# Patient Record
Sex: Female | Born: 1950 | Race: White | Hispanic: No | Smoking: Never smoker
Health system: Southern US, Community
[De-identification: ages and names within clinical notes are randomized; demographics above are authoritative.]

## PROBLEM LIST (undated history)

## (undated) DIAGNOSIS — Z973 Presence of spectacles and contact lenses: Secondary | ICD-10-CM

## (undated) DIAGNOSIS — F32A Depression, unspecified: Secondary | ICD-10-CM

## (undated) DIAGNOSIS — Z8489 Family history of other specified conditions: Secondary | ICD-10-CM

## (undated) DIAGNOSIS — R7303 Prediabetes: Secondary | ICD-10-CM

## (undated) DIAGNOSIS — C439 Malignant melanoma of skin, unspecified: Secondary | ICD-10-CM

## (undated) DIAGNOSIS — I10 Essential (primary) hypertension: Secondary | ICD-10-CM

## (undated) DIAGNOSIS — F329 Major depressive disorder, single episode, unspecified: Secondary | ICD-10-CM

## (undated) DIAGNOSIS — M8580 Other specified disorders of bone density and structure, unspecified site: Secondary | ICD-10-CM

## (undated) DIAGNOSIS — K219 Gastro-esophageal reflux disease without esophagitis: Secondary | ICD-10-CM

## (undated) DIAGNOSIS — M1990 Unspecified osteoarthritis, unspecified site: Secondary | ICD-10-CM

## (undated) DIAGNOSIS — C801 Malignant (primary) neoplasm, unspecified: Secondary | ICD-10-CM

## (undated) DIAGNOSIS — N950 Postmenopausal bleeding: Secondary | ICD-10-CM

## (undated) DIAGNOSIS — N390 Urinary tract infection, site not specified: Secondary | ICD-10-CM

## (undated) DIAGNOSIS — G4733 Obstructive sleep apnea (adult) (pediatric): Secondary | ICD-10-CM

## (undated) HISTORY — PX: COLONOSCOPY: SHX174

## (undated) HISTORY — DX: Other specified disorders of bone density and structure, unspecified site: M85.80

## (undated) HISTORY — DX: Malignant (primary) neoplasm, unspecified: C80.1

## (undated) HISTORY — DX: Major depressive disorder, single episode, unspecified: F32.9

## (undated) HISTORY — PX: TONSILLECTOMY: SUR1361

## (undated) HISTORY — DX: Malignant melanoma of skin, unspecified: C43.9

## (undated) HISTORY — DX: Depression, unspecified: F32.A

## (undated) HISTORY — PX: REFRACTIVE SURGERY: SHX103

## (undated) HISTORY — DX: Essential (primary) hypertension: I10

## (undated) HISTORY — DX: Obstructive sleep apnea (adult) (pediatric): G47.33

## (undated) HISTORY — PX: BREAST BIOPSY: SHX20

## (undated) HISTORY — DX: Gastro-esophageal reflux disease without esophagitis: K21.9

## (undated) HISTORY — PX: WISDOM TOOTH EXTRACTION: SHX21

## (undated) HISTORY — DX: Prediabetes: R73.03

## (undated) HISTORY — DX: Urinary tract infection, site not specified: N39.0

---

## 1999-09-25 ENCOUNTER — Other Ambulatory Visit: Admission: RE | Admit: 1999-09-25 | Discharge: 1999-09-25 | Payer: Self-pay | Admitting: *Deleted

## 2001-04-30 ENCOUNTER — Other Ambulatory Visit: Admission: RE | Admit: 2001-04-30 | Discharge: 2001-04-30 | Payer: Self-pay | Admitting: *Deleted

## 2002-05-28 ENCOUNTER — Other Ambulatory Visit: Admission: RE | Admit: 2002-05-28 | Discharge: 2002-05-28 | Payer: Self-pay | Admitting: *Deleted

## 2002-08-31 LAB — HM COLONOSCOPY: HM Colonoscopy: NEGATIVE

## 2003-06-02 ENCOUNTER — Other Ambulatory Visit: Admission: RE | Admit: 2003-06-02 | Discharge: 2003-06-02 | Payer: Self-pay | Admitting: Obstetrics and Gynecology

## 2004-01-08 ENCOUNTER — Emergency Department (HOSPITAL_COMMUNITY): Admission: EM | Admit: 2004-01-08 | Discharge: 2004-01-08 | Payer: Self-pay | Admitting: Emergency Medicine

## 2004-07-05 ENCOUNTER — Other Ambulatory Visit: Admission: RE | Admit: 2004-07-05 | Discharge: 2004-07-05 | Payer: Self-pay | Admitting: Obstetrics and Gynecology

## 2004-10-29 HISTORY — PX: MELANOMA EXCISION: SHX5266

## 2004-12-22 ENCOUNTER — Encounter: Admission: RE | Admit: 2004-12-22 | Discharge: 2004-12-22 | Payer: Self-pay | Admitting: *Deleted

## 2005-06-22 ENCOUNTER — Encounter: Admission: RE | Admit: 2005-06-22 | Discharge: 2005-06-22 | Payer: Self-pay | Admitting: *Deleted

## 2005-07-09 ENCOUNTER — Other Ambulatory Visit: Admission: RE | Admit: 2005-07-09 | Discharge: 2005-07-09 | Payer: Self-pay | Admitting: Obstetrics and Gynecology

## 2005-07-20 ENCOUNTER — Encounter: Admission: RE | Admit: 2005-07-20 | Discharge: 2005-07-20 | Payer: Self-pay | Admitting: Internal Medicine

## 2005-09-12 ENCOUNTER — Encounter: Admission: RE | Admit: 2005-09-12 | Discharge: 2005-09-12 | Payer: Self-pay | Admitting: Dermatology

## 2006-08-01 ENCOUNTER — Other Ambulatory Visit: Admission: RE | Admit: 2006-08-01 | Discharge: 2006-08-01 | Payer: Self-pay | Admitting: Obstetrics & Gynecology

## 2006-08-07 ENCOUNTER — Encounter: Admission: RE | Admit: 2006-08-07 | Discharge: 2006-08-07 | Payer: Self-pay | Admitting: Obstetrics and Gynecology

## 2007-08-06 ENCOUNTER — Other Ambulatory Visit: Admission: RE | Admit: 2007-08-06 | Discharge: 2007-08-06 | Payer: Self-pay | Admitting: Obstetrics and Gynecology

## 2007-08-11 ENCOUNTER — Encounter: Admission: RE | Admit: 2007-08-11 | Discharge: 2007-08-11 | Payer: Self-pay | Admitting: Obstetrics and Gynecology

## 2008-08-23 ENCOUNTER — Encounter: Admission: RE | Admit: 2008-08-23 | Discharge: 2008-08-23 | Payer: Self-pay | Admitting: Obstetrics and Gynecology

## 2008-09-02 ENCOUNTER — Encounter: Admission: RE | Admit: 2008-09-02 | Discharge: 2008-09-02 | Payer: Self-pay | Admitting: Obstetrics and Gynecology

## 2008-09-28 ENCOUNTER — Other Ambulatory Visit: Admission: RE | Admit: 2008-09-28 | Discharge: 2008-09-28 | Payer: Self-pay | Admitting: Obstetrics and Gynecology

## 2008-12-14 ENCOUNTER — Encounter: Admission: RE | Admit: 2008-12-14 | Discharge: 2008-12-14 | Payer: Self-pay | Admitting: Obstetrics and Gynecology

## 2009-09-14 ENCOUNTER — Encounter: Admission: RE | Admit: 2009-09-14 | Discharge: 2009-09-14 | Payer: Self-pay | Admitting: Obstetrics and Gynecology

## 2010-11-19 ENCOUNTER — Encounter: Payer: Self-pay | Admitting: Obstetrics and Gynecology

## 2010-11-21 ENCOUNTER — Other Ambulatory Visit: Payer: Self-pay | Admitting: Obstetrics and Gynecology

## 2010-11-21 DIAGNOSIS — Z1239 Encounter for other screening for malignant neoplasm of breast: Secondary | ICD-10-CM

## 2010-11-21 DIAGNOSIS — Z1231 Encounter for screening mammogram for malignant neoplasm of breast: Secondary | ICD-10-CM

## 2010-12-01 LAB — HM MAMMOGRAPHY: HM Mammogram: NEGATIVE

## 2010-12-07 ENCOUNTER — Ambulatory Visit
Admission: RE | Admit: 2010-12-07 | Discharge: 2010-12-07 | Disposition: A | Discharge status: Home / Self Care | Payer: BC Managed Care – PPO | Source: Ambulatory Visit | Attending: Obstetrics and Gynecology | Admitting: Obstetrics and Gynecology

## 2010-12-07 DIAGNOSIS — Z1231 Encounter for screening mammogram for malignant neoplasm of breast: Secondary | ICD-10-CM

## 2012-01-30 LAB — HM PAP SMEAR: HM Pap smear: NEGATIVE

## 2012-02-04 ENCOUNTER — Other Ambulatory Visit: Payer: Self-pay | Admitting: Obstetrics and Gynecology

## 2012-02-04 DIAGNOSIS — Z1231 Encounter for screening mammogram for malignant neoplasm of breast: Secondary | ICD-10-CM

## 2012-02-04 DIAGNOSIS — M8580 Other specified disorders of bone density and structure, unspecified site: Secondary | ICD-10-CM

## 2012-02-27 ENCOUNTER — Other Ambulatory Visit: Payer: BC Managed Care – PPO

## 2012-02-27 ENCOUNTER — Ambulatory Visit: Payer: BC Managed Care – PPO

## 2012-03-31 ENCOUNTER — Ambulatory Visit (INDEPENDENT_AMBULATORY_CARE_PROVIDER_SITE_OTHER): Payer: BC Managed Care – PPO | Admitting: Family Medicine

## 2012-03-31 ENCOUNTER — Encounter: Payer: Self-pay | Admitting: Family Medicine

## 2012-03-31 VITALS — BP 160/80 | HR 72 | Temp 98.7°F | Resp 12 | Ht 65.25 in | Wt 148.0 lb

## 2012-03-31 DIAGNOSIS — R030 Elevated blood-pressure reading, without diagnosis of hypertension: Secondary | ICD-10-CM

## 2012-03-31 DIAGNOSIS — Z8659 Personal history of other mental and behavioral disorders: Secondary | ICD-10-CM | POA: Insufficient documentation

## 2012-03-31 DIAGNOSIS — IMO0001 Reserved for inherently not codable concepts without codable children: Secondary | ICD-10-CM

## 2012-03-31 DIAGNOSIS — I10 Essential (primary) hypertension: Secondary | ICD-10-CM | POA: Insufficient documentation

## 2012-03-31 DIAGNOSIS — H81319 Aural vertigo, unspecified ear: Secondary | ICD-10-CM

## 2012-03-31 DIAGNOSIS — H8110 Benign paroxysmal vertigo, unspecified ear: Secondary | ICD-10-CM

## 2012-03-31 DIAGNOSIS — C439 Malignant melanoma of skin, unspecified: Secondary | ICD-10-CM | POA: Insufficient documentation

## 2012-03-31 NOTE — Progress Notes (Signed)
  Subjective:    Patient ID: Lori Harvey, female    DOB: [birth year], 61 y.o.   MRN: [id number]  HPI  Patient is seen to establish care. She sees gynecologist regularly for physicals. Past medical history reviewed. She's had previous episode or depression several years ago currently untreated and stable. She has history of borderline elevated blood pressure readings but mostly below 130/80 by home readings. Never treated with medication. She had recurrent episodes of vertigo for several years.  This is usually very transient. Patient has some nausea but no vomiting. No hearing changes. No tinnitus.  Patient has history of melanoma stage II right calf 2006. Regular dermatology followup.  Patient is widowed. Her husband passed away from leukemia complications 2 years ago. Nonsmoker. Occasional alcohol use. Patient retired 2006. Colonoscopy screening 2003. Tetanus 2004. Shingles vaccine 2 years ago.  Family history significant for followup alcoholism. Mother had breast cancer and history of stroke  Past Medical History  Diagnosis Date  . Cancer     right leg, upper thigh  . Depression   . Hypertension   . UTI (lower urinary tract infection)    History reviewed. No pertinent past surgical history.  reports that she has never smoked. She does not have any smokeless tobacco history on file. Her alcohol and drug histories not on file. family history includes Alcohol abuse in her father; Cancer in her mother; and Stroke in her mother. Allergies  Allergen Reactions  . Keflex (Cephalexin)     Nausea, hives      Review of Systems  Constitutional: Negative for appetite change and unexpected weight change.  Respiratory: Negative for cough and shortness of breath.   Cardiovascular: Negative for chest pain, palpitations and leg swelling.  Gastrointestinal: Negative for abdominal pain.  Genitourinary: Negative for dysuria.  Neurological: Negative for dizziness, weakness and headaches.    Hematological: Negative for adenopathy. Does not bruise/bleed easily.  Psychiatric/Behavioral: Negative for confusion and dysphoric mood.       Objective:   Physical Exam  Constitutional: She is oriented to person, place, and time. She appears well-developed and well-nourished. No distress.  HENT:  Right Ear: External ear normal.  Left Ear: External ear normal.  Mouth/Throat: Oropharynx is clear and moist.  Neck: Neck supple.       No carotid bruits  Cardiovascular: Normal rate and regular rhythm.   No murmur heard. Pulmonary/Chest: Effort normal and breath sounds normal. No respiratory distress. She has no wheezes. She has no rales.  Musculoskeletal: She exhibits no edema.  Neurological: She is alert and oriented to person, place, and time.          Assessment & Plan:  #1 prior history of melanoma. Followed closely by dermatology #2 history of recurrent vertigo. No concerning red flag symptoms. Currently asymptomatic. Consider vestibular rehabilitation for any further future occurrences #3 remote history of depression currently stable off medications #4 history of borderline elevated blood pressure mostly below 130 /80 by home readings

## 2012-04-08 ENCOUNTER — Encounter: Payer: Self-pay | Admitting: Family Medicine

## 2012-04-08 ENCOUNTER — Ambulatory Visit (INDEPENDENT_AMBULATORY_CARE_PROVIDER_SITE_OTHER): Payer: BC Managed Care – PPO | Admitting: Family Medicine

## 2012-04-08 VITALS — BP 140/82 | Temp 98.6°F | Wt 148.0 lb

## 2012-04-08 DIAGNOSIS — R05 Cough: Secondary | ICD-10-CM

## 2012-04-08 DIAGNOSIS — H109 Unspecified conjunctivitis: Secondary | ICD-10-CM

## 2012-04-08 DIAGNOSIS — R059 Cough, unspecified: Secondary | ICD-10-CM

## 2012-04-08 MED ORDER — POLYMYXIN B-TRIMETHOPRIM 10000-0.1 UNIT/ML-% OP SOLN: 2.0000 [drp] | OPHTHALMIC | Status: AC

## 2012-04-08 MED ORDER — LEVOFLOXACIN 500 MG PO TABS: 500.0000 mg | ORAL_TABLET | Freq: Every day | ORAL | Status: AC

## 2012-04-08 NOTE — Patient Instructions (Signed)
Conjunctivitis Conjunctivitis is commonly called "pink eye." Conjunctivitis can be caused by bacterial or viral infection, allergies, or injuries. There is usually redness of the lining of the eye, itching, discomfort, and sometimes discharge. There may be deposits of matter along the eyelids. A viral infection usually causes a watery discharge, while a bacterial infection causes a yellowish, thick discharge. Pink eye is very contagious and spreads by direct contact. You may be given antibiotic eyedrops as part of your treatment. Before using your eye medicine, remove all drainage from the eye by washing gently with warm water and cotton balls. Continue to use the medication until you have awakened 2 mornings in a row without discharge from the eye. Do not rub your eye. This increases the irritation and helps spread infection. Use separate towels from other household members. Wash your hands with soap and water before and after touching your eyes. Use cold compresses to reduce pain and sunglasses to relieve irritation from light. Do not wear contact lenses or wear eye makeup until the infection is gone. SEEK MEDICAL CARE IF:   Your symptoms are not better after 3 days of treatment.   You have increased pain or trouble seeing.   The outer eyelids become very red or swollen.  Document Released: 11/22/2004 Document Revised: 10/04/2011 Document Reviewed: 10/15/2005 Exit[employer] Patient Information 2012 [employer]. 

## 2012-04-08 NOTE — Progress Notes (Signed)
  Subjective:    Patient ID: Lori Harvey, female    DOB: May 17, 1951, 61 y.o.   MRN: [id number]  HPI  Upper respiratory symptoms. Patient had illness back in April very similar with sinus congestion /cough. She went to urgent care was given Zithromax and steroid injection. She still is not quite clear. She has had productive cough but no fever. She has new symptom of both eyes matting and red over the past couple days. No visual changes.   Review of Systems  Constitutional: Negative for fever and chills.  HENT: Positive for congestion and sinus pressure.   Eyes: Positive for discharge and redness. Negative for photophobia, pain and visual disturbance.  Respiratory: Positive for cough. Negative for wheezing.        Objective:   Physical Exam  Constitutional: She appears well-developed and well-nourished.  HENT:  Right Ear: External ear normal.  Left Ear: External ear normal.  Mouth/Throat: Oropharynx is clear and moist.  Eyes:       Conjunctiva are erythematous bilaterally.  Neck: Neck supple.  Cardiovascular: Normal rate and regular rhythm.   Pulmonary/Chest: Effort normal and breath sounds normal. No respiratory distress. She has no wheezes. She has no rales.  Lymphadenopathy:    She has no cervical adenopathy.          Assessment & Plan:  #1 bacterial conjunctivitis. Bilateral. Warm compresses. Polytrim ophthalmic drops every 4 hours while awake #2 cough. Question persistent sinusitis versus viral process. Continue over-the-counter medications as Mucinex. No antibiotics recommend this time but if she has any fever or persistent or worsening symptoms start Levaquin 500 milligrams once daily for 7 days

## 2012-06-18 ENCOUNTER — Encounter: Payer: Self-pay | Admitting: Internal Medicine

## 2012-08-13 ENCOUNTER — Encounter: Payer: Self-pay | Admitting: Internal Medicine

## 2012-09-17 ENCOUNTER — Ambulatory Visit (AMBULATORY_SURGERY_CENTER): Payer: BC Managed Care – PPO | Admitting: *Deleted

## 2012-09-17 VITALS — Ht 66.5 in | Wt 151.0 lb

## 2012-09-17 DIAGNOSIS — Z1211 Encounter for screening for malignant neoplasm of colon: Secondary | ICD-10-CM

## 2012-09-17 MED ORDER — MOVIPREP 100 G PO SOLR: ORAL | Status: DC

## 2012-09-18 ENCOUNTER — Encounter: Payer: Self-pay | Admitting: Internal Medicine

## 2012-10-07 ENCOUNTER — Encounter: Payer: BC Managed Care – PPO | Admitting: Internal Medicine

## 2012-10-15 ENCOUNTER — Encounter: Payer: BC Managed Care – PPO | Admitting: Internal Medicine

## 2012-10-15 ENCOUNTER — Ambulatory Visit
Admission: RE | Admit: 2012-10-15 | Discharge: 2012-10-15 | Disposition: A | Discharge status: Home / Self Care | Payer: BC Managed Care – PPO | Source: Ambulatory Visit | Attending: Obstetrics and Gynecology | Admitting: Obstetrics and Gynecology

## 2012-10-15 DIAGNOSIS — Z1231 Encounter for screening mammogram for malignant neoplasm of breast: Secondary | ICD-10-CM

## 2012-10-15 DIAGNOSIS — M8580 Other specified disorders of bone density and structure, unspecified site: Secondary | ICD-10-CM

## 2012-10-28 ENCOUNTER — Other Ambulatory Visit: Payer: Self-pay | Admitting: Obstetrics and Gynecology

## 2012-10-28 DIAGNOSIS — R928 Other abnormal and inconclusive findings on diagnostic imaging of breast: Secondary | ICD-10-CM

## 2012-10-31 ENCOUNTER — Ambulatory Visit
Admission: RE | Admit: 2012-10-31 | Discharge: 2012-10-31 | Disposition: A | Discharge status: Home / Self Care | Payer: BC Managed Care – PPO | Source: Ambulatory Visit | Attending: Obstetrics and Gynecology | Admitting: Obstetrics and Gynecology

## 2012-10-31 DIAGNOSIS — R928 Other abnormal and inconclusive findings on diagnostic imaging of breast: Secondary | ICD-10-CM

## 2012-11-04 ENCOUNTER — Telehealth: Payer: Self-pay | Admitting: Internal Medicine

## 2012-11-04 NOTE — Telephone Encounter (Signed)
Pt's questions about diet changes were answered.

## 2012-11-11 ENCOUNTER — Ambulatory Visit (AMBULATORY_SURGERY_CENTER): Payer: BC Managed Care – PPO | Admitting: Internal Medicine

## 2012-11-11 ENCOUNTER — Encounter: Payer: Self-pay | Admitting: Internal Medicine

## 2012-11-11 VITALS — BP 127/81 | HR 65 | Temp 97.3°F | Resp 24 | Ht 66.6 in | Wt 151.0 lb

## 2012-11-11 DIAGNOSIS — Z1211 Encounter for screening for malignant neoplasm of colon: Secondary | ICD-10-CM

## 2012-11-11 MED ORDER — SODIUM CHLORIDE 0.9 % IV SOLN: 500.0000 mL | INTRAVENOUS | Status: DC

## 2012-11-11 NOTE — Progress Notes (Signed)
Stable to RR 

## 2012-11-11 NOTE — Progress Notes (Signed)
Patient did not experience any of the following events: a burn prior to discharge; a fall within the facility; wrong site/side/patient/procedure/implant event; or a hospital transfer or hospital admission upon discharge from the facility. (G8907) Patient did not have preoperative order for IV antibiotic SSI prophylaxis. (G8918)  

## 2012-11-11 NOTE — Patient Instructions (Addendum)

## 2012-11-11 NOTE — Op Note (Signed)
 [facility] [address] [place], [address]   COLONOSCOPY PROCEDURE REPORT  PATIENT: Lori Harvey, Lori Harvey  MR#: [id number] BIRTHDATE: Feb 19, 1951 , 61  yrs. old GENDER: Female ENDOSCOPIST: Hart Carwin, MD REFERRED BY:  Evelena Peat, M.D. PROCEDURE DATE:  11/11/2012 PROCEDURE:   Colonoscopy, screening ASA CLASS:   Class II INDICATIONS:Average risk patient for colon cancer and normal colonoscopy in 2003. MEDICATIONS: MAC sedation, administered by CRNA and propofol (Diprivan) 300mg  IV  DESCRIPTION OF PROCEDURE:   After the risks and benefits and of the procedure were explained, informed consent was obtained.  A digital rectal exam revealed no abnormalities of the rectum.    The LB [id number]-[id number]  endoscope was introduced through the anus and advanced to the cecum, which was identified by both the appendix and ileocecal valve .  The quality of the prep was excellent, using MoviPrep .  The instrument was then slowly withdrawn as the colon was fully examined.     COLON FINDINGS: A normal appearing cecum, ileocecal valve, and appendiceal orifice were identified.  The ascending, hepatic flexure, transverse, splenic flexure, descending, sigmoid colon and rectum appeared unremarkable.  No polyps or cancers were seen. Retroflexed views revealed no abnormalities.     The scope was then withdrawn from the patient and the procedure completed.  COMPLICATIONS: There were no complications. ENDOSCOPIC IMPRESSION: Normal colon  RECOMMENDATIONS: High fiber diet   REPEAT EXAM: In 10 year(s)  for Colonoscopy.  cc:  _______________________________ eSignedHart Carwin, MD 11/11/2012 4:16 PM

## 2012-11-11 NOTE — Progress Notes (Signed)
NO EGG OR SOY ALLERGY. EWM 

## 2012-11-12 ENCOUNTER — Telehealth: Payer: Self-pay | Admitting: *Deleted

## 2012-11-12 NOTE — Telephone Encounter (Signed)
  Follow up Call-  Call back number 11/11/2012  Post procedure Call Back phone  # [phone]  Permission to leave phone message Yes     Patient questions:  Do you have a fever, pain , or abdominal swelling? no Pain Score  0 *  Have you tolerated food without any problems? yes  Have you been able to return to your normal activities? yes  Do you have any questions about your discharge instructions: Diet   no Medications  no Follow up visit  no  Do you have questions or concerns about your Care? no  Actions: * If pain score is 4 or above: No action needed, pain <4.

## 2013-01-21 ENCOUNTER — Encounter: Payer: Self-pay | Admitting: *Deleted

## 2013-02-05 ENCOUNTER — Ambulatory Visit (INDEPENDENT_AMBULATORY_CARE_PROVIDER_SITE_OTHER): Payer: BC Managed Care – PPO | Admitting: Nurse Practitioner

## 2013-02-05 ENCOUNTER — Encounter: Payer: Self-pay | Admitting: Nurse Practitioner

## 2013-02-05 VITALS — BP 120/70 | HR 60 | Resp 16 | Ht 66.0 in | Wt 150.0 lb

## 2013-02-05 DIAGNOSIS — G4700 Insomnia, unspecified: Secondary | ICD-10-CM

## 2013-02-05 DIAGNOSIS — Z0000 Encounter for general adult medical examination without abnormal findings: Secondary | ICD-10-CM

## 2013-02-05 DIAGNOSIS — Z23 Encounter for immunization: Secondary | ICD-10-CM

## 2013-02-05 DIAGNOSIS — Z01419 Encounter for gynecological examination (general) (routine) without abnormal findings: Secondary | ICD-10-CM

## 2013-02-05 LAB — TSH: TSH: 2.772 u[IU]/mL (ref 0.350–4.500)

## 2013-02-05 LAB — COMPREHENSIVE METABOLIC PANEL
AST: 14 U/L (ref 0–37)
Albumin: 4.2 g/dL (ref 3.5–5.2)
BUN: 15 mg/dL (ref 6–23)
Calcium: 9.3 mg/dL (ref 8.4–10.5)
Chloride: 103 mEq/L (ref 96–112)
Glucose, Bld: 99 mg/dL (ref 70–99)
Potassium: 4.5 mEq/L (ref 3.5–5.3)
Sodium: 139 mEq/L (ref 135–145)
Total Protein: 6.7 g/dL (ref 6.0–8.3)

## 2013-02-05 LAB — LIPID PANEL
Cholesterol: 239 mg/dL — ABNORMAL HIGH (ref 0–200)
VLDL: 18 mg/dL (ref 0–40)

## 2013-02-05 LAB — POCT URINALYSIS DIPSTICK
Bilirubin, UA: NEGATIVE
Blood, UA: NEGATIVE
Glucose, UA: NEGATIVE

## 2013-02-05 LAB — HEMOGLOBIN A1C: Mean Plasma Glucose: 111 mg/dL (ref <117)

## 2013-02-05 MED ORDER — ZOLPIDEM TARTRATE 10 MG PO TABS: 10.0000 mg | ORAL_TABLET | Freq: Every evening | ORAL | Status: DC | PRN

## 2013-02-05 NOTE — Progress Notes (Signed)
62 y.o. Widowed Caucasian Fe here for annual exam.  In general since husbands untimely death she has felt despondent at times. She tries to be very encouraging to other widows and has helped to create environments where they can meet. She often times leeds the group. She has been in counseling with Hospice which has helped.  She now feels the need to seek other counseling to see if that can help better. Her and Elijah Birk were very close.  She is unsure if not feeling 'well' is emotional or physical. Anxious a lot of the time. Sleep is better.  No LMP recorded. Patient is postmenopausal.          Sexually active: no  The current method of family planning is none.    Exercising: no  The patient does not participate in regular exercise at present. Smoker:  no  Health Maintenance: Pap:  2013 normal with neg HR HPV MMG: 10/15/12 ? Mass left breast and recheck in 6 months Colonoscopy: 10/2012 BMD:  10/15/12 osteopenia TDaP:  2004 Labs: Hgb 14.0, U/A normal   reports that she has never smoked. She has never used smokeless tobacco. She reports that she drinks about 0.6 ounces of alcohol per week. She reports that she does not use illicit drugs.  Past Medical History  Diagnosis Date  . Depression   . UTI (lower urinary tract infection)   . Cancer     right leg, upper thigh AND CALF melanoma  . Hypertension     Patient denies    Past Surgical History  Procedure Laterality Date  . Melanoma excision  2006    right thigh and calf  . Colonoscopy    . Tonsillectomy    . Wisdom tooth extraction    . Refractive surgery      Current Outpatient Prescriptions  Medication Sig Dispense Refill  . doxycycline (VIBRAMYCIN) 50 MG capsule Take 50 mg by mouth 1 day or 1 dose.      . Calcium 1200-1000 MG-UNIT CHEW Chew by mouth.      . Multiple Vitamin (MULTIVITAMIN) capsule Take 1 capsule by mouth daily.       No current facility-administered medications for this visit.    Family History  Problem  Relation Age of Onset  . Cancer Mother     breast  . Stroke Mother   . Alcohol abuse Father   . Colon cancer Neg Hx     ROS:  Pertinent items are noted in HPI.  Otherwise, a comprehensive ROS was negative.  Exam:   BP 120/70  Pulse 60  Resp 16  Ht 5\' 6"  (1.676 m)  Wt 150 lb (68.04 kg)  BMI 24.22 kg/m2  Height: 5\' 6"  (167.6 cm)  Ht Readings from Last 3 Encounters:  02/05/13 5\' 6"  (1.676 m)  11/11/12 5' 6.6" (1.692 m)  09/17/12 5' 6.5" (1.689 m)    General appearance: alert, cooperative and appears stated age Head: Normocephalic, without obvious abnormality, atraumatic Neck: no adenopathy, supple, symmetrical, trachea midline and thyroid normal to inspection and palpation Lungs: clear to auscultation bilaterally Breasts: normal appearance, no masses or tenderness Heart: regular rate and rhythm Abdomen: soft, non-tender; no masses,  no organomegaly Extremities: extremities normal, atraumatic, no cyanosis or edema Skin: Skin color, texture, turgor normal. No rashes or lesions Lymph nodes: Cervical, supraclavicular, and axillary nodes normal. No abnormal inguinal nodes palpated Neurologic: Grossly normal   Pelvic: External genitalia:  no lesions  Urethra:  normal appearing urethra with no masses, tenderness or lesions              Bartholin's and Skene's: normal                 Vagina: normal appearing vagina with atrophy and no discharge, no lesions              Cervix: anteverted              Pap taken: no Bimanual Exam:  Uterus:  normal size, contour, position, consistency, mobility, non-tender              Adnexa: normal adnexa               Rectovaginal: Confirms               Anus:  normal sphincter tone, no lesions  A:  Well Woman with normal exam  Post menopausal  History of abnormal Mammo 10/31/12 with anechoic mass left breast - for repeat in 6 months.  Osteopenia of spine, borderline porosis at hip  P:   Mammogram repeat J/2014 diagnostic  pap  smear as per guidelines  return annually or prn  Business Card for Berniece Andreas, LCSW  An After Visit Summary was printed and given to the patient.

## 2013-02-05 NOTE — Patient Instructions (Addendum)

## 2013-02-06 LAB — VITAMIN D 25 HYDROXY (VIT D DEFICIENCY, FRACTURES): Vit D, 25-Hydroxy: 25 ng/mL — ABNORMAL LOW (ref 30–89)

## 2013-02-07 NOTE — Progress Notes (Signed)
Encounter reviewed by Dr. Brook Silva.  

## 2013-02-16 ENCOUNTER — Encounter: Payer: Self-pay | Admitting: Nurse Practitioner

## 2013-02-17 ENCOUNTER — Other Ambulatory Visit: Payer: Self-pay | Admitting: Nurse Practitioner

## 2013-02-17 ENCOUNTER — Other Ambulatory Visit: Payer: Self-pay | Admitting: *Deleted

## 2013-02-17 DIAGNOSIS — G4700 Insomnia, unspecified: Secondary | ICD-10-CM

## 2013-02-17 MED ORDER — ZOLPIDEM TARTRATE 10 MG PO TABS: 10.0000 mg | ORAL_TABLET | Freq: Every evening | ORAL | Status: DC | PRN

## 2013-09-03 ENCOUNTER — Other Ambulatory Visit: Payer: Self-pay

## 2013-12-26 ENCOUNTER — Telehealth: Payer: Self-pay | Admitting: Emergency Medicine

## 2013-12-26 NOTE — Telephone Encounter (Signed)
This patient is overdue for a 6 month follow up Mammogram at the [facility]. Please contact patient to contact the [facility] to schedule. Thank you.

## 2013-12-30 NOTE — Telephone Encounter (Signed)
I have attempted to contact this patient by phone with the following results: left message to return my call on answering machine (home).  

## 2014-01-01 NOTE — Telephone Encounter (Signed)
Returning a call Colletta [place].

## 2014-01-05 NOTE — Telephone Encounter (Signed)
I have attempted to contact this patient by phone with the following results: left message to return my call on answering machine (mobile).  

## 2014-01-07 ENCOUNTER — Other Ambulatory Visit: Payer: Self-pay | Admitting: Nurse Practitioner

## 2014-01-07 ENCOUNTER — Other Ambulatory Visit: Payer: Self-pay | Admitting: Obstetrics and Gynecology

## 2014-01-07 DIAGNOSIS — N6320 Unspecified lump in the left breast, unspecified quadrant: Secondary | ICD-10-CM

## 2014-01-07 NOTE — Telephone Encounter (Signed)
We have documented she knows is overdue so I think ok to change recall to 4/15.  Thanks.  Can remove old recall.

## 2014-01-07 NOTE — Telephone Encounter (Signed)
I spoke to the patient and she states she knows she is behind in her follow up and is now at the one year mark.  She verified her AEX appt date/time here in April and states she will try to get her mammogram prior to that appt. Pt very appreciative that we called her to remind her of this.

## 2014-01-07 NOTE — Telephone Encounter (Signed)
Dr Sabra Heck, do you want to change recall date till A[facility] or send letter?

## 2014-01-08 NOTE — Telephone Encounter (Signed)
Encounter closed

## 2014-01-08 NOTE — Telephone Encounter (Signed)
Previous recall completed. New 08 recall entered for 02-25-14.

## 2014-01-20 ENCOUNTER — Other Ambulatory Visit: Payer: Self-pay

## 2014-01-20 ENCOUNTER — Other Ambulatory Visit: Payer: Self-pay | Admitting: Nurse Practitioner

## 2014-01-20 ENCOUNTER — Ambulatory Visit
Admission: RE | Admit: 2014-01-20 | Discharge: 2014-01-20 | Disposition: A | Discharge status: Home / Self Care | Payer: BC Managed Care – PPO | Source: Ambulatory Visit | Attending: Nurse Practitioner | Admitting: Nurse Practitioner

## 2014-01-20 DIAGNOSIS — N6320 Unspecified lump in the left breast, unspecified quadrant: Secondary | ICD-10-CM

## 2014-01-29 ENCOUNTER — Ambulatory Visit
Admission: RE | Admit: 2014-01-29 | Discharge: 2014-01-29 | Disposition: A | Discharge status: Home / Self Care | Payer: BC Managed Care – PPO | Source: Ambulatory Visit | Attending: Nurse Practitioner | Admitting: Nurse Practitioner

## 2014-01-29 ENCOUNTER — Other Ambulatory Visit: Payer: Self-pay | Admitting: Nurse Practitioner

## 2014-01-29 DIAGNOSIS — N6320 Unspecified lump in the left breast, unspecified quadrant: Secondary | ICD-10-CM

## 2014-02-09 ENCOUNTER — Encounter: Payer: Self-pay | Admitting: Nurse Practitioner

## 2014-02-11 ENCOUNTER — Ambulatory Visit (INDEPENDENT_AMBULATORY_CARE_PROVIDER_SITE_OTHER): Payer: BC Managed Care – PPO | Admitting: Nurse Practitioner

## 2014-02-11 ENCOUNTER — Encounter: Payer: Self-pay | Admitting: Nurse Practitioner

## 2014-02-11 VITALS — BP 150/60 | HR 80 | Resp 16 | Ht 66.0 in | Wt 155.0 lb

## 2014-02-11 DIAGNOSIS — M8580 Other specified disorders of bone density and structure, unspecified site: Secondary | ICD-10-CM | POA: Insufficient documentation

## 2014-02-11 DIAGNOSIS — M899 Disorder of bone, unspecified: Secondary | ICD-10-CM

## 2014-02-11 DIAGNOSIS — Z01419 Encounter for gynecological examination (general) (routine) without abnormal findings: Secondary | ICD-10-CM

## 2014-02-11 DIAGNOSIS — H8110 Benign paroxysmal vertigo, unspecified ear: Secondary | ICD-10-CM | POA: Insufficient documentation

## 2014-02-11 DIAGNOSIS — M949 Disorder of cartilage, unspecified: Secondary | ICD-10-CM

## 2014-02-11 DIAGNOSIS — Z0000 Encounter for general adult medical examination without abnormal findings: Secondary | ICD-10-CM

## 2014-02-11 DIAGNOSIS — Z23 Encounter for immunization: Secondary | ICD-10-CM

## 2014-02-11 LAB — POCT URINALYSIS DIPSTICK
BILIRUBIN UA: NEGATIVE
Blood, UA: NEGATIVE
Glucose, UA: NEGATIVE
KETONES UA: NEGATIVE
LEUKOCYTES UA: NEGATIVE
Nitrite, UA: NEGATIVE
Protein, UA: NEGATIVE
Urobilinogen, UA: NEGATIVE
pH, UA: 5

## 2014-02-11 LAB — HEMOGLOBIN, FINGERSTICK: Hemoglobin, fingerstick: 13.7 g/dL (ref 12.0–16.0)

## 2014-02-11 MED ORDER — ZOLPIDEM TARTRATE 10 MG PO TABS: 10.0000 mg | ORAL_TABLET | Freq: Every evening | ORAL | Status: DC | PRN

## 2014-02-11 MED ORDER — ALPRAZOLAM 1 MG PO TABS: ORAL_TABLET | ORAL | Status: DC

## 2014-02-11 MED ORDER — DIAZEPAM 10 MG PO TABS: 10.0000 mg | ORAL_TABLET | Freq: Four times a day (QID) | ORAL | Status: DC | PRN

## 2014-02-11 NOTE — Patient Instructions (Signed)

## 2014-02-11 NOTE — Progress Notes (Signed)
Patient ID: Lori Harvey, female   DOB: Sep 02, 1951, 63 y.o.   MRN: [id number] 62 y.o. [id number] Widowed Caucasian Fe here for annual exam.  BP diary with recent readings 137/77; 132/81; 130/79 over past few weeks. Some recent vertigo symptoms X 3 days 2 weeks ago.  Also problems with seasomal allergies and taking 1/2 Allegra prn.  Went to [facility] for vertigo conference. Pharmacist suggest a RX for Valium and Bonine to use as a prn.  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  She has also considered moving and down sizing to a smaller place.  But that again brings up all the anxiety and insomnia with the decision to move from the home that she shared with her deceased husband.  She wants to have refill on Ambien and Xanax which she uses so rarely.  She is aware of my concerns and is encouraged not to use on a regular basis.                                                                                          Patient's last menstrual period was 08/29/2002.          Sexually active: no  The current method of family planning is none and abstinence.    Exercising: yes  Home exercise routine includes walking 4-5 miles 3-4 times per week. Smoker:  no  Health Maintenance: Pap:  2013 WNL, neg HR HPV MMG: 01/2014, biopsy, return to routine screening Colonoscopy: 10/2013, repeat in 10 years BMD:  10/15/12 osteopenia TDaP:  2004 Labs:  HB:  13.7 Urine:  Negative     reports that she has never smoked. She has never used smokeless tobacco. She reports that she drinks about .6 ounces of alcohol per week. She reports that she does not use illicit drugs.  Past Medical History  Diagnosis Date  . Depression   . UTI (lower urinary tract infection)   . Cancer     right leg, upper thigh AND CALF melanoma  . Hypertension     Patient denies    Past Surgical History  Procedure Laterality Date  . Melanoma excision  2006    right thigh and calf  . Colonoscopy    .  Tonsillectomy    . Wisdom tooth extraction    . Refractive surgery      Current Outpatient Prescriptions  Medication Sig Dispense Refill  . Calcium 1200-1000 MG-UNIT CHEW Chew by mouth.      . Multiple Vitamin (MULTIVITAMIN) capsule Take 1 capsule by mouth daily.      Marland Kitchen ALPRAZolam (XANAX) 1 MG tablet 1/2 -1 tablet only prn for acute anxiety  30 tablet  0  . diazepam (VALIUM) 10 MG tablet Take 1 tablet (10 mg total) by mouth every 6 (six) hours as needed for anxiety. 1/2 -1 tablet for vertigo every 6 hours prn  30 tablet  0  . zolpidem (AMBIEN) 10 MG tablet Take 1 tablet (10 mg total) by mouth at bedtime as needed for sleep.  30 tablet  2   No current facility-administered medications for this visit.  Family History  Problem Relation Age of Onset  . Cancer Mother     breast  . Stroke Mother   . Alcohol abuse Father   . Colon cancer Neg Hx     ROS:  Pertinent items are noted in HPI.  Otherwise, a comprehensive ROS was negative.  Exam:   BP 150/60  Pulse 80  Resp 16  Ht 5\' 6"  (1.676 m)  Wt 155 lb (70.308 kg)  BMI 25.03 kg/m2  LMP 08/29/2002 Height: 5\' 6"  (167.6 cm)  Ht Readings from Last 3 Encounters:  02/11/14 5\' 6"  (1.676 m)  02/05/13 5\' 6"  (1.676 m)  11/11/12 5' 6.6" (1.692 m)    General appearance: alert, cooperative and appears stated age Head: Normocephalic, without obvious abnormality, atraumatic Neck: no adenopathy, supple, symmetrical, trachea midline and thyroid normal to inspection and palpation Lungs: clear to auscultation bilaterally Breasts: normal appearance, no masses or tenderness Heart: regular rate and rhythm Abdomen: soft, non-tender; no masses,  no organomegaly Extremities: extremities normal, atraumatic, no cyanosis or edema Skin: Skin color, texture, turgor normal. No rashes or lesions Lymph nodes: Cervical, supraclavicular, and axillary nodes normal. No abnormal inguinal nodes palpated Neurologic: Grossly normal   Pelvic: External  genitalia:  no lesions              Urethra:  normal appearing urethra with no masses, tenderness or lesions              Bartholin's and Skene's: normal                 Vagina: normal appearing vagina with normal color and discharge, no lesions              Cervix: anteverted              Pap taken: yes Bimanual Exam:  Uterus:  normal size, contour, position, consistency, mobility, non-tender              Adnexa: no mass, fullness, tenderness               Rectovaginal: Confirms               Anus:  normal sphincter tone, no lesions  A:  Well Woman with normal exam  Postmenopausal no HRT  History of anxiety and depression since husbands death but is dealing effectively with support groups which she leads  Osteopenia stable  History of white coat HTN  History of vertigo  Update immunization  Will do other fasting labs at next year visit  P:   Pap smear as per guidelines Not done  Mammogram due 4/16  Follow with pap 02  TDaP given today  Refill Ambien 10 mg 1/2 -1 tablet # 30/ 2 refills  Refill Xanax 1 mg 1/4- 1/2 tablet prn # 30/ 0 refills  RX for Valium for vertigo treatment 1/2 - 1 tablet eyery 6 hours prn # 30/ 0 refills  Counseled on breast self exam, mammography screening, osteoporosis, adequate intake of calcium and vitamin D, diet and exercise, Kegel's exercises return annually or prn  An After Visit Summary was printed and given to the patient.

## 2014-02-15 LAB — IPS PAP TEST WITH HPV

## 2014-02-15 NOTE — Progress Notes (Signed)
Encounter reviewed by Dr. Dailon Sheeran Silva.  

## 2014-08-30 ENCOUNTER — Encounter: Payer: Self-pay | Admitting: Nurse Practitioner

## 2014-12-24 ENCOUNTER — Encounter: Payer: Self-pay | Admitting: Family Medicine

## 2014-12-24 ENCOUNTER — Ambulatory Visit (INDEPENDENT_AMBULATORY_CARE_PROVIDER_SITE_OTHER): Payer: Medicare Other | Admitting: Family Medicine

## 2014-12-24 VITALS — BP 132/80 | HR 87 | Temp 97.9°F | Wt 150.0 lb

## 2014-12-24 DIAGNOSIS — IMO0001 Reserved for inherently not codable concepts without codable children: Secondary | ICD-10-CM

## 2014-12-24 DIAGNOSIS — R030 Elevated blood-pressure reading, without diagnosis of hypertension: Secondary | ICD-10-CM

## 2014-12-24 DIAGNOSIS — Z23 Encounter for immunization: Secondary | ICD-10-CM

## 2014-12-24 DIAGNOSIS — R42 Dizziness and giddiness: Secondary | ICD-10-CM

## 2014-12-24 NOTE — Progress Notes (Signed)
   Subjective:    Patient ID: Lori Harvey, female    DOB: May 03, 1951, 64 y.o.   MRN: [id number]  HPI Patient seen today for the following issues  Concern for possible elevated blood pressure. She has concerns for possible white coat syndrome. When she at various medical offices her blood pressure tends to elevated but consistently well controlled at home. She's not had any headaches. Recently resumed aerobic exercise.  A long standing history of vertigo. She has been prescribed diazepam and meclizine per her GYN which helped minimally. She has occasional tinnitus in both ears. No acute or chronic hearing loss. No aspirin use. Recent mild dysequilibrium. No syncope or presyncope. She's never had any ataxia.  No headaches. No focal weakness.  Generally very healthy. She is seen by GYN for regular follow-up. She has not had pneumonia vaccine and turned 63 this year.  Past Medical History  Diagnosis Date  . Depression   . UTI (lower urinary tract infection)   . Cancer     right leg, upper thigh AND CALF melanoma  . Hypertension     Patient denies   Past Surgical History  Procedure Laterality Date  . Melanoma excision  2006    right thigh and calf  . Colonoscopy    . Tonsillectomy    . Wisdom tooth extraction    . Refractive surgery      reports that she has never smoked. She has never used smokeless tobacco. She reports that she drinks about 0.6 oz of alcohol per week. She reports that she does not use illicit drugs. family history includes Alcohol abuse in her father; Cancer in her mother; Stroke in her mother. There is no history of Colon cancer. Allergies  Allergen Reactions  . Keflex [Cephalexin]     Nausea, hives      Review of Systems  Constitutional: Negative for fatigue.  Eyes: Negative for visual disturbance.  Respiratory: Negative for cough, chest tightness, shortness of breath and wheezing.   Cardiovascular: Negative for chest pain, palpitations and leg swelling.   Gastrointestinal: Negative for nausea, vomiting and abdominal pain.  Genitourinary: Negative for dysuria.  Neurological: Positive for dizziness. Negative for seizures, syncope, weakness, light-headedness and headaches.  Hematological: Negative for adenopathy.       Objective:   Physical Exam  Constitutional: She is oriented to person, place, and time. She appears well-developed and well-nourished.  HENT:  Right Ear: External ear normal.  Left Ear: External ear normal.  Eyes: Pupils are equal, round, and reactive to light.  Cardiovascular: Normal rate and regular rhythm.  Exam reveals no gallop.   Pulmonary/Chest: Effort normal and breath sounds normal. No respiratory distress. She has no wheezes. She has no rales.  Neurological: She is alert and oriented to person, place, and time. No cranial nerve deficit. Coordination normal.  No focal strength deficits Gait normal. Normal finger to nose testing  Psychiatric: She has a normal mood and affect. Her behavior is normal.          Assessment & Plan:  #1 possible whitecoat syndrome. Blood pressure here stable today and similar reading with her cuff. Continue close monitoring. Increase aerobic exercise #2 long-standing history of vertigo. No worrisome red flags. We offered neurologic consultation for second opinion and she wishes to observe for now #3 health maintenance. Prevnar 13 given. Follow-up in 1 year and Pneumovax then.

## 2014-12-24 NOTE — Progress Notes (Signed)
Pre visit review using our clinic review tool, if applicable. No additional management support is needed unless otherwise documented below in the visit note. 

## 2014-12-24 NOTE — Patient Instructions (Signed)

## 2015-02-17 ENCOUNTER — Ambulatory Visit: Payer: BC Managed Care – PPO | Admitting: Nurse Practitioner

## 2015-02-17 ENCOUNTER — Telehealth: Payer: Self-pay | Admitting: Nurse Practitioner

## 2015-02-17 NOTE — Telephone Encounter (Signed)
Patient has [employer] and needs authorization for her annual. Message to Janett Billow to get this done. I will call her to reschedule once we receive authorization from [facility].

## 2015-02-18 NOTE — Telephone Encounter (Signed)
Received phone call with auth # for blue medicare. Auth [id number] good 02/18/15 thru 05/18/15. Left voicemail for pt to call and schedule between those dates.

## 2015-03-02 ENCOUNTER — Encounter: Payer: Self-pay | Admitting: Adult Health

## 2015-03-02 ENCOUNTER — Ambulatory Visit (INDEPENDENT_AMBULATORY_CARE_PROVIDER_SITE_OTHER): Payer: Medicare Other | Admitting: Adult Health

## 2015-03-02 VITALS — BP 108/80 | Temp 98.6°F | Ht 66.0 in | Wt 154.1 lb

## 2015-03-02 DIAGNOSIS — J0110 Acute frontal sinusitis, unspecified: Secondary | ICD-10-CM

## 2015-03-02 DIAGNOSIS — R319 Hematuria, unspecified: Secondary | ICD-10-CM

## 2015-03-02 LAB — POCT URINALYSIS DIPSTICK
Bilirubin, UA: NEGATIVE
Glucose, UA: NEGATIVE
KETONES UA: NEGATIVE
Leukocytes, UA: NEGATIVE
Nitrite, UA: NEGATIVE
Protein, UA: NEGATIVE
RBC UA: NEGATIVE
Spec Grav, UA: 1.015
Urobilinogen, UA: 0.2
pH, UA: 6

## 2015-03-02 MED ORDER — SULFAMETHOXAZOLE-TMP DS 800-160 MG PO TABS: 1.0000 | ORAL_TABLET | Freq: Two times a day (BID) | ORAL | Status: DC

## 2015-03-02 MED ORDER — BENZONATATE 200 MG PO CAPS: 200.0000 mg | ORAL_CAPSULE | Freq: Three times a day (TID) | ORAL | Status: DC | PRN

## 2015-03-02 NOTE — Progress Notes (Signed)
Pre visit review using our clinic review tool, if applicable. No additional management support is needed unless otherwise documented below in the visit note. 

## 2015-03-02 NOTE — Progress Notes (Signed)
  Subjective:     Lori Harvey is a 64 y.o. female who presents for evaluation of sinus pain. Symptoms include: congestion, cough, facial pain, fevers, frequent clearing of the throat, headaches, nasal congestion and sinus pressure. Onset of symptoms was 10 days ago. Symptoms have been gradually worsening since that time. Past history is significant for no history of pneumonia or bronchitis. Patient is a non-smoker.  The following portions of the patient's history were reviewed and updated as appropriate: allergies, current medications, past family history, past medical history, past social history, past surgical history and problem list.  Review of Systems Pertinent items are noted in HPI.   Objective:    BP 108/80 mmHg  Temp(Src) 98.6 F (37 C) (Oral)  Ht 5\' 6"  (1.676 m)  Wt 154 lb 1.6 oz (69.899 kg)  BMI 24.88 kg/m2  LMP 08/29/2002  General Appearance:    Alert, cooperative, no distress, appears stated age  Head:    Normocephalic, without obvious abnormality, atraumatic  Eyes:    PERRL, conjunctiva/corneas clear, EOM's intact, fundi    benign, both eyes  Ears:    Normal TM's and external ear canals, both ears  Nose:   Nares normal, septum midline, mucosa normal, no drainage    or sinus tenderness  Throat:   Lips, mucosa, and tongue normal; teeth and gums normal  Neck:   Supple, symmetrical, trachea midline, no adenopathy;    thyroid:  no enlargement/tenderness/nodules; no carotid   bruit or JVD  Back:     Symmetric, no curvature, ROM normal, no CVA tenderness  Lungs:     Clear to auscultation bilaterally, respirations unlabored  Chest Wall:    No tenderness or deformity   Heart:    Regular rate and rhythm, S1 and S2 normal, no murmur, rub   or gallop     Abdomen:     Soft, non-tender, bowel sounds active all four quadrants,    no masses, no organomegaly        Extremities:   Extremities normal, atraumatic, no cyanosis or edema  Pulses:   2+ and symmetric all extremities   Skin:   Skin color, texture, turgor normal, no rashes or lesions  Lymph nodes:   Cervical, supraclavicular, and axillary nodes normal  Neurologic:   CNII-XII intact, normal strength, sensation and reflexes    throughout      Assessment:    Acute bacterial sinusitis.    Plan:    Bactrim per medication orders.

## 2015-03-02 NOTE — Patient Instructions (Addendum)
Your urinalysis was negative. I have sent a prescription for Bactrim and Tessalon Pearls to the pharmacy. Please take these as prescribed. You can also take Mucinex for the cough. Keep well hydrated and get plenty of rest. Follow up if your symptoms are not improved in 2-3 days. Follow up sooner if symptoms worsen.   Sinusitis Sinusitis is redness, soreness, and inflammation of the paranasal sinuses. Paranasal sinuses are air pockets within the bones of your face (beneath the eyes, the middle of the forehead, or above the eyes). In healthy paranasal sinuses, mucus is able to drain out, and air is able to circulate through them by way of your nose. However, when your paranasal sinuses are inflamed, mucus and air can become trapped. This can allow bacteria and other germs to grow and cause infection. Sinusitis can develop quickly and last only a short time (acute) or continue over a long period (chronic). Sinusitis that lasts for more than 12 weeks is considered chronic.  CAUSES  Causes of sinusitis include:  Allergies.  Structural abnormalities, such as displacement of the cartilage that separates your nostrils (deviated septum), which can decrease the air flow through your nose and sinuses and affect sinus drainage.  Functional abnormalities, such as when the small hairs (cilia) that line your sinuses and help remove mucus do not work properly or are not present. SIGNS AND SYMPTOMS  Symptoms of acute and chronic sinusitis are the same. The primary symptoms are pain and pressure around the affected sinuses. Other symptoms include:  Upper toothache.  Earache.  Headache.  Bad breath.  Decreased sense of smell and taste.  A cough, which worsens when you are lying flat.  Fatigue.  Fever.  Thick drainage from your nose, which often is green and may contain pus (purulent).  Swelling and warmth over the affected sinuses. DIAGNOSIS  Your health care provider will perform a physical exam.  During the exam, your health care provider may:  Look in your nose for signs of abnormal growths in your nostrils (nasal polyps).  Tap over the affected sinus to check for signs of infection.  View the inside of your sinuses (endoscopy) using an imaging device that has a light attached (endoscope). If your health care provider suspects that you have chronic sinusitis, one or more of the following tests may be recommended:  Allergy tests.  Nasal culture. A sample of mucus is taken from your nose, sent to a lab, and screened for bacteria.  Nasal cytology. A sample of mucus is taken from your nose and examined by your health care provider to determine if your sinusitis is related to an allergy. TREATMENT  Most cases of acute sinusitis are related to a viral infection and will resolve on their own within 10 days. Sometimes medicines are prescribed to help relieve symptoms (pain medicine, decongestants, nasal steroid sprays, or saline sprays).  However, for sinusitis related to a bacterial infection, your health care provider will prescribe antibiotic medicines. These are medicines that will help kill the bacteria causing the infection.  Rarely, sinusitis is caused by a fungal infection. In theses cases, your health care provider will prescribe antifungal medicine. For some cases of chronic sinusitis, surgery is needed. Generally, these are cases in which sinusitis recurs more than 3 times per year, despite other treatments. HOME CARE INSTRUCTIONS   Drink plenty of water. Water helps thin the mucus so your sinuses can drain more easily.  Use a humidifier.  Inhale steam 3 to 4 times a day (  for example, sit in the bathroom with the shower running).  Apply a warm, moist washcloth to your face 3 to 4 times a day, or as directed by your health care provider.  Use saline nasal sprays to help moisten and clean your sinuses.  Take medicines only as directed by your health care provider.  If you  were prescribed either an antibiotic or antifungal medicine, finish it all even if you start to feel better. SEEK IMMEDIATE MEDICAL CARE IF:  You have increasing pain or severe headaches.  You have nausea, vomiting, or drowsiness.  You have swelling around your face.  You have vision problems.  You have a stiff neck.  You have difficulty breathing. MAKE SURE YOU:   Understand these instructions.  Will watch your condition.  Will get help right away if you are not doing well or get worse. Document Released: 10/15/2005 Document Revised: 03/01/2014 Document Reviewed: 10/30/2011 [facility] Patient Information 2015 [place], [place]. This information is not intended to replace advice given to you by your health care provider. Make sure you discuss any questions you have with your health care provider.

## 2015-03-29 ENCOUNTER — Encounter: Payer: Self-pay | Admitting: Nurse Practitioner

## 2015-03-29 ENCOUNTER — Ambulatory Visit (INDEPENDENT_AMBULATORY_CARE_PROVIDER_SITE_OTHER): Payer: Medicare Other | Admitting: Nurse Practitioner

## 2015-03-29 VITALS — BP 144/78 | HR 88 | Ht 66.0 in | Wt 154.0 lb

## 2015-03-29 DIAGNOSIS — Z01419 Encounter for gynecological examination (general) (routine) without abnormal findings: Secondary | ICD-10-CM | POA: Diagnosis not present

## 2015-03-29 DIAGNOSIS — Z0000 Encounter for general adult medical examination without abnormal findings: Secondary | ICD-10-CM

## 2015-03-29 MED ORDER — ESCITALOPRAM OXALATE 10 MG PO TABS: 10.0000 mg | ORAL_TABLET | Freq: Every day | ORAL | Status: DC

## 2015-03-29 NOTE — Patient Instructions (Signed)

## 2015-03-29 NOTE — Progress Notes (Signed)
64 y.o. G17P0010 Widowed  Caucasian Fe here for annual exam.  She is having no vaso symptoms.  No dating or SA.  Gershon Mussel, her husband died 5 years ago.  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  She is very strong and is there for her friends.  She still maintains contact and visits with her step children.  She states this is the hardest thing to ask for help - but she now feels she needs something very mild to help her with depression.  She feels 'weak'  even asking for anything - but just feels sad all the time.  This past year she has sold their home and downsized, sold and bought a newer car, sold the condo at [employer].  All this should help her in the long term but she just feels sad to let go of that part of their life.  Patient's last menstrual period was 08/29/2002.          Sexually active: No.  The current method of family planning is abstinence and post menopausal status.    Exercising: Yes.    walking 3-4 days per week Smoker:  no  Health Maintenance: Pap:  02/11/14, negative, neg HR HPV MMG: 01/20/14 Bilateral diagnostic with biopsy on 01/29/14 showing fibrosis, return to routine screening Colonoscopy: 11/11/12, normal, repeat in 10 years BMD:  10/15/12, T Score -1.5 S/-2.3 L TDaP:  02/11/14 Prevnar 13: 12/24/14 Zoster: 05/10/2010 Labs:  Pt would prefer fasting labs   Urine:  declined   reports that she has never smoked. She has never used smokeless tobacco. She reports that she drinks about 0.6 oz of alcohol per week. She reports that she does not use illicit drugs.  Past Medical History  Diagnosis Date  . Depression   . UTI (lower urinary tract infection)   . Cancer     right leg, upper thigh AND CALF melanoma  . Hypertension     Patient denies    Past Surgical History  Procedure Laterality Date  . Melanoma excision  2006    right thigh  and calf  . Colonoscopy    . Tonsillectomy    . Wisdom tooth extraction    . Refractive surgery      Current Outpatient Prescriptions  Medication Sig Dispense Refill  . Calcium 1200-1000 MG-UNIT CHEW Chew by mouth.    . diazepam (VALIUM) 10 MG tablet Take 1 tablet (10 mg total) by mouth every 6 (six) hours as needed for anxiety. 1/2 -1 tablet for vertigo every 6 hours prn 30 tablet 0  . Multiple Vitamin (MULTIVITAMIN) capsule Take 1 capsule by mouth daily. Juice Plus    . zolpidem (AMBIEN) 10 MG tablet Take 1 tablet (10 mg total) by mouth at bedtime as needed for sleep. 30 tablet 2  . escitalopram (LEXAPRO) 10 MG tablet Take 1 tablet (10 mg total) by mouth daily. 90 tablet 0   No current facility-administered medications for this visit.    Family History  Problem Relation Age of Onset  . Cancer Mother     breast  . Stroke Mother   . Alcohol abuse Father   . Colon cancer Neg Hx     ROS:  Pertinent items are noted in HPI.  Otherwise, a comprehensive ROS was negative.  Exam:   BP 144/78 mmHg  Pulse 88  Ht 5'  6" (1.676 m)  Wt 154 lb (69.854 kg)  BMI 24.87 kg/m2  LMP 08/29/2002 Height: 5\' 6"  (167.6 cm) Ht Readings from Last 3 Encounters:  03/29/15 5\' 6"  (1.676 m)  03/02/15 5\' 6"  (1.676 m)  02/11/14 5\' 6"  (1.676 m)    General appearance: alert, cooperative and appears stated age Head: Normocephalic, without obvious abnormality, atraumatic Neck: no adenopathy, supple, symmetrical, trachea midline and thyroid normal to inspection and palpation Lungs: clear to auscultation bilaterally Breasts: normal appearance, no masses or tenderness Heart: regular rate and rhythm Abdomen: soft, non-tender; no masses,  no organomegaly Extremities: extremities normal, atraumatic, no cyanosis or edema Skin: Skin color, texture, turgor normal. No rashes or lesions Lymph nodes: Cervical, supraclavicular, and axillary nodes normal. No abnormal inguinal nodes palpated Neurologic: Grossly  normal   Pelvic: External genitalia:  no lesions              Urethra:  normal appearing urethra with no masses, tenderness or lesions              Bartholin's and Skene's: normal                 Vagina: normal appearing vagina with normal color and discharge, no lesions              Cervix: anteverted              Pap taken: No. Bimanual Exam:  Uterus:  normal size, contour, position, consistency, mobility, non-tender              Adnexa: no mass, fullness, tenderness               Rectovaginal: Confirms               Anus:  normal sphincter tone, no lesions  Chaperone present:  no  A:  Well Woman with normal exam  Postmenopausal no HRT History of anxiety and depression since husbands death -now feels the need for treatment Osteopenia stable History of white coat HTN    P:   Reviewed health and wellness pertinent to exam  Pap smear as above  Mammogram is due now and will schedule  Will return for future labs  Discussion about depression treatment and normal outcomes of medication therapy and potential side effects.  She is started on Lexapro 10 mg daily - alert for any  CNS side effects.  She is very rational about expected outcomes and aware to CB if any increase in symptoms or suicidal thoughts. She will be rechecked in 3 months or earlier if needed.  Counseled on breast self exam, mammography screening, adequate intake of calcium and vitamin D, diet and exercise  Support is given - she is a wonderful spirit and a blessing to all those she supports. return annually or prn  An After Visit Summary was printed and given to the patient.

## 2015-03-31 ENCOUNTER — Other Ambulatory Visit (INDEPENDENT_AMBULATORY_CARE_PROVIDER_SITE_OTHER): Payer: Medicare Other

## 2015-03-31 ENCOUNTER — Other Ambulatory Visit: Payer: Self-pay | Admitting: Nurse Practitioner

## 2015-03-31 DIAGNOSIS — Z0000 Encounter for general adult medical examination without abnormal findings: Secondary | ICD-10-CM

## 2015-03-31 LAB — LIPID PANEL
Cholesterol: 235 mg/dL — ABNORMAL HIGH (ref 0–200)
HDL: 85 mg/dL (ref >=46)
LDL CALC: 133 mg/dL — AB (ref 0–99)
TRIGLYCERIDES: 86 mg/dL (ref <150)
Total CHOL/HDL Ratio: 2.8 Ratio
VLDL: 17 mg/dL (ref 0–40)

## 2015-03-31 LAB — COMPREHENSIVE METABOLIC PANEL
ALT: 9 U/L (ref 0–35)
AST: 15 U/L (ref 0–37)
Albumin: 4.1 g/dL (ref 3.5–5.2)
Alkaline Phosphatase: 66 U/L (ref 39–117)
BUN: 16 mg/dL (ref 6–23)
CO2: 28 meq/L (ref 19–32)
Calcium: 9 mg/dL (ref 8.4–10.5)
Chloride: 103 mEq/L (ref 96–112)
Creat: 0.88 mg/dL (ref 0.50–1.10)
Glucose, Bld: 103 mg/dL — ABNORMAL HIGH (ref 70–99)
Potassium: 4.5 mEq/L (ref 3.5–5.3)
Sodium: 138 mEq/L (ref 135–145)
Total Bilirubin: 0.5 mg/dL (ref 0.2–1.2)
Total Protein: 6.4 g/dL (ref 6.0–8.3)

## 2015-03-31 LAB — CBC WITH DIFFERENTIAL/PLATELET
BASOS PCT: 0 % (ref 0–1)
Basophils Absolute: 0 10*3/uL (ref 0.0–0.1)
EOS ABS: 0.1 10*3/uL (ref 0.0–0.7)
EOS PCT: 2 % (ref 0–5)
HCT: 41.9 % (ref 36.0–46.0)
HEMOGLOBIN: 13.8 g/dL (ref 12.0–15.0)
LYMPHS PCT: 45 % (ref 12–46)
Lymphs Abs: 2.9 10*3/uL (ref 0.7–4.0)
MCH: 27.4 pg (ref 26.0–34.0)
MCHC: 32.9 g/dL (ref 30.0–36.0)
MCV: 83.3 fL (ref 78.0–100.0)
MPV: 9.4 fL (ref 8.6–12.4)
Monocytes Absolute: 0.5 10*3/uL (ref 0.1–1.0)
Monocytes Relative: 7 % (ref 3–12)
Neutro Abs: 3 10*3/uL (ref 1.7–7.7)
Neutrophils Relative %: 46 % (ref 43–77)
Platelets: 297 10*3/uL (ref 150–400)
RBC: 5.03 MIL/uL (ref 3.87–5.11)
RDW: 14.6 % (ref 11.5–15.5)
WBC: 6.5 10*3/uL (ref 4.0–10.5)

## 2015-03-31 LAB — TSH: TSH: 2.96 u[IU]/mL (ref 0.350–4.500)

## 2015-04-01 LAB — VITAMIN D 25 HYDROXY (VIT D DEFICIENCY, FRACTURES): Vit D, 25-Hydroxy: 22 ng/mL — ABNORMAL LOW (ref 30–100)

## 2015-04-02 NOTE — Progress Notes (Signed)
Encounter reviewed by Dr. Brook Silva.  

## 2015-04-04 LAB — HEMOGLOBIN A1C
Hgb A1c MFr Bld: 5.7 % — ABNORMAL HIGH (ref <5.7)
Mean Plasma Glucose: 117 mg/dL — ABNORMAL HIGH (ref <117)

## 2015-04-16 ENCOUNTER — Encounter: Payer: Self-pay | Admitting: Nurse Practitioner

## 2015-05-12 ENCOUNTER — Encounter: Payer: Self-pay | Admitting: Nurse Practitioner

## 2015-05-12 ENCOUNTER — Telehealth: Payer: Self-pay

## 2015-05-12 NOTE — Telephone Encounter (Signed)
Patient was started on Lexapro on 03/29/2015 at aex with Milford Cage, [place]. Routing to Regina Eck CNM for review of mychart message as seen below as Milford Cage, FNP is out of the office today.

## 2015-05-12 NOTE — Telephone Encounter (Signed)
Spoke with patient. Advised of message as seen below from [place]. Patient is agreeable. Appointment scheduled for tomorrow 05/13/2015 at 10:30am with Milford Cage, [place]. Agreeable to date and time.  Cc: Milford Cage, FNP   Routing to provider for final review. Patient agreeable to disposition. Will close encounter.   Patient aware provider will review message and nurse will return call if any additional advice or change of disposition.

## 2015-05-12 NOTE — Telephone Encounter (Signed)
Visit Follow-Up Question  Message [id number]   From  Latice Waitman   To  Kem Boroughs, FNP   Sent  05/12/2015 10:42 AM     Lori Harvey I'm so sorry but I don't think the Lexapro is doing anything. I seem more lethargic & lacking of energy/motivation; I struggle to keep going with exercise etc. Very little emotion; flat & blah. I started with 10 mg on 6/2; moved to 15 mg on 6/22 then to 20 mg on 6/28. My follow-up visit with you is not until 8/21 and I just don't think I can wait that long to try to see improvement. What do you recommend as next steps? Thanks as always.      Responsible Party    Pool - [facility] Pool No one has taken responsibility for this message.     No actions have been taken on this message.

## 2015-05-12 NOTE — Telephone Encounter (Signed)
She needs OV to evaluate with Patty. It usually takes up to 6-8 weeks to establish a therapeutic level with symptom change.

## 2015-05-12 NOTE — Telephone Encounter (Signed)
Spoke with patient on 05/12/2015. Please see telephone encounter. OV scheduled for tomorrow 05/13/2015 with Milford Cage, FNP.

## 2015-05-12 NOTE — Telephone Encounter (Signed)
Telephone encounter created regarding mychart message sent by patient. Please see phone note dated 05/12/2015.

## 2015-05-13 ENCOUNTER — Ambulatory Visit (INDEPENDENT_AMBULATORY_CARE_PROVIDER_SITE_OTHER): Payer: Medicare Other | Admitting: Nurse Practitioner

## 2015-05-13 ENCOUNTER — Encounter: Payer: Self-pay | Admitting: Nurse Practitioner

## 2015-05-13 VITALS — BP 128/82 | HR 84 | Resp 14 | Wt 151.0 lb

## 2015-05-13 DIAGNOSIS — F4323 Adjustment disorder with mixed anxiety and depressed mood: Secondary | ICD-10-CM

## 2015-05-13 MED ORDER — SERTRALINE HCL 25 MG PO TABS: 25.0000 mg | ORAL_TABLET | Freq: Every day | ORAL | Status: DC

## 2015-05-13 NOTE — Progress Notes (Signed)
63 y.o. [facility] Female G1P0010 here for follow up of depression treated with Lexapro  initiated on 03/29/15.   She is very sensitive to med's so we have gradually increased her dosage.  She finds no help at all with her depression as though she is taking nothing.   She finds that she wants to sleep or stay at hoe most days.  Denies suicidal ideation.  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 Completed all medication as directed.  Denies any symptoms of anxiety or suicidal ideation.  Slight help she thinks with sleep.   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  Looking forward to see if she does well on this and will be in contact through [employer].  She is a wonderful person who is always loving and kind and does not ever ask for anything for herself.   Labs : none indicated  Instructions given regarding: to call ASAP if symptoms change in any way.  RV

## 2015-05-13 NOTE — Patient Instructions (Signed)
Apt with Marya Amsler on 05/23/15 at 5: 00pm

## 2015-05-17 NOTE — Progress Notes (Signed)
Encounter reviewed by Dr. Jessamyn Watterson Amundson C. Silva.  

## 2015-05-23 ENCOUNTER — Ambulatory Visit (INDEPENDENT_AMBULATORY_CARE_PROVIDER_SITE_OTHER): Payer: No Typology Code available for payment source | Admitting: Licensed Clinical Social Worker

## 2015-05-23 DIAGNOSIS — F332 Major depressive disorder, recurrent severe without psychotic features: Secondary | ICD-10-CM

## 2015-05-24 ENCOUNTER — Telehealth: Payer: Self-pay | Admitting: Nurse Practitioner

## 2015-05-24 MED ORDER — TRAZODONE HCL 50 MG PO TABS: ORAL_TABLET | ORAL | Status: DC

## 2015-05-24 NOTE — Telephone Encounter (Signed)
Returning a call to [place].

## 2015-05-24 NOTE — Telephone Encounter (Signed)
This pt. was seen last pm by Melinda Crutch.  She states pt. Is showing slight improvement on Zoloft 25 mg daily but not enough yet.  She is still having trouble with sleep.  Julie's recommendation in to increase Zoloft up to 50 mg a day.  Also try Trazodone 50 mg 1/2 - 1 tablet at hs for sleep.  She will still be following her in counseling.  I have called pt with recommendations and left a voice mail that we called.

## 2015-05-24 NOTE — Telephone Encounter (Signed)
Pt. is informed of consultation notes and she will increase Zoloft to 50 mg daily and Trazodone has been sent to the pharmacy to help her sleep.  She is to remain in counseling under the care of Melinda Crutch who will keep [place] informed of any changes.

## 2015-05-30 ENCOUNTER — Other Ambulatory Visit: Payer: Self-pay | Admitting: Nurse Practitioner

## 2015-05-31 MED ORDER — SERTRALINE HCL 50 MG PO TABS: 50.0000 mg | ORAL_TABLET | Freq: Every day | ORAL | Status: DC

## 2015-05-31 NOTE — Telephone Encounter (Signed)
Message sent to patient in regards to rx being refilled.

## 2015-05-31 NOTE — Telephone Encounter (Signed)
Medication refill request: Zoloft 50 mg  Last AEX:  03/29/15 with PG Next AEX: No AEX scheduled Last MMG (if hormonal medication request): n/a Refill authorized: Please advise.

## 2015-06-02 ENCOUNTER — Ambulatory Visit: Payer: No Typology Code available for payment source | Admitting: Licensed Clinical Social Worker

## 2015-06-09 ENCOUNTER — Ambulatory Visit (INDEPENDENT_AMBULATORY_CARE_PROVIDER_SITE_OTHER): Payer: No Typology Code available for payment source | Admitting: Licensed Clinical Social Worker

## 2015-06-09 DIAGNOSIS — F332 Major depressive disorder, recurrent severe without psychotic features: Secondary | ICD-10-CM | POA: Diagnosis not present

## 2015-06-20 ENCOUNTER — Ambulatory Visit (INDEPENDENT_AMBULATORY_CARE_PROVIDER_SITE_OTHER): Payer: No Typology Code available for payment source | Admitting: Licensed Clinical Social Worker

## 2015-06-20 DIAGNOSIS — F332 Major depressive disorder, recurrent severe without psychotic features: Secondary | ICD-10-CM | POA: Diagnosis not present

## 2015-06-21 ENCOUNTER — Other Ambulatory Visit: Payer: Self-pay | Admitting: Nurse Practitioner

## 2015-06-21 NOTE — Telephone Encounter (Signed)
Medication refill request: Lexapro Last AEX: 03/29/15 with PG Next AEX: Not Scheulded Last MMG (if hormonal medication request):  Refill authorized: Please advise.

## 2015-06-22 ENCOUNTER — Telehealth: Payer: Self-pay | Admitting: Nurse Practitioner

## 2015-06-22 NOTE — Telephone Encounter (Signed)
Call from Lori Harvey - pt was seen there on Monday.  She is sleeping some better but not all through the night .  suggestion is to put her Trazodone at 1 tablet at HS instead of 1/2 tablet.  Also she is still feeling blue and not really enjoying life - will increase Zoloft from 50 mg to 75 mg.  Pt is informed of recommendations and she is agreeable.  she remains to be followed there closely so upcoming apt. Here will be extended out longer.  When she needs a refill on Zoloft she is to call.  She will be taking 1 1/2 tablet daily of Zoloft 50 mg.

## 2015-06-29 ENCOUNTER — Ambulatory Visit: Payer: Medicare Other | Admitting: Nurse Practitioner

## 2015-07-06 ENCOUNTER — Telehealth: Payer: Self-pay | Admitting: Nurse Practitioner

## 2015-07-06 ENCOUNTER — Ambulatory Visit (INDEPENDENT_AMBULATORY_CARE_PROVIDER_SITE_OTHER): Payer: No Typology Code available for payment source | Admitting: Licensed Clinical Social Worker

## 2015-07-06 DIAGNOSIS — F332 Major depressive disorder, recurrent severe without psychotic features: Secondary | ICD-10-CM

## 2015-07-06 NOTE — Telephone Encounter (Signed)
Marya Amsler, Counselor, called requesting to speak with Kem Boroughs, FNP about this patient.

## 2015-07-08 ENCOUNTER — Other Ambulatory Visit: Payer: Self-pay | Admitting: Nurse Practitioner

## 2015-07-08 MED ORDER — SERTRALINE HCL 100 MG PO TABS: 100.0000 mg | ORAL_TABLET | Freq: Every day | ORAL | Status: DC

## 2015-07-08 NOTE — Telephone Encounter (Signed)
Message was left on cell phone number.  Per pt she prefers [place] to leave a MSG if needed.  After speaking with Marya Amsler we are going to increase Zoloft to 100 mg daily and continue with the Trazodone at hs.  Per Almyra Free she feels pt seems brighter and less depressed but pt. feels very little change.  Almyra Free felt it may help to increase he med's and see how she does.  She remains in therapy every 1-2 weeks.  Dinna will be going on vacation tomorrow and is hopeful that will help her to feel emotionally better.  She is instructed to call back if any other questions or concerns.

## 2015-07-20 ENCOUNTER — Ambulatory Visit (INDEPENDENT_AMBULATORY_CARE_PROVIDER_SITE_OTHER): Payer: No Typology Code available for payment source | Admitting: Licensed Clinical Social Worker

## 2015-07-20 DIAGNOSIS — F332 Major depressive disorder, recurrent severe without psychotic features: Secondary | ICD-10-CM

## 2015-08-01 ENCOUNTER — Encounter: Payer: Self-pay | Admitting: Nurse Practitioner

## 2015-08-02 ENCOUNTER — Encounter: Payer: Self-pay | Admitting: Nurse Practitioner

## 2015-08-02 ENCOUNTER — Other Ambulatory Visit: Payer: Self-pay | Admitting: Nurse Practitioner

## 2015-08-02 MED ORDER — BUPROPION HCL ER (XL) 150 MG PO TB24: 150.0000 mg | ORAL_TABLET | Freq: Every day | ORAL | Status: DC

## 2015-08-03 ENCOUNTER — Other Ambulatory Visit: Payer: Self-pay | Admitting: Nurse Practitioner

## 2015-08-03 NOTE — Telephone Encounter (Signed)
Left Voicemail to call back

## 2015-08-03 NOTE — Telephone Encounter (Signed)
Call patient for how often she is using medication and how much is left.

## 2015-08-03 NOTE — Telephone Encounter (Addendum)
Medication refill request: trazodone Last AEX:  03/29/15 with PG Next AEX: not schedulded Last MMG (if hormonal medication request):  Refill authorized: please advise

## 2015-08-04 NOTE — Telephone Encounter (Signed)
Return call from patient.  Pt did not request or need refill.  Request denied with pharmacy.  Routing to provider for final review.  Closing encounter.

## 2015-08-10 ENCOUNTER — Ambulatory Visit (INDEPENDENT_AMBULATORY_CARE_PROVIDER_SITE_OTHER): Payer: No Typology Code available for payment source | Admitting: Licensed Clinical Social Worker

## 2015-08-10 DIAGNOSIS — F332 Major depressive disorder, recurrent severe without psychotic features: Secondary | ICD-10-CM

## 2015-08-25 ENCOUNTER — Ambulatory Visit (INDEPENDENT_AMBULATORY_CARE_PROVIDER_SITE_OTHER): Payer: Medicare Other

## 2015-08-25 DIAGNOSIS — Z23 Encounter for immunization: Secondary | ICD-10-CM

## 2015-09-14 ENCOUNTER — Other Ambulatory Visit: Payer: Self-pay | Admitting: Nurse Practitioner

## 2015-09-14 ENCOUNTER — Encounter: Payer: Self-pay | Admitting: Nurse Practitioner

## 2015-09-14 ENCOUNTER — Ambulatory Visit (INDEPENDENT_AMBULATORY_CARE_PROVIDER_SITE_OTHER): Payer: No Typology Code available for payment source | Admitting: Licensed Clinical Social Worker

## 2015-09-14 DIAGNOSIS — F332 Major depressive disorder, recurrent severe without psychotic features: Secondary | ICD-10-CM

## 2015-09-15 NOTE — Telephone Encounter (Signed)
Patty can you review and advise. Patient sent mychart message and states she is seeing Marya Amsler and that this provider recommended that she continue with Trazodone.  Okay to refill?

## 2015-09-16 ENCOUNTER — Other Ambulatory Visit: Payer: Self-pay | Admitting: Nurse Practitioner

## 2015-09-16 MED ORDER — TRAZODONE HCL 50 MG PO TABS: ORAL_TABLET | ORAL | Status: DC

## 2015-09-16 NOTE — Telephone Encounter (Signed)
Rx has already been sent Thank you.

## 2015-10-02 ENCOUNTER — Other Ambulatory Visit: Payer: Self-pay | Admitting: Nurse Practitioner

## 2015-10-03 NOTE — Telephone Encounter (Signed)
Medication refill request: Zoloft  Last AEX:  03/29/15 PG Next AEX: none Last MMG (if hormonal medication request): 02/01/14 [place] Left Breast Bx - fibrosis  Refill authorized: 07/08/15 #90tabs/0R. Today please advise.

## 2015-10-03 NOTE — Telephone Encounter (Signed)
Medication refill request: Sertraline  Last AEX:  03/29/15 PG  Next AEX: No appt. 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 Refill authorized: 07/08/15 #90 tablets. 0 refills. Please advise

## 2015-10-14 ENCOUNTER — Telehealth: Payer: Self-pay | Admitting: Nurse Practitioner

## 2015-10-14 MED ORDER — BUPROPION HCL ER (XL) 150 MG PO TB24: 150.0000 mg | ORAL_TABLET | Freq: Every day | ORAL | Status: DC

## 2015-10-14 NOTE — Telephone Encounter (Signed)
Prescription was approved by Kem Boroughs, FNP.  Automatic reply was sent via mychart after approval.  Encounter was closed by provider.

## 2015-11-16 ENCOUNTER — Ambulatory Visit: Payer: No Typology Code available for payment source | Admitting: Licensed Clinical Social Worker

## 2016-01-05 ENCOUNTER — Ambulatory Visit (INDEPENDENT_AMBULATORY_CARE_PROVIDER_SITE_OTHER): Payer: PPO | Admitting: Family Medicine

## 2016-01-05 VITALS — BP 130/80 | HR 91 | Temp 98.8°F | Ht 66.0 in | Wt 153.0 lb

## 2016-01-05 DIAGNOSIS — S81812A Laceration without foreign body, left lower leg, initial encounter: Secondary | ICD-10-CM

## 2016-01-05 NOTE — Progress Notes (Signed)
   Subjective:    Patient ID: Lori Harvey, female    DOB: 1950-11-15, 65 y.o.   MRN: [id number]  HPI Patient seen with left leg laceration. Last night she went to change a filter and stood up follow-up four-legged stool and one of the legs broke. She fell down bruising her right knee and with a large gash left anterior leg. This occurred around 7 PM. She washed out the wound thoroughly with soap and water and also peroxide. She then applied topical antibiotic and dressing. She's had some bleeding since then.  There is no head injury or other injury reported other than small laceration left elbow. Tetanus 2015. Ambulating without difficulty.  Past Medical History  Diagnosis Date  . Depression   . UTI (lower urinary tract infection)   . Cancer     right leg, upper thigh AND CALF melanoma  . Hypertension     Patient denies   Past Surgical History  Procedure Laterality Date  . Melanoma excision  2006    right thigh and calf  . Colonoscopy    . Tonsillectomy    . Wisdom tooth extraction    . Refractive surgery      reports that she has never smoked. She has never used smokeless tobacco. She reports that she drinks about 0.6 oz of alcohol per week. She reports that she does not use illicit drugs. family history includes Alcohol abuse in her father; Cancer in her mother; Stroke in her mother. There is no history of Colon cancer. Allergies  Allergen Reactions  . Keflex [Cephalexin]     Nausea, hives      Review of Systems  Constitutional: Negative for fever and chills.       Objective:   Physical Exam  Constitutional: She appears well-developed and well-nourished.  Cardiovascular: Normal rate and regular rhythm.   Pulmonary/Chest: Effort normal and breath sounds normal. No respiratory distress. She has no wheezes. She has no rales.  Skin:  Left anterior leg 4 cm laceration which is fairly gaping but no active bleeding. No surrounding erythema. No drainage. No  necrosis.  She has very superficial small less than 1 cm flap laceration left elbow. No bleeding.          Assessment & Plan:  Laceration left anterior leg. Given duration of injury over 15 hours ago (and location=leg) would not recommend suturing at this point-although some studies suggest up to 19 hours in low risk situations. We discussed wound care. Keep clean with soap and water. Continue topical antibody can keep covered until healed. Frequent elevation. Follow-up immediately for signs of secondary infection and these were reviewed. Her tetanus is up-to-date. She is aware this will take longer to heal because of location and also fairly large wound.

## 2016-01-05 NOTE — Patient Instructions (Signed)
Keep wound dry for the first 24 hours then clean daily with soap and water for one week. Apply topical antibiotic daily for 3-4 days. Keep covered with clean dressing for 4-5 days. Follow up promptly for any signs of infection such as redness, warmth, pain, or drainage.  

## 2016-01-05 NOTE — Progress Notes (Signed)
Pre visit review using our clinic review tool, if applicable. No additional management support is needed unless otherwise documented below in the visit note. 

## 2016-01-11 ENCOUNTER — Telehealth: Payer: Self-pay | Admitting: *Deleted

## 2016-01-11 NOTE — Telephone Encounter (Signed)
Patient came into the office today with her sister for a visit with Dr Hilarie Fredrickson. Dr Hilarie Fredrickson and Ms.Duecker spoke and decided that her colonoscopy intervals should be changed from 10 years to 5 years due to family history of advanced adenomatous colon polyps in 2 sisters. Patient's last colonoscopy was with Dr Olevia Perches 10/2012, therefore her recall should be 10/2017. Recall has been changed in EPIC and patient has been advised.

## 2016-01-13 ENCOUNTER — Telehealth: Payer: Self-pay

## 2016-01-13 NOTE — Telephone Encounter (Signed)
Left message on her machine. Reviewed signs and symptoms of infection.  Sounds more like she is having some clear serous drainage.  Agree with advice given.

## 2016-01-13 NOTE — Telephone Encounter (Signed)
Home Care Advice Given  [facility] Night - Client [facility] Patient Name: Lori Harvey Gender: Female DOB: Dec 03, 1950 Age: 66 Y 49 M 15 D Return Phone Number: [phone] (Primary), [phone] (Secondary) Address: City/State/Zip: [place] Client [facility] Night - Client Client Site [facility] - Night Physician Carolann Littler Contact Type Call Who Is Calling Patient / Member / Family / Caregiver Call Type Triage / Clinical Caller Name Dmia Relationship To Patient Self Return Phone Number [phone] (Primary) Chief Complaint Wound Check or Dressing Change Reason for Call Symptomatic / Request for [place] states she had a wound on her left shin that is having a discharge that is clear- was never sutured and she is a little concerned Translation No Nurse Assessment Nurse: Christel Mormon, RN, Levada Dy Date/Time ([place] Time): 01/12/2016 4:13:10 PM Confirm and document reason for call. If symptomatic, describe symptoms. You must click the next button to save text entered. ---Caller states she saw the doctor last Thursday and has been treating it with Telfa and topical antibiotic. No redness, no streaks or fever at the wound. She states it is leaking clear fluid but if she dabs it with gauze it has a light tint. She states it was too late for sutures. She states the top part looks like it is starting to scab a little. No pain. He did not think the butterfly bandaids would help esp now. The wound is less deep now. Has the patient traveled out of the country within the last 30 days? ---Not Applicable Does the patient have any new or worsening symptoms? ---No Please document clinical information provided and list any resource used. ---Suggested that during the day she use Kerlix vs Coban during the day to allow the area get more air and continue  the antibiotic cream. She will call back with any signs/symptoms of infection: increased tenderness, swelling, cloudy drainage or redness. Guidelines Guideline Title Affirmed Question Affirmed Notes Nurse Date/Time (Eastern Time) Disp. Time Eilene Ghazi Time) Disposition Final User 01/12/2016 4:26:21 PM Call Completed Christel Mormon, RN, Levada Dy 01/12/2016 4:26:11 PM Clinical Call Yes Christel Mormon, RN, Levada Dy

## 2016-01-17 ENCOUNTER — Other Ambulatory Visit: Payer: Self-pay

## 2016-01-17 DIAGNOSIS — Z1231 Encounter for screening mammogram for malignant neoplasm of breast: Secondary | ICD-10-CM

## 2016-02-02 ENCOUNTER — Ambulatory Visit
Admission: RE | Admit: 2016-02-02 | Discharge: 2016-02-02 | Disposition: A | Discharge status: Home / Self Care | Payer: PPO | Source: Ambulatory Visit

## 2016-02-02 DIAGNOSIS — Z1231 Encounter for screening mammogram for malignant neoplasm of breast: Secondary | ICD-10-CM

## 2016-02-16 DIAGNOSIS — H2513 Age-related nuclear cataract, bilateral: Secondary | ICD-10-CM | POA: Diagnosis not present

## 2016-07-04 ENCOUNTER — Other Ambulatory Visit: Payer: Self-pay | Admitting: Nurse Practitioner

## 2016-07-04 NOTE — Telephone Encounter (Signed)
Medication refill request: Bupropion Last AEX:  03/29/15 PG Next AEX: 07/16/16 PG Last MMG (if hormonal medication request): 02/02/16 [facility] Refill authorized: 10/14/15 #90 2R. Please advise.

## 2016-07-16 ENCOUNTER — Ambulatory Visit (INDEPENDENT_AMBULATORY_CARE_PROVIDER_SITE_OTHER): Payer: PPO | Admitting: Nurse Practitioner

## 2016-07-16 ENCOUNTER — Encounter: Payer: Self-pay | Admitting: Nurse Practitioner

## 2016-07-16 VITALS — BP 124/86 | HR 76 | Ht 65.25 in | Wt 153.0 lb

## 2016-07-16 DIAGNOSIS — Z01419 Encounter for gynecological examination (general) (routine) without abnormal findings: Secondary | ICD-10-CM

## 2016-07-16 DIAGNOSIS — Z0000 Encounter for general adult medical examination without abnormal findings: Secondary | ICD-10-CM | POA: Diagnosis not present

## 2016-07-16 DIAGNOSIS — E7800 Pure hypercholesterolemia, unspecified: Secondary | ICD-10-CM | POA: Diagnosis not present

## 2016-07-16 DIAGNOSIS — F4323 Adjustment disorder with mixed anxiety and depressed mood: Secondary | ICD-10-CM

## 2016-07-16 DIAGNOSIS — R5383 Other fatigue: Secondary | ICD-10-CM | POA: Diagnosis not present

## 2016-07-16 DIAGNOSIS — E559 Vitamin D deficiency, unspecified: Secondary | ICD-10-CM | POA: Diagnosis not present

## 2016-07-16 LAB — CBC WITH DIFFERENTIAL/PLATELET
BASOS ABS: 0 {cells}/uL (ref 0–200)
Basophils Relative: 0 %
EOS PCT: 1 %
Eosinophils Absolute: 77 cells/uL (ref 15–500)
HEMATOCRIT: 39.1 % (ref 35.0–45.0)
HEMOGLOBIN: 13 g/dL (ref 11.7–15.5)
LYMPHS ABS: 2695 {cells}/uL (ref 850–3900)
Lymphocytes Relative: 35 %
MCH: 28.1 pg (ref 27.0–33.0)
MCHC: 33.2 g/dL (ref 32.0–36.0)
MCV: 84.4 fL (ref 80.0–100.0)
MONO ABS: 539 {cells}/uL (ref 200–950)
MPV: 9.3 fL (ref 7.5–12.5)
Monocytes Relative: 7 %
NEUTROS ABS: 4389 {cells}/uL (ref 1500–7800)
NEUTROS PCT: 57 %
Platelets: 268 10*3/uL (ref 140–400)
RBC: 4.63 MIL/uL (ref 3.80–5.10)
RDW: 13.9 % (ref 11.0–15.0)
WBC: 7.7 10*3/uL (ref 3.8–10.8)

## 2016-07-16 LAB — COMPREHENSIVE METABOLIC PANEL
ALT: 10 U/L (ref 6–29)
AST: 16 U/L (ref 10–35)
Albumin: 4.2 g/dL (ref 3.6–5.1)
Alkaline Phosphatase: 62 U/L (ref 33–130)
BUN: 11 mg/dL (ref 7–25)
CALCIUM: 9.3 mg/dL (ref 8.6–10.4)
CO2: 28 mmol/L (ref 20–31)
Chloride: 103 mmol/L (ref 98–110)
Creat: 0.87 mg/dL (ref 0.50–0.99)
GLUCOSE: 94 mg/dL (ref 65–99)
POTASSIUM: 4.2 mmol/L (ref 3.5–5.3)
Sodium: 140 mmol/L (ref 135–146)
Total Bilirubin: 0.4 mg/dL (ref 0.2–1.2)
Total Protein: 6.6 g/dL (ref 6.1–8.1)

## 2016-07-16 LAB — LIPID PANEL
CHOL/HDL RATIO: 2.9 ratio (ref <=5.0)
Cholesterol: 246 mg/dL — ABNORMAL HIGH (ref 125–200)
HDL: 86 mg/dL (ref >=46)
LDL CALC: 136 mg/dL — AB (ref <130)
Triglycerides: 121 mg/dL (ref <150)
VLDL: 24 mg/dL (ref <30)

## 2016-07-16 LAB — TSH: TSH: 2.09 m[IU]/L

## 2016-07-16 LAB — HEPATITIS C ANTIBODY: HCV AB: NEGATIVE

## 2016-07-16 MED ORDER — DIAZEPAM 10 MG PO TABS: 10.0000 mg | ORAL_TABLET | Freq: Four times a day (QID) | ORAL | 0 refills | Status: DC | PRN

## 2016-07-16 MED ORDER — BUPROPION HCL ER (XL) 150 MG PO TB24: 150.0000 mg | ORAL_TABLET | Freq: Every day | ORAL | 4 refills | Status: DC

## 2016-07-16 NOTE — Patient Instructions (Signed)

## 2016-07-16 NOTE — Progress Notes (Signed)
Patient ID: Lori Harvey, female   DOB: 02-01-1951, 65 y.o.   MRN: [id number]  65 y.o. G1P0010 Widowed  Caucasian Fe here for annual exam.  No new health problems.  She still does not sleep well but only takes Trazodone prn.  He does feel better emotionally but still in a slump at times.  She still has her work with widows and counseling.  She did buy a cabin at [employer].  Patient's last menstrual period was 08/29/2002 (approximate).          Sexually active: No.  The current method of family planning is abstinence and post menopausal status.    Exercising: Yes.  Walking 3-5 days per week Smoker:  no  Health Maintenance: Pap:  02/11/14, negative with neg HR HPV MMG: 02/02/16, 3D, Bi-Rads 1: Negative  Colonoscopy: 11/11/12, normal, repeat in 10 years, will do at 5 year interval due to sisters' X 2 history of colon mass BMD:  10/15/12, T Score -1.5 Spine / -2.3 Left Femur Neck TDaP:  02/11/14 Pneumonia: 12/24/14 Prevnar 13 Shingles: 05/10/2010 Hep C: done today Labs: done today   reports that she has never smoked. She has never used smokeless tobacco. She reports that she drinks about 0.6 oz of alcohol per week . She reports that she does not use drugs.  Past Medical History:  Diagnosis Date  . Cancer (HCC)    right leg, upper thigh AND CALF melanoma  . Depression   . Hypertension    Patient denies  . UTI (lower urinary tract infection)     Past Surgical History:  Procedure Laterality Date  . COLONOSCOPY    . MELANOMA EXCISION  2006   right thigh and calf  . REFRACTIVE SURGERY    . TONSILLECTOMY    . WISDOM TOOTH EXTRACTION      Current Outpatient Prescriptions  Medication Sig Dispense Refill  . buPROPion (WELLBUTRIN XL) 150 MG 24 hr tablet Take 1 tablet (150 mg total) by mouth daily. 90 tablet 4  . Calcium 1200-1000 MG-UNIT CHEW Chew by mouth.    . diazepam (VALIUM) 10 MG tablet Take 1 tablet (10 mg total) by mouth every 6 (six) hours as needed for anxiety. 1/2 -1 tablet  for vertigo every 6 hours prn 30 tablet 0  . Multiple Vitamin (MULTIVITAMIN) capsule Take 1 capsule by mouth daily. Juice Plus    . traZODone (DESYREL) 50 MG tablet 1/2 - 1 tablet at HS for sleep (Patient not taking: Reported on 07/16/2016) 30 tablet 3  . zolpidem (AMBIEN) 10 MG tablet Take 1 tablet (10 mg total) by mouth at bedtime as needed for sleep. (Patient not taking: Reported on 07/16/2016) 30 tablet 2   No current facility-administered medications for this visit.     Family History  Problem Relation Age of Onset  . Cancer Mother     breast  . Stroke Mother   . Alcohol abuse Father   . Heart attack Father   . Other Sister 4    colon mass, benign  . Hypertension Sister   . Prostate cancer Brother   . Other Sister 83    colon mass  . Hypertension Sister   . Colon cancer Neg Hx     ROS:  Pertinent items are noted in HPI.  Otherwise, a comprehensive ROS was negative.  Exam:   BP 124/86 (BP Location: Right Arm, Patient Position: Sitting, Cuff Size: Normal)   Pulse 76   Ht 5' 5.25" (1.657 m)  Wt 153 lb (69.4 kg)   LMP 08/29/2002 (Approximate)   BMI 25.27 kg/m  Height: 5' 5.25" (165.7 cm) Ht Readings from Last 3 Encounters:  07/16/16 5' 5.25" (1.657 m)  01/05/16 5\' 6"  (1.676 m)  03/29/15 5\' 6"  (1.676 m)    General appearance: alert, cooperative and appears stated age Head: Normocephalic, without obvious abnormality, atraumatic Neck: no adenopathy, supple, symmetrical, trachea midline and thyroid normal to inspection and palpation Lungs: clear to auscultation bilaterally Breasts: normal appearance, no masses or tenderness Heart: regular rate and rhythm Abdomen: soft, non-tender; no masses,  no organomegaly Extremities: extremities normal, atraumatic, no cyanosis or edema Skin: Skin color, texture, turgor normal. No rashes or lesions Lymph nodes: Cervical, supraclavicular, and axillary nodes normal. No abnormal inguinal nodes palpated Neurologic: Grossly  normal   Pelvic: External genitalia:  no lesions              Urethra:  normal appearing urethra with no masses, tenderness or lesions              Bartholin's and Skene's: normal                 Vagina: normal appearing vagina with normal color and discharge, no lesions              Cervix: anteverted              Pap taken: No. Bimanual Exam:  Uterus:  normal size, contour, position, consistency, mobility, non-tender              Adnexa: no mass, fullness, tenderness               Rectovaginal: Confirms               Anus:  normal sphincter tone, no lesions  Chaperone present: yes  A:  Well Woman with normal exam   Postmenopausal no HRT History of anxiety and depression since husbands death -now feels the need for treatment Osteopenia stable History of white coat HTN   P:   Reviewed health and wellness pertinent to exam  Pap smear not done  Mammogram is due 4/18  Refill on Valium which is only for use with vertigo  Follow with labs  Refill on Wellbutrin 150 mg daily  Counseled on breast self exam, mammography screening, adequate intake of calcium and vitamin D, diet and exercise, Kegel's exercises return annually or prn  An After Visit Summary was printed and given to the patient.

## 2016-07-17 LAB — VITAMIN D 25 HYDROXY (VIT D DEFICIENCY, FRACTURES): VIT D 25 HYDROXY: 35 ng/mL (ref 30–100)

## 2016-07-20 NOTE — Progress Notes (Signed)
Encounter reviewed by Dr. Brook Amundson C. Silva.  

## 2016-08-15 ENCOUNTER — Other Ambulatory Visit: Payer: Self-pay | Admitting: Nurse Practitioner

## 2016-08-15 NOTE — Telephone Encounter (Signed)
Medication refill request: traZODone 50mg  Last AEX:  07/16/16 PG Next AEX: 07/17/17  Last MMG (if hormonal medication request): 02/02/16 BIRADS1 negative Refill authorized: 09/16/15 #30 w/3 refills; today please advise

## 2016-12-07 ENCOUNTER — Telehealth: Payer: Self-pay | Admitting: Family Medicine

## 2016-12-07 NOTE — Telephone Encounter (Signed)
Pt would like to know if she needs a 2nd pneumonia vaccine and she would also like to have the new shingle vaccine (shinglrix).  Pt will call her insurance company to see if it is covered and will wajit to hear from the [place] to schedule both injections at the same time.

## 2016-12-10 NOTE — Telephone Encounter (Signed)
Looks like pt is due for her wellness exam in march. She can get the Pneumo 23 with the new shingles vaccine then. We currently do not have the new shingles shot but should by March. If she chooses to only set a appt for the vaccines then she needs to call back at a later date to check to see if we have them yet. Thanks.

## 2016-12-12 DIAGNOSIS — L821 Other seborrheic keratosis: Secondary | ICD-10-CM | POA: Diagnosis not present

## 2016-12-12 DIAGNOSIS — D2271 Melanocytic nevi of right lower limb, including hip: Secondary | ICD-10-CM | POA: Diagnosis not present

## 2016-12-12 DIAGNOSIS — Z86018 Personal history of other benign neoplasm: Secondary | ICD-10-CM | POA: Diagnosis not present

## 2016-12-12 DIAGNOSIS — Z85820 Personal history of malignant melanoma of skin: Secondary | ICD-10-CM | POA: Diagnosis not present

## 2016-12-12 DIAGNOSIS — Z23 Encounter for immunization: Secondary | ICD-10-CM | POA: Diagnosis not present

## 2016-12-12 DIAGNOSIS — D1800 Hemangioma unspecified site: Secondary | ICD-10-CM | POA: Diagnosis not present

## 2016-12-12 DIAGNOSIS — L814 Other melanin hyperpigmentation: Secondary | ICD-10-CM | POA: Diagnosis not present

## 2016-12-12 DIAGNOSIS — D225 Melanocytic nevi of trunk: Secondary | ICD-10-CM | POA: Diagnosis not present

## 2016-12-12 DIAGNOSIS — L570 Actinic keratosis: Secondary | ICD-10-CM | POA: Diagnosis not present

## 2016-12-12 DIAGNOSIS — D2272 Melanocytic nevi of left lower limb, including hip: Secondary | ICD-10-CM | POA: Diagnosis not present

## 2016-12-13 NOTE — Telephone Encounter (Signed)
Pt scheduled the PNA vaccine, and will wait until March for the shingrix vaccine, Medicare will not pay for until then.

## 2016-12-17 ENCOUNTER — Ambulatory Visit (INDEPENDENT_AMBULATORY_CARE_PROVIDER_SITE_OTHER): Payer: PPO

## 2016-12-17 DIAGNOSIS — Z23 Encounter for immunization: Secondary | ICD-10-CM

## 2016-12-17 NOTE — Progress Notes (Signed)
Patient received a Pneumovax 23 for pneumonia immunization on 12/17/2016 at 12.10pm. Given by Rosealee Albee CMA.

## 2017-01-30 ENCOUNTER — Other Ambulatory Visit: Payer: Self-pay | Admitting: *Deleted

## 2017-01-30 MED ORDER — ZOSTER VAC RECOMB ADJUVANTED 50 MCG/0.5ML IM SUSR: 0.5000 mL | Freq: Once | INTRAMUSCULAR | 1 refills | Status: AC

## 2017-01-31 ENCOUNTER — Other Ambulatory Visit: Payer: Self-pay | Admitting: *Deleted

## 2017-01-31 MED ORDER — ZOSTER VAC RECOMB ADJUVANTED 50 MCG/0.5ML IM SUSR: 0.5000 mL | Freq: Once | INTRAMUSCULAR | 1 refills | Status: AC

## 2017-01-31 NOTE — Telephone Encounter (Signed)
Rx done. 

## 2017-02-15 DIAGNOSIS — H2513 Age-related nuclear cataract, bilateral: Secondary | ICD-10-CM | POA: Diagnosis not present

## 2017-02-15 DIAGNOSIS — H5213 Myopia, bilateral: Secondary | ICD-10-CM | POA: Diagnosis not present

## 2017-03-06 ENCOUNTER — Ambulatory Visit (INDEPENDENT_AMBULATORY_CARE_PROVIDER_SITE_OTHER): Payer: PPO | Admitting: Family Medicine

## 2017-03-06 ENCOUNTER — Encounter: Payer: Self-pay | Admitting: Family Medicine

## 2017-03-06 VITALS — BP 142/80 | HR 70 | Temp 99.3°F | Wt 147.6 lb

## 2017-03-06 DIAGNOSIS — J0110 Acute frontal sinusitis, unspecified: Secondary | ICD-10-CM | POA: Diagnosis not present

## 2017-03-06 MED ORDER — LEVOFLOXACIN 500 MG PO TABS: 500.0000 mg | ORAL_TABLET | Freq: Every day | ORAL | 0 refills | Status: DC

## 2017-03-06 NOTE — Patient Instructions (Signed)

## 2017-03-06 NOTE — Progress Notes (Signed)
Subjective:     Patient ID: Lori Harvey, female   DOB: 08/27/1951, 66 y.o.   MRN: [id number]  HPI Patient seen for right frontal sinus pressure for the past week. Minimal drainage. She has had some allergy symptoms intermittently. She tried Zyrtec without improvement. She's had some fatigue. Intermittent headache. Occasional cough. Occasional hoarseness. Denies any fever or chills. She is concerned she will be flying soon  Past Medical History:  Diagnosis Date  . Cancer (HCC)    right leg, upper thigh AND CALF melanoma  . Depression   . Hypertension    Patient denies  . UTI (lower urinary tract infection)    Past Surgical History:  Procedure Laterality Date  . COLONOSCOPY    . MELANOMA EXCISION  2006   right thigh and calf  . REFRACTIVE SURGERY    . TONSILLECTOMY    . WISDOM TOOTH EXTRACTION      reports that she has never smoked. She has never used smokeless tobacco. She reports that she drinks about 0.6 oz of alcohol per week . She reports that she does not use drugs. family history includes Alcohol abuse in her father; Cancer in her mother; Heart attack in her father; Hypertension in her sister and sister; Other (age of onset: 49) in her sister; Other (age of onset: 44) in her sister; Prostate cancer in her brother; Stroke in her mother. Allergies  Allergen Reactions  . Keflex [Cephalexin]     Nausea, hives     Review of Systems  Constitutional: Negative for chills and fever.  HENT: Positive for congestion, sinus pain and sinus pressure.   Respiratory: Positive for cough.   Neurological: Positive for headaches.       Objective:   Physical Exam  Constitutional: She appears well-developed and well-nourished.  HENT:  Right Ear: External ear normal.  Left Ear: External ear normal.  Nose: Nose normal.  Mouth/Throat: Oropharynx is clear and moist.  Neck: Neck supple.  Cardiovascular: Normal rate and regular rhythm.   Pulmonary/Chest: Effort normal and breath sounds  normal. No respiratory distress. She has no wheezes. She has no rales.  Lymphadenopathy:    She has no cervical adenopathy.       Assessment:     Probable acute right frontal sinusitis    Plan:     -She has reported allergy to cephalosporin so probably avoid penicillin as well. She is getting ready to travel to [facility] and would avoid doxycycline because of sun sensitivity. Levaquin 500 milligrams once daily for 10 days -Consider Afrin nasal 30 minutes to 1 hour prior to flight -Consider nasal irrigation with saline solution with distilled water  Eulas Post MD [facility] at [facility]

## 2017-04-22 DIAGNOSIS — H5711 Ocular pain, right eye: Secondary | ICD-10-CM | POA: Diagnosis not present

## 2017-05-14 ENCOUNTER — Telehealth: Payer: Self-pay | Admitting: Certified Nurse Midwife

## 2017-05-14 NOTE — Telephone Encounter (Signed)
Left message regarding upcoming appointment has been canceled and needs to be rescheduled. °

## 2017-07-17 ENCOUNTER — Ambulatory Visit: Payer: PPO | Admitting: Nurse Practitioner

## 2017-07-18 ENCOUNTER — Encounter: Payer: Self-pay | Admitting: Family Medicine

## 2017-07-18 ENCOUNTER — Other Ambulatory Visit (HOSPITAL_COMMUNITY)
Admission: RE | Admit: 2017-07-18 | Discharge: 2017-07-18 | Disposition: A | Discharge status: Home / Self Care | Payer: PPO | Source: Ambulatory Visit | Attending: Obstetrics and Gynecology | Admitting: Obstetrics and Gynecology

## 2017-07-18 ENCOUNTER — Ambulatory Visit (INDEPENDENT_AMBULATORY_CARE_PROVIDER_SITE_OTHER): Payer: PPO | Admitting: Obstetrics and Gynecology

## 2017-07-18 ENCOUNTER — Encounter: Payer: Self-pay | Admitting: Obstetrics and Gynecology

## 2017-07-18 VITALS — BP 126/60 | HR 82 | Resp 16 | Ht 65.25 in | Wt 146.0 lb

## 2017-07-18 DIAGNOSIS — M8580 Other specified disorders of bone density and structure, unspecified site: Secondary | ICD-10-CM

## 2017-07-18 DIAGNOSIS — Z124 Encounter for screening for malignant neoplasm of cervix: Secondary | ICD-10-CM | POA: Insufficient documentation

## 2017-07-18 DIAGNOSIS — E2839 Other primary ovarian failure: Secondary | ICD-10-CM

## 2017-07-18 DIAGNOSIS — F418 Other specified anxiety disorders: Secondary | ICD-10-CM | POA: Diagnosis not present

## 2017-07-18 DIAGNOSIS — Z01419 Encounter for gynecological examination (general) (routine) without abnormal findings: Secondary | ICD-10-CM | POA: Diagnosis not present

## 2017-07-18 DIAGNOSIS — Z0000 Encounter for general adult medical examination without abnormal findings: Secondary | ICD-10-CM | POA: Diagnosis not present

## 2017-07-18 DIAGNOSIS — R42 Dizziness and giddiness: Secondary | ICD-10-CM | POA: Diagnosis not present

## 2017-07-18 DIAGNOSIS — E559 Vitamin D deficiency, unspecified: Secondary | ICD-10-CM

## 2017-07-18 DIAGNOSIS — G479 Sleep disorder, unspecified: Secondary | ICD-10-CM

## 2017-07-18 LAB — POCT URINALYSIS DIPSTICK
Bilirubin, UA: NEGATIVE
Blood, UA: NEGATIVE
Glucose, UA: NEGATIVE
KETONES UA: NEGATIVE
LEUKOCYTES UA: NEGATIVE
Nitrite, UA: NEGATIVE
PH UA: 5 (ref 5.0–8.0)
Protein, UA: NEGATIVE
UROBILINOGEN UA: 0.2 U/dL

## 2017-07-18 MED ORDER — PROMETHAZINE HCL 12.5 MG PO TABS: 12.5000 mg | ORAL_TABLET | Freq: Four times a day (QID) | ORAL | 0 refills | Status: DC | PRN

## 2017-07-18 MED ORDER — CITALOPRAM HYDROBROMIDE 20 MG PO TABS: ORAL_TABLET | ORAL | 1 refills | Status: DC

## 2017-07-18 MED ORDER — BUPROPION HCL ER (XL) 150 MG PO TB24: 150.0000 mg | ORAL_TABLET | Freq: Every day | ORAL | 4 refills | Status: DC

## 2017-07-18 NOTE — Patient Instructions (Signed)

## 2017-07-18 NOTE — Progress Notes (Signed)
65 y.o. G1P0010 WidowedCaucasianF here for annual exam.  She has been a widow for 7 years. She was with her husband for 40 years. He died of Leukemia. He died within a month of diagnosis. 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  She has a h/o depression, has seen a counselor in the past. Has thought about starting back up. She is on Wellbutrin, still having mild depression. She does have mild anxiety as well. .     Patient's last menstrual period was 08/29/2002 (approximate).          Sexually active: No.  The current method of family planning is post menopausal status.    Exercising: Yes.    walk Smoker:  no  Health Maintenance: Pap:  02/11/14 Neg. HR HPV:neg   01/30/12 Neg  History of abnormal Pap:  no MMG: 02/02/16 BIRADS1:neg  Colonoscopy:  11/11/12 normal  BMD:   10/16/12 Osteopenia  TDaP:  4/15   reports that she has never smoked. She has never used smokeless tobacco. She reports that she drinks about 0.6 oz of alcohol per week . She reports that she does not use drugs.  She has 3 stepchildren, has a good relationship with her.    Past Medical History:  Diagnosis Date  . Cancer (HCC)    right leg, upper thigh AND CALF melanoma  . Depression   . Hypertension    Patient denies  . UTI (lower urinary tract infection)     Past Surgical History:  Procedure Laterality Date  . COLONOSCOPY    . MELANOMA EXCISION  2006   right thigh and calf  . REFRACTIVE SURGERY    . TONSILLECTOMY    . WISDOM TOOTH EXTRACTION      Current Outpatient Prescriptions  Medication Sig Dispense Refill  . buPROPion (WELLBUTRIN XL) 150 MG 24 hr tablet Take 1 tablet (150 mg total) by mouth daily. 90 tablet 4  . Calcium 1200-1000 MG-UNIT CHEW Chew by mouth.    . Multiple Vitamin (MULTIVITAMIN) capsule Take 1 capsule by mouth daily. Juice Plus    . Omega-3 Fatty Acids (FISH OIL) 1000 MG CAPS Take by mouth.    . traZODone (DESYREL) 50 MG tablet TAKE  1/2 TO 1 TABLET AT BEDTIME FOR SLEEP 90 tablet 4  . diazepam (VALIUM) 10 MG tablet Take 1 tablet (10 mg total) by mouth every 6 (six) hours as needed for anxiety. 1/2 -1 tablet for vertigo every 6 hours prn (Patient not taking: Reported on 07/18/2017) 30 tablet 0  . zolpidem (AMBIEN) 10 MG tablet Take 1 tablet (10 mg total) by mouth at bedtime as needed for sleep. (Patient not taking: Reported on 07/18/2017) 30 tablet 2   No current facility-administered medications for this visit.    She only takes the valium for vertigo. Hasn't had a bad one in a year. Not positional.  Family History  Problem Relation Age of Onset  . Cancer Mother        breast  . Stroke Mother   . Alcohol abuse Father   . Heart attack Father   . Other Sister 23       colon mass, benign  . Hypertension Sister   . Prostate cancer Brother   . Other Sister 56       colon mass  . Hypertension Sister   . Colon cancer Neg Hx     Review of Systems  Constitutional: Negative.   HENT:  Negative.   Eyes: Negative.   Respiratory: Negative.   Cardiovascular: Negative.   Gastrointestinal: Negative.   Endocrine: Positive for cold intolerance.  Genitourinary: Positive for urgency.  Musculoskeletal: Positive for myalgias.  Skin: Negative.   Allergic/Immunologic: Negative.   Neurological: Negative.   Hematological: Negative.   Psychiatric/Behavioral:       Depression    Exam:   BP 126/60 (BP Location: Right Arm, Patient Position: Sitting, Cuff Size: Normal)   Pulse 82   Resp 16   Ht 5' 5.25" (1.657 m)   Wt 146 lb (66.2 kg)   LMP 08/29/2002 (Approximate)   BMI 24.11 kg/m   Weight change: @WEIGHTCHANGE @ Height:   Height: 5' 5.25" (165.7 cm)  Ht Readings from Last 3 Encounters:  07/18/17 5' 5.25" (1.657 m)  07/16/16 5' 5.25" (1.657 m)  01/05/16 5\' 6"  (1.676 m)    General appearance: alert, cooperative and appears stated age Head: Normocephalic, without obvious abnormality, atraumatic Neck: no adenopathy, supple,  symmetrical, trachea midline and thyroid normal to inspection and palpation Lungs: clear to auscultation bilaterally Cardiovascular: regular rate and rhythm Breasts: normal appearance, no masses or tenderness Abdomen: soft, non-tender; non distended,  no masses,  no organomegaly Extremities: extremities normal, atraumatic, no cyanosis or edema Skin: Skin color, texture, turgor normal. No rashes or lesions Lymph nodes: Cervical, supraclavicular, and axillary nodes normal. No abnormal inguinal nodes palpated Neurologic: Grossly normal   Pelvic: External genitalia:  no lesions              Urethra:  normal appearing urethra with no masses, tenderness or lesions              Bartholins and Skenes: normal                 Vagina: normal appearing atrophic vagina with normal color and discharge, no lesions              Cervix: no lesions               Bimanual Exam:  Uterus:  normal size, contour, position, consistency, mobility, non-tender              Adnexa: no mass, fullness, tenderness               Rectovaginal: Confirms               Anus:  normal sphincter tone, no lesions  Chaperone was present for exam.  A:  Well Woman with normal exam  Depression/anxiety  Sleep disturbance  Osteopenia  Vit d def  P:   Pap with hpv  Phenergan for her vertigo  Start a low dose of Celexa   Continue Wellbutrin  Mammogram  DEXA  Stop the trazodone for now (can't take with Celexa), she will f/u with her primary  Discussed breast self exam  Discussed calcium and vit D intake  Screening labs, vit D

## 2017-07-19 LAB — COMPREHENSIVE METABOLIC PANEL
A/G RATIO: 2.1 (ref 1.2–2.2)
ALK PHOS: 79 IU/L (ref 39–117)
ALT: 12 IU/L (ref 0–32)
AST: 17 IU/L (ref 0–40)
Albumin: 4.5 g/dL (ref 3.6–4.8)
BILIRUBIN TOTAL: 0.3 mg/dL (ref 0.0–1.2)
BUN/Creatinine Ratio: 16 (ref 12–28)
BUN: 15 mg/dL (ref 8–27)
CHLORIDE: 100 mmol/L (ref 96–106)
CO2: 24 mmol/L (ref 20–29)
Calcium: 9.8 mg/dL (ref 8.7–10.3)
Creatinine, Ser: 0.94 mg/dL (ref 0.57–1.00)
GFR calc Af Amer: 73 mL/min/{1.73_m2} (ref >59)
GFR calc non Af Amer: 63 mL/min/{1.73_m2} (ref >59)
GLUCOSE: 104 mg/dL — AB (ref 65–99)
Globulin, Total: 2.1 g/dL (ref 1.5–4.5)
POTASSIUM: 4.4 mmol/L (ref 3.5–5.2)
Sodium: 139 mmol/L (ref 134–144)
Total Protein: 6.6 g/dL (ref 6.0–8.5)

## 2017-07-19 LAB — CBC
Hematocrit: 39.9 % (ref 34.0–46.6)
Hemoglobin: 12.9 g/dL (ref 11.1–15.9)
MCH: 26.9 pg (ref 26.6–33.0)
MCHC: 32.3 g/dL (ref 31.5–35.7)
MCV: 83 fL (ref 79–97)
PLATELETS: 322 10*3/uL (ref 150–379)
RBC: 4.79 x10E6/uL (ref 3.77–5.28)
RDW: 14.6 % (ref 12.3–15.4)
WBC: 7.6 10*3/uL (ref 3.4–10.8)

## 2017-07-19 LAB — LIPID PANEL
CHOLESTEROL TOTAL: 223 mg/dL — AB (ref 100–199)
Chol/HDL Ratio: 2.9 ratio (ref 0.0–4.4)
HDL: 76 mg/dL (ref >39)
LDL Calculated: 128 mg/dL — ABNORMAL HIGH (ref 0–99)
Triglycerides: 93 mg/dL (ref 0–149)
VLDL CHOLESTEROL CAL: 19 mg/dL (ref 5–40)

## 2017-07-19 LAB — CYTOLOGY - PAP: DIAGNOSIS: NEGATIVE

## 2017-07-19 LAB — VITAMIN D 25 HYDROXY (VIT D DEFICIENCY, FRACTURES): VIT D 25 HYDROXY: 38.7 ng/mL (ref 30.0–100.0)

## 2017-07-23 ENCOUNTER — Other Ambulatory Visit: Payer: Self-pay | Admitting: Obstetrics and Gynecology

## 2017-07-23 DIAGNOSIS — Z1231 Encounter for screening mammogram for malignant neoplasm of breast: Secondary | ICD-10-CM

## 2017-08-14 ENCOUNTER — Ambulatory Visit
Admission: RE | Admit: 2017-08-14 | Discharge: 2017-08-14 | Disposition: A | Discharge status: Home / Self Care | Payer: PPO | Source: Ambulatory Visit | Attending: Obstetrics and Gynecology | Admitting: Obstetrics and Gynecology

## 2017-08-14 DIAGNOSIS — M8589 Other specified disorders of bone density and structure, multiple sites: Secondary | ICD-10-CM | POA: Diagnosis not present

## 2017-08-14 DIAGNOSIS — Z1231 Encounter for screening mammogram for malignant neoplasm of breast: Secondary | ICD-10-CM

## 2017-08-14 DIAGNOSIS — E2839 Other primary ovarian failure: Secondary | ICD-10-CM

## 2017-08-14 DIAGNOSIS — Z780 Asymptomatic menopausal state: Secondary | ICD-10-CM | POA: Diagnosis not present

## 2017-08-14 DIAGNOSIS — M8580 Other specified disorders of bone density and structure, unspecified site: Secondary | ICD-10-CM

## 2017-08-15 ENCOUNTER — Encounter: Payer: Self-pay | Admitting: Obstetrics and Gynecology

## 2017-08-16 ENCOUNTER — Other Ambulatory Visit: Payer: Self-pay | Admitting: Obstetrics and Gynecology

## 2017-08-16 ENCOUNTER — Telehealth: Payer: Self-pay

## 2017-08-16 DIAGNOSIS — R928 Other abnormal and inconclusive findings on diagnostic imaging of breast: Secondary | ICD-10-CM

## 2017-08-16 NOTE — Telephone Encounter (Signed)
Erroneous encounter

## 2017-08-26 ENCOUNTER — Ambulatory Visit
Admission: RE | Admit: 2017-08-26 | Discharge: 2017-08-26 | Disposition: A | Discharge status: Home / Self Care | Payer: Self-pay | Source: Ambulatory Visit | Attending: Obstetrics and Gynecology | Admitting: Obstetrics and Gynecology

## 2017-08-26 ENCOUNTER — Other Ambulatory Visit: Payer: Self-pay | Admitting: Obstetrics and Gynecology

## 2017-08-26 ENCOUNTER — Ambulatory Visit (INDEPENDENT_AMBULATORY_CARE_PROVIDER_SITE_OTHER): Payer: PPO | Admitting: Obstetrics and Gynecology

## 2017-08-26 ENCOUNTER — Ambulatory Visit: Payer: Self-pay

## 2017-08-26 ENCOUNTER — Ambulatory Visit
Admission: RE | Admit: 2017-08-26 | Discharge: 2017-08-26 | Disposition: A | Discharge status: Home / Self Care | Payer: PPO | Source: Ambulatory Visit | Attending: Obstetrics and Gynecology | Admitting: Obstetrics and Gynecology

## 2017-08-26 ENCOUNTER — Encounter: Payer: Self-pay | Admitting: Obstetrics and Gynecology

## 2017-08-26 VITALS — BP 138/70 | HR 88 | Resp 18 | Wt 147.0 lb

## 2017-08-26 DIAGNOSIS — F418 Other specified anxiety disorders: Secondary | ICD-10-CM

## 2017-08-26 DIAGNOSIS — R928 Other abnormal and inconclusive findings on diagnostic imaging of breast: Secondary | ICD-10-CM | POA: Diagnosis not present

## 2017-08-26 DIAGNOSIS — N63 Unspecified lump in unspecified breast: Secondary | ICD-10-CM

## 2017-08-26 DIAGNOSIS — M8580 Other specified disorders of bone density and structure, unspecified site: Secondary | ICD-10-CM | POA: Diagnosis not present

## 2017-08-26 DIAGNOSIS — N6002 Solitary cyst of left breast: Secondary | ICD-10-CM | POA: Diagnosis not present

## 2017-08-26 MED ORDER — ALENDRONATE SODIUM 70 MG PO TABS: 70.0000 mg | ORAL_TABLET | ORAL | 11 refills | Status: DC

## 2017-08-26 MED ORDER — CITALOPRAM HYDROBROMIDE 20 MG PO TABS: ORAL_TABLET | ORAL | 1 refills | Status: DC

## 2017-08-26 NOTE — Progress Notes (Signed)
GYNECOLOGY  VISIT   HPI: 66 y.o.   Widowed  Caucasian  female   [place] with Patient's last menstrual period was 08/29/2002 (approximate).   here for follow up on depression and DEXA results    Recent DEXA returned with a FRAX risk of 21% of any fracture in 10 years and a 4% risk of a hip fracture in 10 years. Compared to her last DEXA in 2013 she has an increase of the bone density of her spine and no significant change of her bone density of her hip. Last month she was started on Celexa for depression and anxiety (this was added to Wellbutrin). She took 1/2 a tablet a day for 2 weeks. The first week she felt very down, no energy. Feeling better now. Not sure the depression is better, does feel a little less anxious. She has had a recall on mammogram, last week she was at the beach. Prior to starting the Celexa, she wasn't feeling like the Wellbutrin was working any more. She is willing to give the Celexa time to work. 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  She leads a support group. Really misses her husband, not sure she would date. Has no biological children, but has step-children, is close with her step daughter in [place].  Recent normal Vit d. Just in the last month she is getting 1,200 mg. Her vit d is normal. She exercises.  She has no h/o GERD, does note occasional reflux.   GYNECOLOGIC HISTORY: Patient's last menstrual period was 08/29/2002 (approximate). Contraception:postmenopause  Menopausal hormone therapy: none         OB History    Gravida Para Term Preterm AB Living   1 0 0 0 1 0   SAB TAB Ectopic Multiple Live Births   0 1 0 0 0         Patient Active Problem List   Diagnosis Date Noted  . Osteopenia 02/11/2014  . Benign paroxysmal positional vertigo 02/11/2014  . Melanoma ([place]) 03/31/2012  . History of depression 03/31/2012  . Elevated blood pressure 03/31/2012    Past Medical History:  Diagnosis Date  . Cancer (HCC)     right leg, upper thigh AND CALF melanoma  . Depression   . Hypertension    Patient denies  . UTI (lower urinary tract infection)     Past Surgical History:  Procedure Laterality Date  . BREAST BIOPSY Left   . COLONOSCOPY    . MELANOMA EXCISION  2006   right thigh and calf  . REFRACTIVE SURGERY    . TONSILLECTOMY    . WISDOM TOOTH EXTRACTION      Current Outpatient Prescriptions  Medication Sig Dispense Refill  . buPROPion (WELLBUTRIN XL) 150 MG 24 hr tablet Take 1 tablet (150 mg total) by mouth daily. 90 tablet 4  . Calcium 1200-1000 MG-UNIT CHEW Chew by mouth.    . citalopram (CELEXA) 20 MG tablet Start with 1/2 a tablet a day, if in 2 weeks you are tolerating the medication you can increase to one tablet a day. 30 tablet 1  . Multiple Vitamin (MULTIVITAMIN) capsule Take 1 capsule by mouth daily. Juice Plus    . Omega-3 Fatty Acids (FISH OIL) 1000 MG CAPS Take by mouth.    . promethazine (PHENERGAN) 12.5 MG tablet Take 1 tablet (12.5 mg total) by mouth every 6 (six) hours as needed for nausea or vomiting. 30 tablet 0   No current  facility-administered medications for this visit.      ALLERGIES: Keflex [cephalexin]  Family History  Problem Relation Age of Onset  . Cancer Mother        breast  . Stroke Mother   . Breast cancer Mother   . Alcohol abuse Father   . Heart attack Father   . Other Sister 6       colon mass, benign  . Hypertension Sister   . Prostate cancer Brother   . Other Sister 72       colon mass  . Hypertension Sister   . Colon cancer Neg Hx     Social History   Social History  . Marital status: Widowed    Spouse name: N/A  . Number of children: N/A  . Years of education: N/A   Occupational History  . Not on file.   Social History Main Topics  . Smoking status: Never Smoker  . Smokeless tobacco: Never Used  . Alcohol use 0.6 oz/week    1 Glasses of wine per week  . Drug use: No  . Sexual activity: No   Other Topics Concern  .  Not on file   Social History Narrative  . No narrative on file    Review of Systems  Constitutional: Negative.   HENT: Negative.   Eyes: Negative.   Respiratory: Negative.   Cardiovascular: Negative.   Gastrointestinal: Negative.   Genitourinary: Negative.   Musculoskeletal: Negative.   Skin: Negative.   Neurological: Negative.   Endo/Heme/Allergies: Negative.   Psychiatric/Behavioral: Positive for depression.    PHYSICAL EXAMINATION:    BP 138/70 (BP Location: Right Arm, Patient Position: Sitting, Cuff Size: Normal)   Pulse 88   Resp 18   Wt 147 lb (66.7 kg)   LMP 08/29/2002 (Approximate)   BMI 24.27 kg/m     General appearance: alert, cooperative and appears stated age  ASSESSMENT Depression and anxiety, some mild improvement Osteopenia, elevated risk of fracture    PLAN Continue Celexa for now If she isn't feeling better by the first of the year, will send to Psychiatry Discussed DEXA, discussed treatment, she will try the Fosamax    An After Visit Summary was printed and given to the patient.  Over 30 minutes face to face time of which over 50% was spent in counseling.

## 2017-08-26 NOTE — Patient Instructions (Signed)

## 2017-11-06 ENCOUNTER — Other Ambulatory Visit: Payer: Self-pay

## 2017-11-06 ENCOUNTER — Ambulatory Visit (INDEPENDENT_AMBULATORY_CARE_PROVIDER_SITE_OTHER): Payer: PPO | Admitting: Obstetrics and Gynecology

## 2017-11-06 ENCOUNTER — Encounter: Payer: Self-pay | Admitting: Obstetrics and Gynecology

## 2017-11-06 VITALS — BP 138/78 | HR 76 | Resp 14 | Wt 151.0 lb

## 2017-11-06 DIAGNOSIS — F418 Other specified anxiety disorders: Secondary | ICD-10-CM | POA: Diagnosis not present

## 2017-11-06 MED ORDER — CITALOPRAM HYDROBROMIDE 20 MG PO TABS: ORAL_TABLET | ORAL | 2 refills | Status: DC

## 2017-11-06 MED ORDER — ALENDRONATE SODIUM 70 MG PO TABS: 70.0000 mg | ORAL_TABLET | ORAL | 2 refills | Status: DC

## 2017-11-06 NOTE — Progress Notes (Signed)
GYNECOLOGY  VISIT   HPI: 67 y.o.   Widowed  Caucasian  female   [place] with Patient's last menstrual period was 08/29/2002 (approximate).   here for follow up on anxiety and depression. In the fall Celexa was added to her Wellbutrin to help with her symptoms.  She is a Widow and the holidays are hard for her. She made it through the holidays, this one was a little easier than the last several years. She was with her step-daughter and her family which was nice.  She doesn't feel as down, able to function. Doesn't feel she has had a lot of anxiety. She has some decisions she needs to make, may need to step away from the Widow group.  She has seen a counselor in the past, not currently. Not finding joy in life, no enthusiasm for anything, no spark, nothing excites her.  She has increased her exercise and walking.    GYNECOLOGIC HISTORY: Patient's last menstrual period was 08/29/2002 (approximate). Contraception:postmenopause  Menopausal hormone therapy: none         OB History    Gravida Para Term Preterm AB Living   1 0 0 0 1 0   SAB TAB Ectopic Multiple Live Births   0 1 0 0 0         Patient Active Problem List   Diagnosis Date Noted  . Osteopenia 02/11/2014  . Benign paroxysmal positional vertigo 02/11/2014  . Melanoma ([place]) 03/31/2012  . History of depression 03/31/2012  . Elevated blood pressure 03/31/2012    Past Medical History:  Diagnosis Date  . Cancer (HCC)    right leg, upper thigh AND CALF melanoma  . Depression   . Hypertension    Patient denies  . UTI (lower urinary tract infection)     Past Surgical History:  Procedure Laterality Date  . BREAST BIOPSY Left   . COLONOSCOPY    . MELANOMA EXCISION  2006   right thigh and calf  . REFRACTIVE SURGERY    . TONSILLECTOMY    . WISDOM TOOTH EXTRACTION      Current Outpatient Medications  Medication Sig Dispense Refill  . alendronate (FOSAMAX) 70 MG tablet Take 1 tablet (70 mg total) by mouth every 7 (seven)  days. Take with a full glass of water on an empty stomach. 4 tablet 11  . buPROPion (WELLBUTRIN XL) 150 MG 24 hr tablet Take 1 tablet (150 mg total) by mouth daily. 90 tablet 4  . Calcium 1200-1000 MG-UNIT CHEW Chew by mouth.    . citalopram (CELEXA) 20 MG tablet Start with 1/2 a tablet a day, if in 2 weeks you are tolerating the medication you can increase to one tablet a day. 30 tablet 1  . Multiple Vitamin (MULTIVITAMIN) capsule Take 1 capsule by mouth daily. Juice Plus    . Omega-3 Fatty Acids (FISH OIL) 1000 MG CAPS Take by mouth.    . promethazine (PHENERGAN) 12.5 MG tablet Take 1 tablet (12.5 mg total) by mouth every 6 (six) hours as needed for nausea or vomiting. 30 tablet 0   No current facility-administered medications for this visit.      ALLERGIES: Keflex [cephalexin] and Sulfa antibiotics  Family History  Problem Relation Age of Onset  . Cancer Mother        breast  . Stroke Mother   . Breast cancer Mother   . Alcohol abuse Father   . Heart attack Father   . Other Sister 5  colon mass, benign  . Hypertension Sister   . Prostate cancer Brother   . Other Sister 51       colon mass  . Hypertension Sister   . Colon cancer Neg Hx     Social History   Socioeconomic History  . Marital status: Widowed    Spouse name: Not on file  . Number of children: Not on file  . Years of education: Not on file  . Highest education level: Not on file  Social Needs  . Financial resource strain: Not on file  . Food insecurity - worry: Not on file  . Food insecurity - inability: Not on file  . Transportation needs - medical: Not on file  . Transportation needs - non-medical: Not on file  Occupational History  . Not on file  Tobacco Use  . Smoking status: Never Smoker  . Smokeless tobacco: Never Used  Substance and Sexual Activity  . Alcohol use: Yes    Alcohol/week: 0.6 oz    Types: 1 Glasses of wine per week  . Drug use: No  . Sexual activity: No    Birth  control/protection: Post-menopausal  Other Topics Concern  . Not on file  Social History Narrative  . Not on file    Review of Systems  Constitutional: Negative.   HENT: Negative.   Eyes: Negative.   Respiratory: Negative.   Cardiovascular: Negative.   Gastrointestinal: Negative.   Genitourinary: Negative.   Musculoskeletal: Negative.   Skin: Negative.   Neurological: Negative.   Endo/Heme/Allergies: Negative.   Psychiatric/Behavioral: Positive for depression.    PHYSICAL EXAMINATION:    BP 138/78 (BP Location: Right Arm, Patient Position: Sitting, Cuff Size: Normal)   Pulse 76   Resp 14   Wt 151 lb (68.5 kg)   LMP 08/29/2002 (Approximate)   BMI 24.94 kg/m     General appearance: alert, cooperative and appears stated age  ASSESSMENT Depression and anxiety. Anxiety is better, depression has improved, but still there Tolerating her fosamax, requests a 3 month supply with refills instead of filling it monthly    PLAN Refer to Psychiatry for possible medication adjustments F/U for counseling Refill medication   An After Visit Summary was printed and given to the patient.  ~15 minutes face to face time of which over 50% was spent in counseling.

## 2017-11-13 ENCOUNTER — Telehealth: Payer: Self-pay | Admitting: *Deleted

## 2017-11-13 NOTE — Telephone Encounter (Signed)
Call to patient on mobile and home numbers, no answer. Left message on home # to call Sharee Pimple at [phone].   Ok to leave detailed message on mobile #, left message to return call to Maxwell at [facility] in regards to referral to [facility].    [facility] unable to reach patient to schedule appt. Left message on 11/08/17, no return call.

## 2017-11-18 ENCOUNTER — Encounter: Payer: Self-pay | Admitting: Internal Medicine

## 2017-11-19 NOTE — Telephone Encounter (Signed)
Spoke with patient. Patient reports she is "doing well". Patient states she has returned call to [facility] and is scheduled for mid February. Patient states she is unsure of date, driving and can not look at calendar.   Advised patient will update Dr. Nelson Chimes. Return call to office with any additional questions/concerns.   Routing to provider for final review. Patient is agreeable to disposition. Will close encounter.   Cc: Lerry Liner

## 2017-11-22 ENCOUNTER — Other Ambulatory Visit: Payer: Self-pay | Admitting: Obstetrics and Gynecology

## 2017-11-22 NOTE — Telephone Encounter (Signed)
Patient called and requested refills on her citalpram to be sent to the pharmacy on file She said she only has enough to last until tomorrow, 11/23/17. She also said the pharmacy did not give her the option for them to contact our office on her behalf when she called in the request to the pharmacy.   Okay to leave a message on her voice mail, per patient.

## 2017-11-22 NOTE — Telephone Encounter (Signed)
Message left to return call to Ankith Edmonston at [phone].    

## 2017-11-22 NOTE — Telephone Encounter (Signed)
Call to patient. Advised Dr. Talbert Nan had sent prescription in on 11/06/17 for 90 day supply with 2 RF. Patient states she called pharmacy this morning to refill and she states she didn't get the prompts she normally does when refilling, stating med could not be refilled. Patient states she will contact pharmacy when they open and return call if she needs any additional assistance.

## 2017-12-04 DIAGNOSIS — M25521 Pain in right elbow: Secondary | ICD-10-CM | POA: Diagnosis not present

## 2017-12-04 DIAGNOSIS — S53491A Other sprain of right elbow, initial encounter: Secondary | ICD-10-CM | POA: Diagnosis not present

## 2017-12-11 DIAGNOSIS — Z85820 Personal history of malignant melanoma of skin: Secondary | ICD-10-CM | POA: Diagnosis not present

## 2017-12-11 DIAGNOSIS — D225 Melanocytic nevi of trunk: Secondary | ICD-10-CM | POA: Diagnosis not present

## 2017-12-11 DIAGNOSIS — D2271 Melanocytic nevi of right lower limb, including hip: Secondary | ICD-10-CM | POA: Diagnosis not present

## 2017-12-11 DIAGNOSIS — Z23 Encounter for immunization: Secondary | ICD-10-CM | POA: Diagnosis not present

## 2017-12-11 DIAGNOSIS — S53491D Other sprain of right elbow, subsequent encounter: Secondary | ICD-10-CM | POA: Diagnosis not present

## 2017-12-11 DIAGNOSIS — Z86018 Personal history of other benign neoplasm: Secondary | ICD-10-CM | POA: Diagnosis not present

## 2017-12-11 DIAGNOSIS — L821 Other seborrheic keratosis: Secondary | ICD-10-CM | POA: Diagnosis not present

## 2017-12-11 DIAGNOSIS — D2272 Melanocytic nevi of left lower limb, including hip: Secondary | ICD-10-CM | POA: Diagnosis not present

## 2017-12-11 DIAGNOSIS — M25521 Pain in right elbow: Secondary | ICD-10-CM | POA: Diagnosis not present

## 2017-12-11 DIAGNOSIS — L570 Actinic keratosis: Secondary | ICD-10-CM | POA: Diagnosis not present

## 2017-12-11 DIAGNOSIS — L814 Other melanin hyperpigmentation: Secondary | ICD-10-CM | POA: Diagnosis not present

## 2017-12-11 DIAGNOSIS — D1800 Hemangioma unspecified site: Secondary | ICD-10-CM | POA: Diagnosis not present

## 2017-12-16 DIAGNOSIS — F331 Major depressive disorder, recurrent, moderate: Secondary | ICD-10-CM | POA: Diagnosis not present

## 2018-01-20 DIAGNOSIS — F331 Major depressive disorder, recurrent, moderate: Secondary | ICD-10-CM | POA: Diagnosis not present

## 2018-02-27 ENCOUNTER — Other Ambulatory Visit: Payer: Self-pay | Admitting: Obstetrics and Gynecology

## 2018-02-27 ENCOUNTER — Ambulatory Visit
Admission: RE | Admit: 2018-02-27 | Discharge: 2018-02-27 | Disposition: A | Discharge status: Home / Self Care | Payer: PPO | Source: Ambulatory Visit | Attending: Obstetrics and Gynecology | Admitting: Obstetrics and Gynecology

## 2018-02-27 DIAGNOSIS — N63 Unspecified lump in unspecified breast: Secondary | ICD-10-CM

## 2018-02-27 DIAGNOSIS — N6310 Unspecified lump in the right breast, unspecified quadrant: Secondary | ICD-10-CM

## 2018-02-27 DIAGNOSIS — F331 Major depressive disorder, recurrent, moderate: Secondary | ICD-10-CM | POA: Diagnosis not present

## 2018-02-27 DIAGNOSIS — N6001 Solitary cyst of right breast: Secondary | ICD-10-CM | POA: Diagnosis not present

## 2018-04-03 DIAGNOSIS — F331 Major depressive disorder, recurrent, moderate: Secondary | ICD-10-CM | POA: Diagnosis not present

## 2018-05-05 ENCOUNTER — Other Ambulatory Visit: Payer: Self-pay | Admitting: Obstetrics and Gynecology

## 2018-05-05 NOTE — Telephone Encounter (Signed)
Medication refill request: Bupropion 150 mg Last AEX:  07/18/2017 Next AEX: 07/24/2018 Last MMG (if hormonal medication request): 02/27/2018 Birads 3: probably benign, follow up in Nov 2019 Refill authorized: Last given 07/18/2017 #[id number]

## 2018-05-27 ENCOUNTER — Ambulatory Visit (INDEPENDENT_AMBULATORY_CARE_PROVIDER_SITE_OTHER): Payer: PPO | Admitting: Family Medicine

## 2018-05-27 ENCOUNTER — Encounter: Payer: Self-pay | Admitting: Family Medicine

## 2018-05-27 VITALS — BP 140/80 | HR 91 | Temp 98.3°F | Wt 161.8 lb

## 2018-05-27 DIAGNOSIS — R490 Dysphonia: Secondary | ICD-10-CM | POA: Diagnosis not present

## 2018-05-27 DIAGNOSIS — K219 Gastro-esophageal reflux disease without esophagitis: Secondary | ICD-10-CM | POA: Diagnosis not present

## 2018-05-27 DIAGNOSIS — R058 Other specified cough: Secondary | ICD-10-CM

## 2018-05-27 DIAGNOSIS — R05 Cough: Secondary | ICD-10-CM | POA: Diagnosis not present

## 2018-05-27 NOTE — Patient Instructions (Signed)
Food Choices for Gastroesophageal Reflux Disease, Adult When you have gastroesophageal reflux disease (GERD), the foods you eat and your eating habits are very important. Choosing the right foods can help ease the discomfort of GERD. Consider working with a diet and nutrition specialist (dietitian) to help you make healthy food choices. What general guidelines should I follow? Eating plan  Choose healthy foods low in fat, such as fruits, vegetables, whole grains, low-fat dairy products, and lean meat, fish, and poultry.  Eat frequent, small meals instead of three large meals each day. Eat your meals slowly, in a relaxed setting. Avoid bending over or lying down until 2-3 hours after eating.  Limit high-fat foods such as fatty meats or fried foods.  Limit your intake of oils, butter, and shortening to less than 8 teaspoons each day.  Avoid the following: ? Foods that cause symptoms. These may be different for different people. Keep a food diary to keep track of foods that cause symptoms. ? Alcohol. ? Drinking large amounts of liquid with meals. ? Eating meals during the 2-3 hours before bed.  Cook foods using methods other than frying. This may include baking, grilling, or broiling. Lifestyle   Maintain a healthy weight. Ask your health care provider what weight is healthy for you. If you need to lose weight, work with your health care provider to do so safely.  Exercise for at least 30 minutes on 5 or more days each week, or as told by your health care provider.  Avoid wearing clothes that fit tightly around your waist and chest.  Do not use any products that contain nicotine or tobacco, such as cigarettes and e-cigarettes. If you need help quitting, ask your health care provider.  Sleep with the head of your bed raised. Use a wedge under the mattress or blocks under the bed frame to raise the head of the bed. What foods are not recommended? The items listed may not be a complete  list. Talk with your dietitian about what dietary choices are best for you. Grains Pastries or quick breads with added fat. French toast. Vegetables Deep fried vegetables. French fries. Any vegetables prepared with added fat. Any vegetables that cause symptoms. For some people this may include tomatoes and tomato products, chili peppers, onions and garlic, and horseradish. Fruits Any fruits prepared with added fat. Any fruits that cause symptoms. For some people this may include citrus fruits, such as oranges, grapefruit, pineapple, and lemons. Meats and other protein foods High-fat meats, such as fatty beef or pork, hot dogs, ribs, ham, sausage, salami and bacon. Fried meat or protein, including fried fish and fried chicken. Nuts and nut butters. Dairy Whole milk and chocolate milk. Sour cream. Cream. Ice cream. Cream cheese. Milk shakes. Beverages Coffee and tea, with or without caffeine. Carbonated beverages. Sodas. Energy drinks. Fruit juice made with acidic fruits (such as orange or grapefruit). Tomato juice. Alcoholic drinks. Fats and oils Butter. Margarine. Shortening. Ghee. Sweets and desserts Chocolate and cocoa. Donuts. Seasoning and other foods Pepper. Peppermint and spearmint. Any condiments, herbs, or seasonings that cause symptoms. For some people, this may include curry, hot sauce, or vinegar-based salad dressings. Summary  When you have gastroesophageal reflux disease (GERD), food and lifestyle choices are very important to help ease the discomfort of GERD.  Eat frequent, small meals instead of three large meals each day. Eat your meals slowly, in a relaxed setting. Avoid bending over or lying down until 2-3 hours after eating.  Limit high-fat   foods such as fatty meat or fried foods. This information is not intended to replace advice given to you by your health care provider. Make sure you discuss any questions you have with your health care provider. Document Released:  10/15/2005 Document Revised: 10/16/2016 Document Reviewed: 10/16/2016 Elsevier Interactive Patient Education  2018 [employer].  Consider elevate head of bed 4-6 inches Avoid mint products Avoid eating within 2-3 hours of bedtime Consider trial of OTC Prilosec or Nexium one daily Let me know if cough not improving in 2-3 weeks.

## 2018-05-27 NOTE — Progress Notes (Signed)
Subjective:     Patient ID: Lori Harvey, female   DOB: 30-Mar-1951, 67 y.o.   MRN: [id number]  HPI Patient seen with some chronic cough. She states she's had about 8 months of dry cough. Denies any appetite changes, dyspnea, hemoptysis, pleuritic pain. Has never smoked. She has had some occasional postnasal drip symptoms. Not currently taking anything for that. She states that she has recently developed some GERD-like symptoms. She's had some intermittent hoarseness and frequent sensation of need to clear throat. She is aware of some obvious regurgitation in her throat at times.  No wheezing. No chronic sinusitis type symptoms. No dysphagia. No abdominal pain.  Drinks sometimes one glass of wine at night. Moderate caffeine use. Frequent use of mints recently.  No ACE inhibitor use. She complains of frequent dry mouth symptoms which she attributes to medications including current use of Wellbutrin and Cymbalta per psychiatry.  Occasional fleeting headaches left occipital area. These are sometimes related to things like coughing. She's never had exertional headache. No nausea or vomiting. She feels pain is superficial and almost more in the scalp region. Has not noted any visible rashes.  Past Medical History:  Diagnosis Date  . Cancer (HCC)    right leg, upper thigh AND CALF melanoma  . Depression   . Hypertension    Patient denies  . UTI (lower urinary tract infection)    Past Surgical History:  Procedure Laterality Date  . BREAST BIOPSY Left   . COLONOSCOPY    . MELANOMA EXCISION  2006   right thigh and calf  . REFRACTIVE SURGERY    . TONSILLECTOMY    . WISDOM TOOTH EXTRACTION      reports that she has never smoked. She has never used smokeless tobacco. She reports that she drinks about 0.6 oz of alcohol per week. She reports that she does not use drugs. family history includes Alcohol abuse in her father; Breast cancer in her mother; Cancer in her mother; Heart attack in her  father; Hypertension in her sister and sister; Other (age of onset: 58) in her sister; Other (age of onset: 31) in her sister; Prostate cancer in her brother; Stroke in her mother. Allergies  Allergen Reactions  . Keflex [Cephalexin]     Nausea, hives  . Sulfa Antibiotics Rash     Review of Systems  Constitutional: Negative for appetite change and unexpected weight change.  HENT: Positive for postnasal drip and voice change. Negative for sinus pressure, sinus pain and sneezing.   Respiratory: Positive for cough. Negative for shortness of breath and wheezing.   Cardiovascular: Negative for chest pain, palpitations and leg swelling.  Gastrointestinal: Negative for abdominal pain.  Hematological: Negative for adenopathy.       Objective:   Physical Exam  Constitutional: She appears well-developed and well-nourished.  HENT:  Right Ear: Tympanic membrane normal.  Left Ear: Tympanic membrane normal.  Mouth/Throat: Oropharynx is clear and moist and mucous membranes are normal. No oropharyngeal exudate.  Neck: Normal range of motion. Neck supple.  Cardiovascular: Normal rate and regular rhythm.  Pulmonary/Chest: Effort normal and breath sounds normal. No respiratory distress. She has no wheezes. She has no rales.  Lymphadenopathy:    She has no cervical adenopathy.       Assessment:     Patient resents with several month history of dry cough. She does not have any red flags such as appetite change, weight loss, fever, hemoptysis, dyspnea. She is also describing somewhat frequent GERD-type symptoms. Intermittent  hoarseness likely related to this. Differential for chronic cough also is postnasal drip related    Plan:     -Elevate head of bed 4-6 inches -Avoid eating within 2-3 hours of bedtime -Avoid peppermint and spearmint products -Recommended over-the-counter Nexium or Prilosec once daily -Discussed dietary management of GERD. Gradually reduce caffeine and alcohol -Touch base  if cough not improving of the next several weeks. Recommend chest x-ray if not improved in 3-4 weeks  Eulas Post MD [facility]

## 2018-06-11 ENCOUNTER — Other Ambulatory Visit: Payer: Self-pay | Admitting: Obstetrics and Gynecology

## 2018-06-11 NOTE — Telephone Encounter (Signed)
I sent in 90 tablets of the Celexa in January with 2 refills. The Celexa has been discontinued in Epic. Please check with the patient if she is taking it. If she needs a refill it shouldn't be more than one months worth.

## 2018-06-11 NOTE — Telephone Encounter (Signed)
Medication refill request: Celexa 20mg   Last AEX:  07/18/17 Next AEX: 08/06/18 Last MMG (if  hormonal medication request):  Bi-rads Category 3 probably benign 02/27/18 Refill authorized: Please refill if appropriate.

## 2018-06-12 NOTE — Telephone Encounter (Signed)
Spoke with patient regarding citalopram refill request from [facility].  Per patient she is no longer taking the medication and does not need refills at this time.

## 2018-07-03 ENCOUNTER — Telehealth: Payer: Self-pay | Admitting: *Deleted

## 2018-07-03 NOTE — Telephone Encounter (Signed)
Message left to return call to Omah Dewalt at [phone].    

## 2018-07-07 NOTE — Telephone Encounter (Signed)
Patient is returning a call to [place]. Patient states she does not need this refill, no need to return the call.

## 2018-07-08 NOTE — Telephone Encounter (Signed)
Message noted.  Encounter closed.

## 2018-07-14 DIAGNOSIS — F3342 Major depressive disorder, recurrent, in full remission: Secondary | ICD-10-CM | POA: Insufficient documentation

## 2018-07-24 ENCOUNTER — Ambulatory Visit: Payer: PPO | Admitting: Obstetrics and Gynecology

## 2018-07-28 ENCOUNTER — Other Ambulatory Visit: Payer: Self-pay | Admitting: Obstetrics and Gynecology

## 2018-08-04 ENCOUNTER — Ambulatory Visit: Payer: PPO | Admitting: Psychiatry

## 2018-08-04 IMAGING — MG 2D DIGITAL DIAGNOSTIC BILATERAL MAMMOGRAM WITH CAD AND ADJUNCT T
3 series · 3 of 7 positions shown · non-contrast
Comparison: August 26, 2017 and earlier priors

CLINICAL DATA: 65-year-old patient recalled from recent screening
mammogram for evaluation possible asymmetry in the left breast and
possible masses in the right breast.

EXAM:
2D DIGITAL DIAGNOSTIC BILATERAL MAMMOGRAM WITH CAD AND ADJUNCT TOMO
ULTRASOUND RIGHT BREAST

[L MLO]
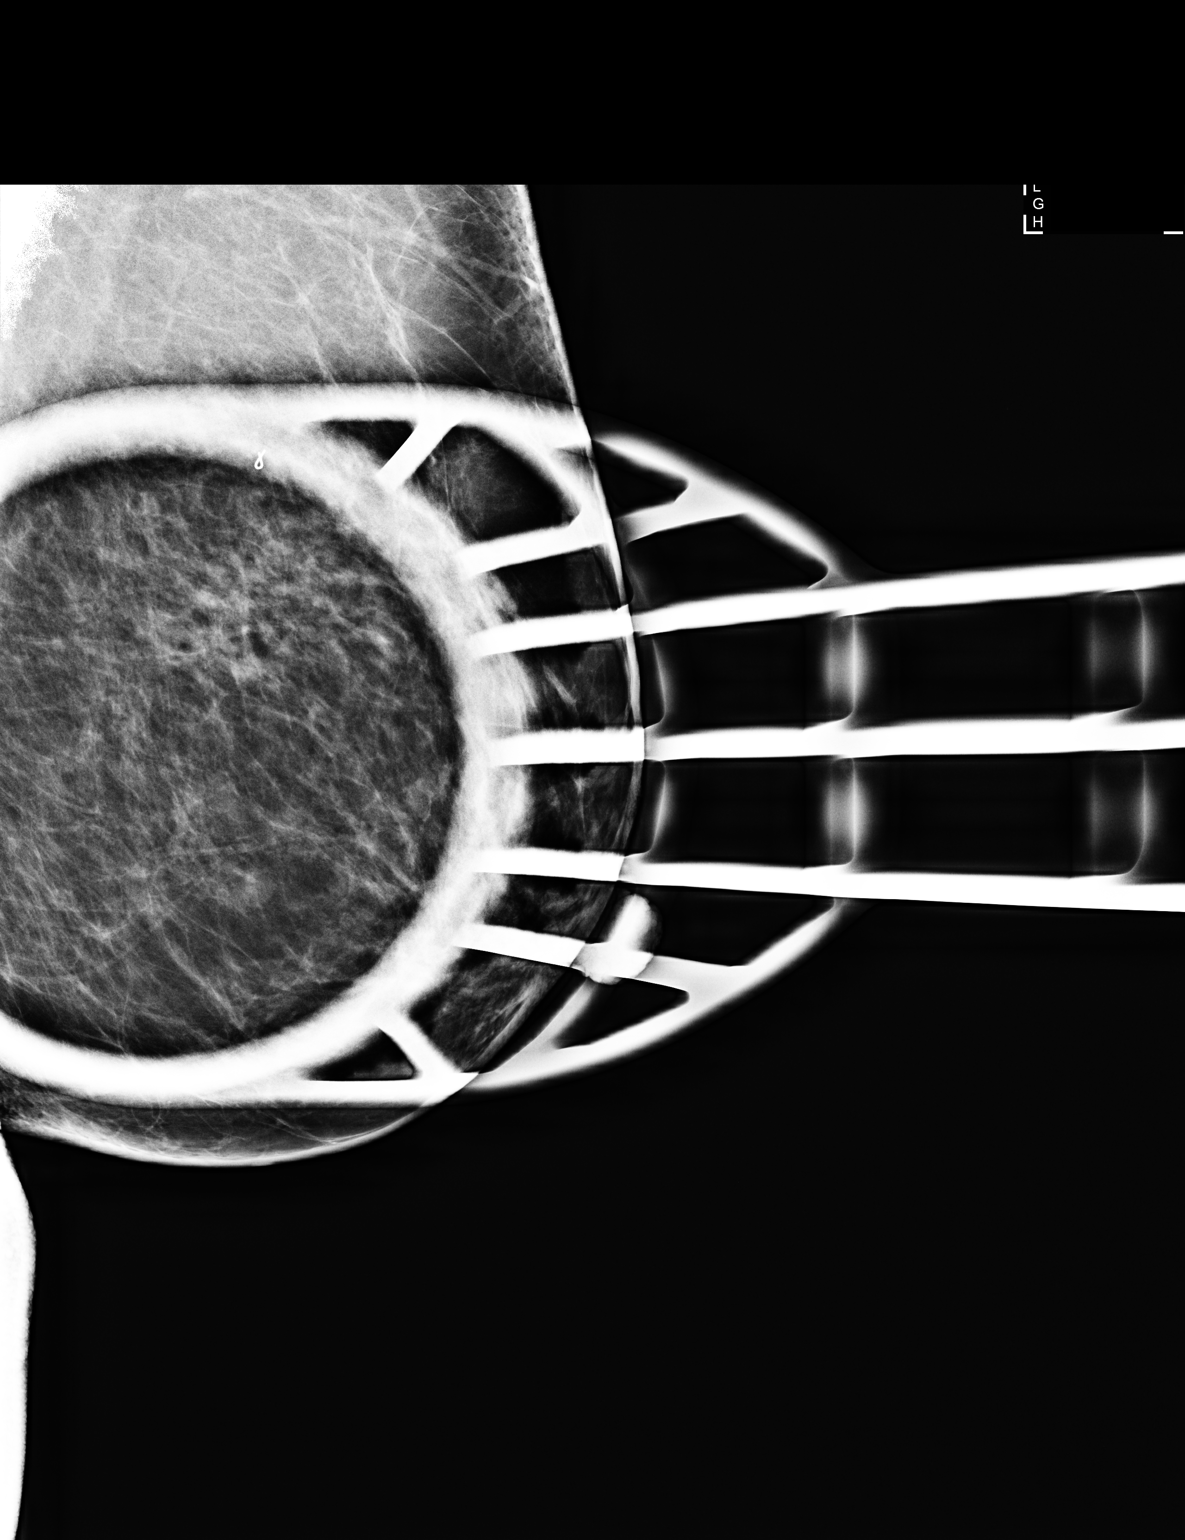

[L CC synth-2D]
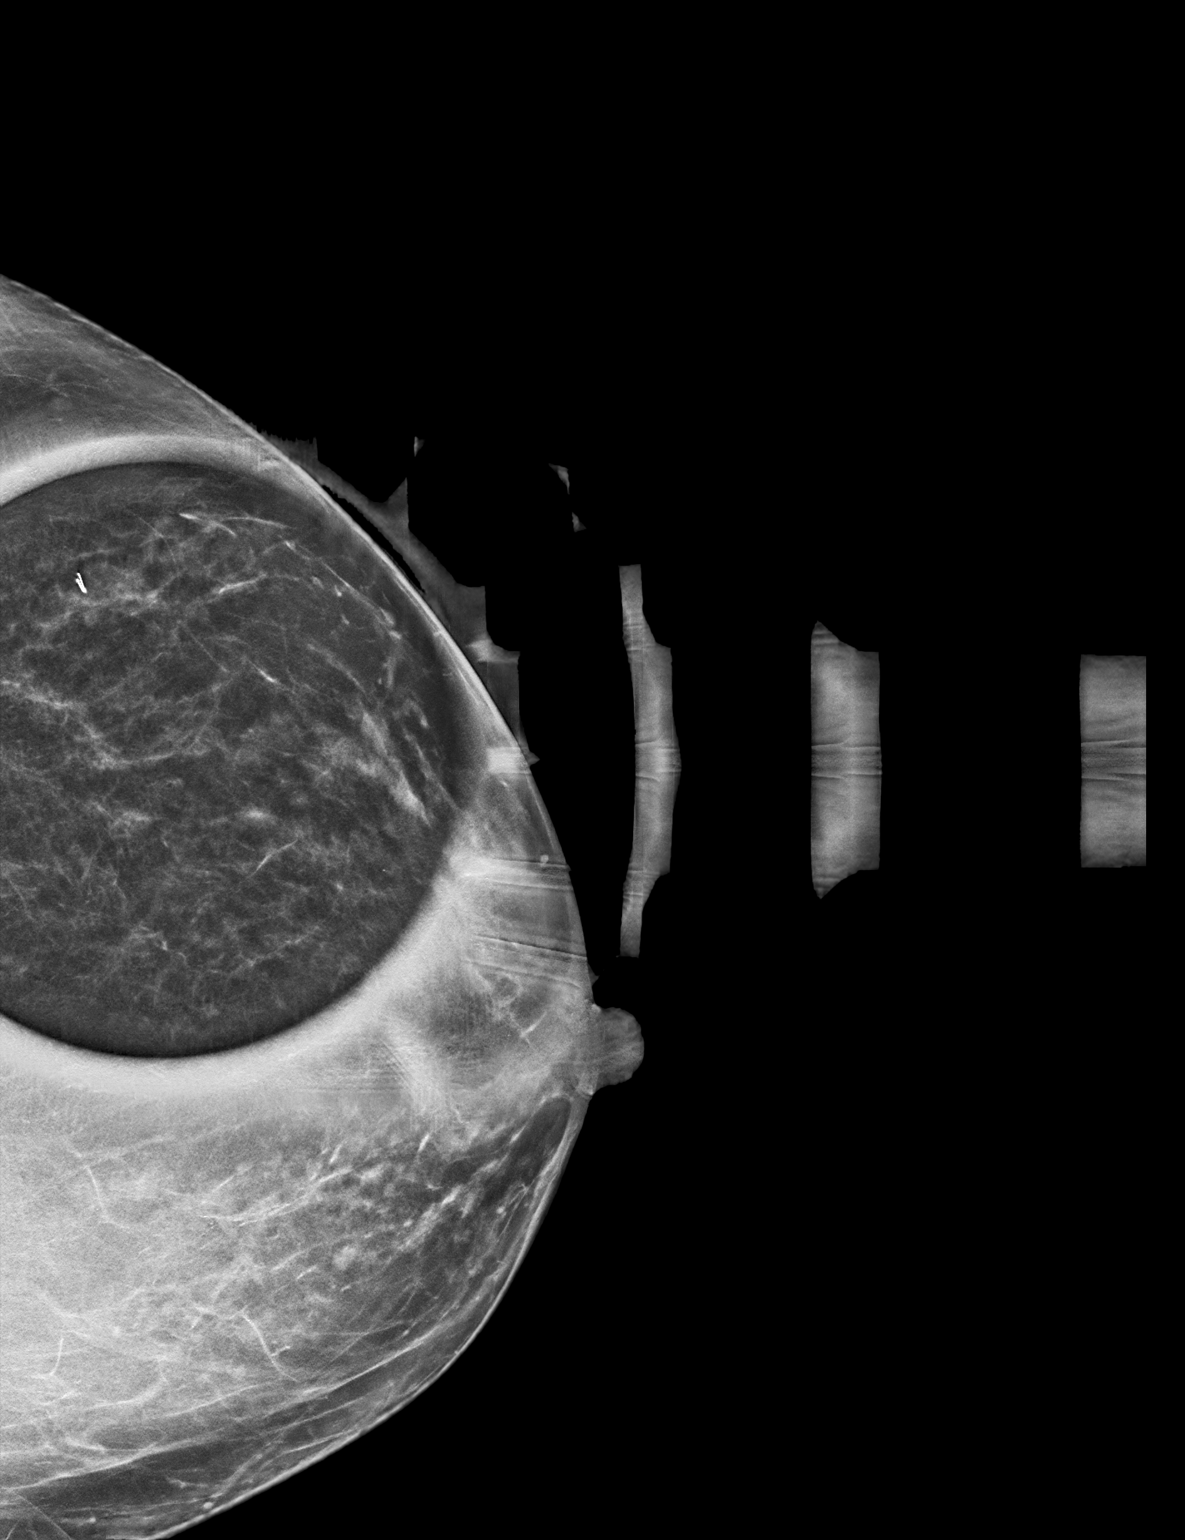

[L MLO tomo · tomo slice 28/55.0]
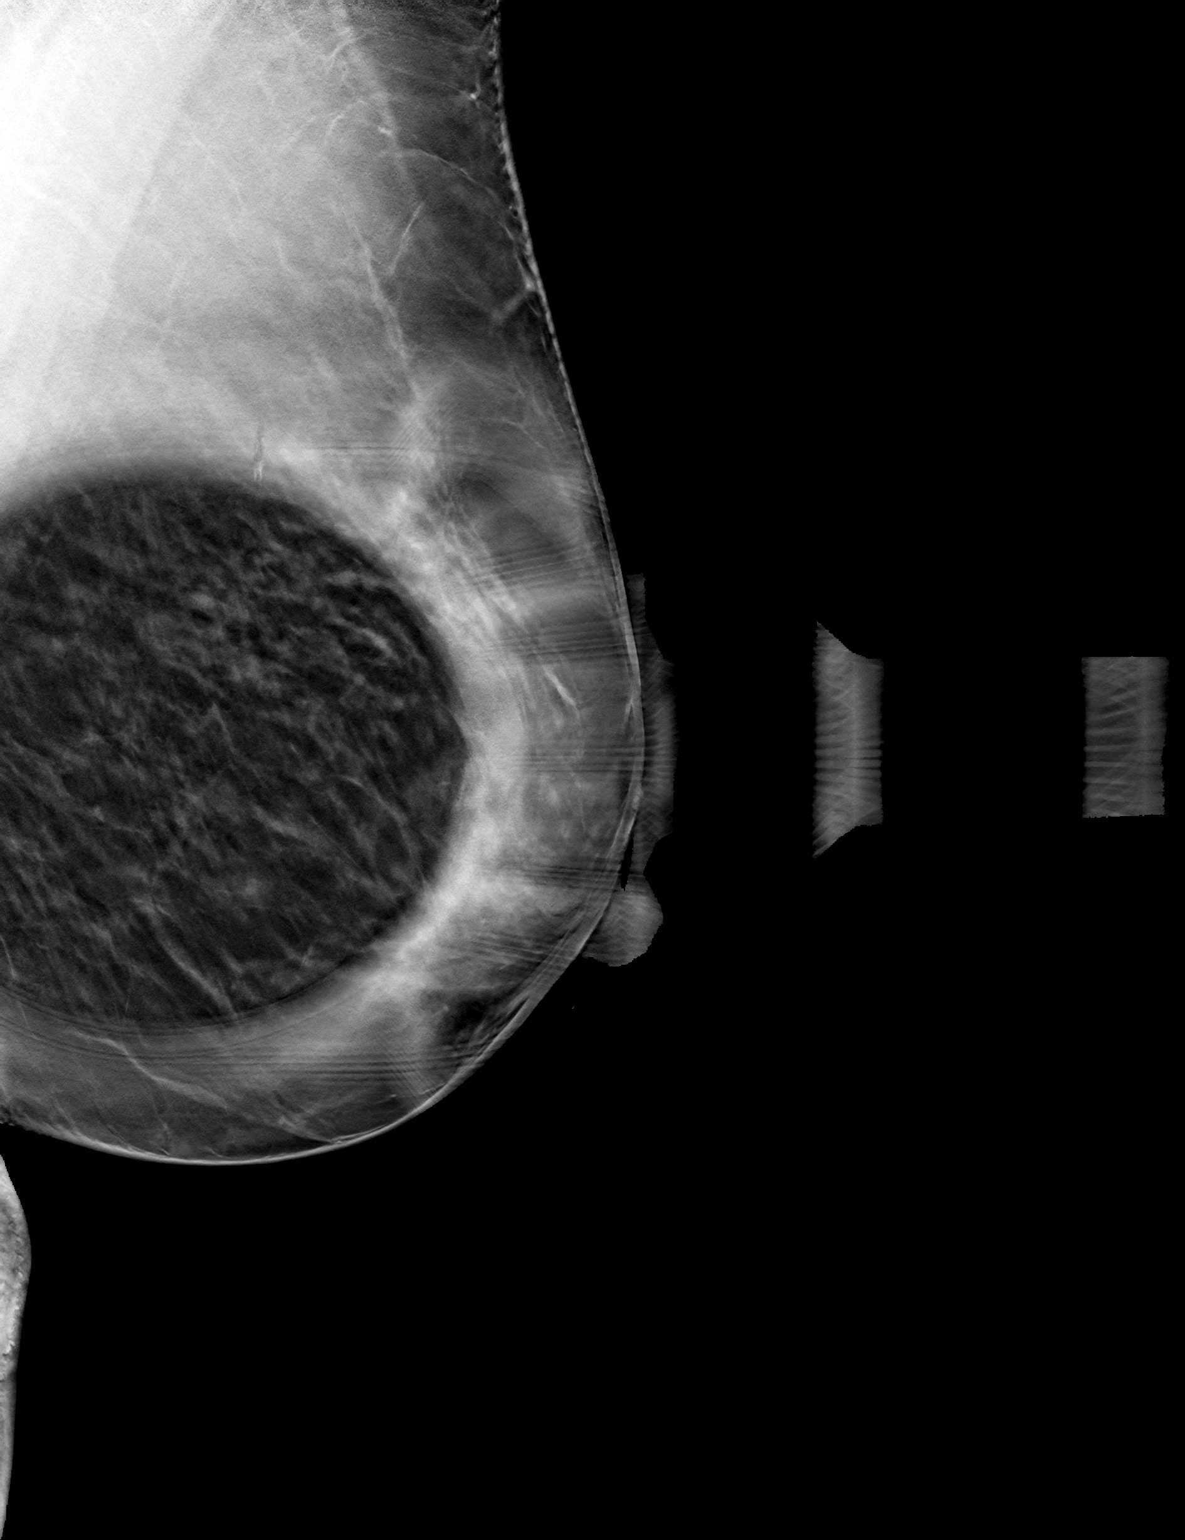

[3 of 7 positions shown; findings below may reference images not displayed]

ACR Breast Density Category b: There are scattered areas of
fibroglandular density.
FINDINGS: Focal spot compression views of the outer left breast show no
evidence of mass or distortion. There is dispersion of
fibroglandular tissue. No persistent asymmetry.

Spot compression views of the medial right breast confirm a
circumscribed lobulated approximately 5 mm mass in the 3 o'clock
region of the right breast.

Mammographic images were processed with CAD.

Targeted ultrasound is performed, showing a a 6 x 4 x 4 mm cluster
of cysts with internal echogenic septations at 3 o'clock position 3
cm from the nipple. There is no associated internal vascular flow.
At 3 o'clock position 2 cm from the nipple is a 4 x 2 x 4 mm cyst
with a single thin internal septation. No associated vascular flow.
IMPRESSION: Probably benign cluster of cysts in the 3 o'clock right breast and
probably benign 4 mm mildly complicated cyst 3 o'clock position 2 cm
from the nipple.

No evidence of malignancy in the left breast.

RECOMMENDATION:
Right breast ultrasound is recommended in 6 months.

I have discussed the findings and recommendations with the patient.
Results were also provided in writing at the conclusion of the
visit. If applicable, a reminder letter will be sent to the patient
regarding the next appointment.

BI-RADS CATEGORY  3: Probably benign.

## 2018-08-04 NOTE — Progress Notes (Signed)
67 y.o. G1P0010 Widowed White or Caucasian Not Hispanic or Latino female here for annual exam.  No vaginal bleeding.  She has a h/o depression, doing better with a change in her medication. Managed by behavioral health NP.  Last year her DEXA returned with osteopenia with an elevated risk of hip and any fracture. She was started on Fosamax.   She just got back from [place] and [place], when with some friends.     Patient's last menstrual period was 08/29/2002 (approximate).          Sexually active: No.  The current method of family planning is post menopausal.    Exercising: Yes.    walking Smoker:  no  Health Maintenance: Pap:  07/18/2017 normal with neg HR HPV, 02/11/14 Neg. HR HPV:neg   History of abnormal Pap:  no MMG: 02/27/2018 Birads 3 probably benign, follow up scheduled for 09/01/2018 Colonoscopy:  11/11/12, due for f/u secondary to [place] colon cancer BMD:   08/14/2017 Osteopenia, T score -2.3. FRAX 21/4% TDaP:  02/11/14   reports that she has never smoked. She has never used smokeless tobacco. She reports that she drinks about 1.0 - 2.0 standard drinks of alcohol per week. She reports that she does not use drugs. She lost her husband of 40 years 8 years ago. She started a support group for widows, also volunteers. She has 3 stepchildren that she is very close with and several grandchildren.   Past Medical History:  Diagnosis Date  . Cancer (HCC)    right leg, upper thigh AND CALF melanoma  . Depression   . Hypertension    Patient denies  . UTI (lower urinary tract infection)     Past Surgical History:  Procedure Laterality Date  . BREAST BIOPSY Left   . COLONOSCOPY    . MELANOMA EXCISION  2006   right thigh and calf  . REFRACTIVE SURGERY    . TONSILLECTOMY    . WISDOM TOOTH EXTRACTION      Current Outpatient Medications  Medication Sig Dispense Refill  . alendronate (FOSAMAX) 70 MG tablet Take 1 tablet (70 mg total) by mouth every 7 (seven) days. Take with a full glass  of water on an empty stomach. 12 tablet 2  . buPROPion (WELLBUTRIN XL) 150 MG 24 hr tablet TAKE 1 TABLET BY MOUTH EVERY DAY 90 tablet 0  . DULoxetine (CYMBALTA) 30 MG capsule TAKE 1 CAPSULE BY MOUTH IN THE A.M. WITH 60 MG CAPSULE TO EQUAL 90MG   0  . DULoxetine (CYMBALTA) 60 MG capsule TAKE 1 CAPSULE BY MOUTH IN THE A.M WITH A 30 MG CAPSULE TO EQUAL 90MG   0  . Multiple Vitamin (MULTIVITAMIN) capsule Take 1 capsule by mouth daily. Juice Plus    . Omega-3 Fatty Acids (FISH OIL) 1000 MG CAPS Take by mouth.    . Probiotic Product (PROBIOTIC DAILY PO) Take by mouth.    . traZODone (DESYREL) 50 MG tablet Take 50 mg by mouth. Half tab at bedtime as needed    . promethazine (PHENERGAN) 12.5 MG tablet Take 1 tablet (12.5 mg total) by mouth every 6 (six) hours as needed for nausea or vomiting. (Patient not taking: Reported on 08/06/2018) 30 tablet 0   No current facility-administered medications for this visit.     Family History  Problem Relation Age of Onset  . Cancer Mother        breast  . Stroke Mother   . Breast cancer Mother   . Alcohol abuse Father   .  Heart attack Father   . Other Sister 36       colon mass, benign  . Hypertension Sister   . Colon cancer Sister   . Prostate cancer Brother   . Other Sister 63       colon mass  . Hypertension Sister     Review of Systems  Constitutional: Negative.   HENT: Positive for congestion and postnasal drip.   Eyes: Negative.   Respiratory: Negative.   Cardiovascular: Negative.   Gastrointestinal: Negative.   Endocrine:       Excessive thirst  Genitourinary: Negative.   Musculoskeletal: Negative.   Skin: Negative.   Allergic/Immunologic: Negative.   Neurological: Negative.   Hematological: Negative.   Psychiatric/Behavioral: Negative.     Exam:   BP 136/82 (BP Location: Right Arm, Patient Position: Sitting, Cuff Size: Normal)   Pulse 72   Ht 5' 5.35" (1.66 m)   Wt 155 lb 12.8 oz (70.7 kg)   LMP 08/29/2002 (Approximate)   BMI  25.65 kg/m   Weight change: @WEIGHTCHANGE @ Height:   Height: 5' 5.35" (166 cm)  Ht Readings from Last 3 Encounters:  08/06/18 5' 5.35" (1.66 m)  07/18/17 5' 5.25" (1.657 m)  07/16/16 5' 5.25" (1.657 m)    General appearance: alert, cooperative and appears stated age Head: Normocephalic, without obvious abnormality, atraumatic Neck: no adenopathy, supple, symmetrical, trachea midline and thyroid normal to inspection and palpation Lungs: clear to auscultation bilaterally Cardiovascular: regular rate and rhythm Breasts: normal appearance, no masses or tenderness Abdomen: soft, non-tender; non distended,  no masses,  no organomegaly Extremities: extremities normal, atraumatic, no cyanosis or edema Skin: Skin color, texture, turgor normal. No rashes or lesions Lymph nodes: Cervical, supraclavicular, and axillary nodes normal. No abnormal inguinal nodes palpated Neurologic: Grossly normal   Pelvic: External genitalia:  no lesions              Urethra:  normal appearing urethra with no masses, tenderness or lesions              Bartholins and Skenes: normal                 Vagina: atrophic appearing vagina with normal color and discharge, no lesions              Cervix: no lesions               Bimanual Exam:  Uterus:  normal size, contour, position, consistency, mobility, non-tender              Adnexa: no mass, fullness, tenderness               Rectovaginal: Confirms               Anus:  normal sphincter tone, no lesions  Chaperone was present for exam.  A:  Well Woman with normal exam  H/o depression, doing much better on medication  Osteopenia with elevated FRAX risk, on Fosamax  H/O vit d def  P:   No pap this year  6 month mammogram next month  Discussed breast self exam  Discussed calcium and vit D intake  Return for fasting labs  Colonoscopy due this year  Continue Fosamax  DEXA next year

## 2018-08-06 ENCOUNTER — Encounter: Payer: Self-pay | Admitting: Obstetrics and Gynecology

## 2018-08-06 ENCOUNTER — Ambulatory Visit (INDEPENDENT_AMBULATORY_CARE_PROVIDER_SITE_OTHER): Payer: PPO | Admitting: Obstetrics and Gynecology

## 2018-08-06 ENCOUNTER — Other Ambulatory Visit: Payer: Self-pay

## 2018-08-06 VITALS — BP 136/82 | HR 72 | Ht 65.35 in | Wt 155.8 lb

## 2018-08-06 DIAGNOSIS — M8580 Other specified disorders of bone density and structure, unspecified site: Secondary | ICD-10-CM

## 2018-08-06 DIAGNOSIS — E559 Vitamin D deficiency, unspecified: Secondary | ICD-10-CM | POA: Diagnosis not present

## 2018-08-06 DIAGNOSIS — Z8659 Personal history of other mental and behavioral disorders: Secondary | ICD-10-CM | POA: Diagnosis not present

## 2018-08-06 DIAGNOSIS — Z0000 Encounter for general adult medical examination without abnormal findings: Secondary | ICD-10-CM | POA: Diagnosis not present

## 2018-08-06 DIAGNOSIS — Z01419 Encounter for gynecological examination (general) (routine) without abnormal findings: Secondary | ICD-10-CM

## 2018-08-06 MED ORDER — ALENDRONATE SODIUM 70 MG PO TABS: 70.0000 mg | ORAL_TABLET | ORAL | 3 refills | Status: DC

## 2018-08-06 NOTE — Patient Instructions (Signed)
EXERCISE AND DIET:  We recommended that you start or continue a regular exercise program for good health. Regular exercise means any activity that makes your heart beat faster and makes you sweat.  We recommend exercising at least 30 minutes per day at least 3 days a week, preferably 4 or 5.  We also recommend a diet low in fat and sugar.  Inactivity, poor dietary choices and obesity can cause diabetes, heart attack, stroke, and kidney damage, among others.    ALCOHOL AND SMOKING:  Women should limit their alcohol intake to no more than 7 drinks/beers/glasses of wine (combined, not each!) per week. Moderation of alcohol intake to this level decreases your risk of breast cancer and liver damage. And of course, no recreational drugs are part of a healthy lifestyle.  And absolutely no smoking or even second hand smoke. Most people know smoking can cause heart and lung diseases, but did you know it also contributes to weakening of your bones? Aging of your skin?  Yellowing of your teeth and nails?  CALCIUM AND VITAMIN D:  Adequate intake of calcium and Vitamin D are recommended.  The recommendations for exact amounts of these supplements seem to change often, but generally speaking 600 mg of calcium (either carbonate or citrate) and 800 units of Vitamin D per day seems prudent. Certain women may benefit from higher intake of Vitamin D.  If you are among these women, your doctor will have told you during your visit.    PAP SMEARS:  Pap smears, to check for cervical cancer or precancers,  have traditionally been done yearly, although recent scientific advances have shown that most women can have pap smears less often.  However, every woman still should have a physical exam from her gynecologist every year. It will include a breast check, inspection of the vulva and vagina to check for abnormal growths or skin changes, a visual exam of the cervix, and then an exam to evaluate the size and shape of the uterus and  ovaries.  And after 69 years of age, a rectal exam is indicated to check for rectal cancers. We will also provide age appropriate advice regarding health maintenance, like when you should have certain vaccines, screening for sexually transmitted diseases, bone density testing, colonoscopy, mammograms, etc.   MAMMOGRAMS:  All women over 40 years old should have a yearly mammogram. Many facilities now offer a "3D" mammogram, which may cost around $50 extra out of pocket. If possible,  we recommend you accept the option to have the 3D mammogram performed.  It both reduces the number of women who will be called back for extra views which then turn out to be normal, and it is better than the routine mammogram at detecting truly abnormal areas.    COLONOSCOPY:  Colonoscopy to screen for colon cancer is recommended for all women at age 50.  We know, you hate the idea of the prep.  We agree, BUT, having colon cancer and not knowing it is worse!!  Colon cancer so often starts as a polyp that can be seen and removed at colonscopy, which can quite literally save your life!  And if your first colonoscopy is normal and you have no family history of colon cancer, most women don't have to have it again for 10 years.  Once every ten years, you can do something that may end up saving your life, right?  We will be happy to help you get it scheduled when you are ready.    Be sure to check your insurance coverage so you understand how much it will cost.  It may be covered as a preventative service at no cost, but you should check your particular policy.      Breast Self-Awareness Breast self-awareness means being familiar with how your breasts look and feel. It involves checking your breasts regularly and reporting any changes to your health care provider. Practicing breast self-awareness is important. A change in your breasts can be a sign of a serious medical problem. Being familiar with how your breasts look and feel allows  you to find any problems early, when treatment is more likely to be successful. All women should practice breast self-awareness, including women who have had breast implants. How to do a breast self-exam One way to learn what is normal for your breasts and whether your breasts are changing is to do a breast self-exam. To do a breast self-exam: Look for Changes  1. Remove all the clothing above your waist. 2. Stand in front of a mirror in a room with good lighting. 3. Put your hands on your hips. 4. Push your hands firmly downward. 5. Compare your breasts in the mirror. Look for differences between them (asymmetry), such as: ? Differences in shape. ? Differences in size. ? Puckers, dips, and bumps in one breast and not the other. 6. Look at each breast for changes in your skin, such as: ? Redness. ? Scaly areas. 7. Look for changes in your nipples, such as: ? Discharge. ? Bleeding. ? Dimpling. ? Redness. ? A change in position. Feel for Changes  Carefully feel your breasts for lumps and changes. It is best to do this while lying on your back on the floor and again while sitting or standing in the shower or tub with soapy water on your skin. Feel each breast in the following way:  Place the arm on the side of the breast you are examining above your head.  Feel your breast with the other hand.  Start in the nipple area and make  inch (2 cm) overlapping circles to feel your breast. Use the pads of your three middle fingers to do this. Apply light pressure, then medium pressure, then firm pressure. The light pressure will allow you to feel the tissue closest to the skin. The medium pressure will allow you to feel the tissue that is a little deeper. The firm pressure will allow you to feel the tissue close to the ribs.  Continue the overlapping circles, moving downward over the breast until you feel your ribs below your breast.  Move one finger-width toward the center of the body.  Continue to use the  inch (2 cm) overlapping circles to feel your breast as you move slowly up toward your collarbone.  Continue the up and down exam using all three pressures until you reach your armpit.  Write Down What You Find  Write down what is normal for each breast and any changes that you find. Keep a written record with breast changes or normal findings for each breast. By writing this information down, you do not need to depend only on memory for size, tenderness, or location. Write down where you are in your menstrual cycle, if you are still menstruating. If you are having trouble noticing differences in your breasts, do not get discouraged. With time you will become more familiar with the variations in your breasts and more comfortable with the exam. How often should I examine my breasts? Examine   your breasts every month. If you are breastfeeding, the best time to examine your breasts is after a feeding or after using a breast pump. If you menstruate, the best time to examine your breasts is 5-7 days after your period is over. During your period, your breasts are lumpier, and it may be more difficult to notice changes. When should I see my health care provider? See your health care provider if you notice:  A change in shape or size of your breasts or nipples.  A change in the skin of your breast or nipples, such as a reddened or scaly area.  Unusual discharge from your nipples.  A lump or thick area that was not there before.  Pain in your breasts.  Anything that concerns you.  This information is not intended to replace advice given to you by your health care provider. Make sure you discuss any questions you have with your health care provider. Document Released: 10/15/2005 Document Revised: 03/22/2016 Document Reviewed: 09/04/2015 Elsevier Interactive Patient Education  2018 [employer]vier Inc[employer]  

## 2018-08-24 ENCOUNTER — Encounter: Payer: Self-pay | Admitting: Emergency Medicine

## 2018-08-24 DIAGNOSIS — F330 Major depressive disorder, recurrent, mild: Secondary | ICD-10-CM

## 2018-08-27 ENCOUNTER — Other Ambulatory Visit: Payer: PPO

## 2018-08-27 DIAGNOSIS — E559 Vitamin D deficiency, unspecified: Secondary | ICD-10-CM

## 2018-08-27 DIAGNOSIS — Z0000 Encounter for general adult medical examination without abnormal findings: Secondary | ICD-10-CM

## 2018-08-28 LAB — COMPREHENSIVE METABOLIC PANEL
ALK PHOS: 69 IU/L (ref 39–117)
ALT: 13 IU/L (ref 0–32)
AST: 22 IU/L (ref 0–40)
Albumin/Globulin Ratio: 2.1 (ref 1.2–2.2)
Albumin: 4.5 g/dL (ref 3.6–4.8)
BILIRUBIN TOTAL: 0.5 mg/dL (ref 0.0–1.2)
BUN/Creatinine Ratio: 13 (ref 12–28)
BUN: 14 mg/dL (ref 8–27)
CHLORIDE: 101 mmol/L (ref 96–106)
CO2: 24 mmol/L (ref 20–29)
Calcium: 8.8 mg/dL (ref 8.7–10.3)
Creatinine, Ser: 1.08 mg/dL — ABNORMAL HIGH (ref 0.57–1.00)
GFR calc Af Amer: 61 mL/min/{1.73_m2} (ref >59)
GFR calc non Af Amer: 53 mL/min/{1.73_m2} — ABNORMAL LOW (ref >59)
GLUCOSE: 110 mg/dL — AB (ref 65–99)
Globulin, Total: 2.1 g/dL (ref 1.5–4.5)
Potassium: 4.2 mmol/L (ref 3.5–5.2)
Sodium: 141 mmol/L (ref 134–144)
Total Protein: 6.6 g/dL (ref 6.0–8.5)

## 2018-08-28 LAB — CBC
HEMATOCRIT: 41.4 % (ref 34.0–46.6)
HEMOGLOBIN: 13.5 g/dL (ref 11.1–15.9)
MCH: 27.7 pg (ref 26.6–33.0)
MCHC: 32.6 g/dL (ref 31.5–35.7)
MCV: 85 fL (ref 79–97)
Platelets: 318 10*3/uL (ref 150–450)
RBC: 4.88 x10E6/uL (ref 3.77–5.28)
RDW: 13.2 % (ref 12.3–15.4)
WBC: 7.1 10*3/uL (ref 3.4–10.8)

## 2018-08-28 LAB — LIPID PANEL
Chol/HDL Ratio: 2.9 ratio (ref 0.0–4.4)
Cholesterol, Total: 234 mg/dL — ABNORMAL HIGH (ref 100–199)
HDL: 80 mg/dL (ref >39)
LDL Calculated: 142 mg/dL — ABNORMAL HIGH (ref 0–99)
Triglycerides: 61 mg/dL (ref 0–149)
VLDL CHOLESTEROL CAL: 12 mg/dL (ref 5–40)

## 2018-08-28 LAB — VITAMIN D 25 HYDROXY (VIT D DEFICIENCY, FRACTURES): Vit D, 25-Hydroxy: 31.8 ng/mL (ref 30.0–100.0)

## 2018-08-30 LAB — HGB A1C W/O EAG: Hgb A1c MFr Bld: 5.7 % — ABNORMAL HIGH (ref 4.8–5.6)

## 2018-08-30 LAB — SPECIMEN STATUS REPORT

## 2018-09-01 ENCOUNTER — Ambulatory Visit
Admission: RE | Admit: 2018-09-01 | Discharge: 2018-09-01 | Disposition: A | Discharge status: Home / Self Care | Payer: PPO | Source: Ambulatory Visit | Attending: Obstetrics and Gynecology | Admitting: Obstetrics and Gynecology

## 2018-09-01 DIAGNOSIS — N6314 Unspecified lump in the right breast, lower inner quadrant: Secondary | ICD-10-CM | POA: Diagnosis not present

## 2018-09-01 DIAGNOSIS — N6312 Unspecified lump in the right breast, upper inner quadrant: Secondary | ICD-10-CM | POA: Diagnosis not present

## 2018-09-01 DIAGNOSIS — N6310 Unspecified lump in the right breast, unspecified quadrant: Secondary | ICD-10-CM

## 2018-09-01 DIAGNOSIS — R928 Other abnormal and inconclusive findings on diagnostic imaging of breast: Secondary | ICD-10-CM | POA: Diagnosis not present

## 2018-09-03 ENCOUNTER — Other Ambulatory Visit: Payer: Self-pay | Admitting: *Deleted

## 2018-09-03 DIAGNOSIS — R7303 Prediabetes: Secondary | ICD-10-CM

## 2018-09-03 DIAGNOSIS — R899 Unspecified abnormal finding in specimens from other organs, systems and tissues: Secondary | ICD-10-CM

## 2018-09-04 ENCOUNTER — Encounter: Payer: Self-pay | Admitting: Psychiatry

## 2018-09-04 ENCOUNTER — Encounter

## 2018-09-04 ENCOUNTER — Ambulatory Visit: Payer: PPO | Admitting: Psychiatry

## 2018-09-04 VITALS — BP 132/83 | HR 71

## 2018-09-04 DIAGNOSIS — F3342 Major depressive disorder, recurrent, in full remission: Secondary | ICD-10-CM

## 2018-09-04 DIAGNOSIS — F5101 Primary insomnia: Secondary | ICD-10-CM

## 2018-09-04 MED ORDER — BUPROPION HCL ER (XL) 150 MG PO TB24: 150.0000 mg | ORAL_TABLET | Freq: Every day | ORAL | 1 refills | Status: DC

## 2018-09-04 MED ORDER — DULOXETINE HCL 60 MG PO CPEP: ORAL_CAPSULE | ORAL | 1 refills | Status: DC

## 2018-09-04 MED ORDER — DULOXETINE HCL 30 MG PO CPEP: ORAL_CAPSULE | ORAL | 1 refills | Status: DC

## 2018-09-04 MED ORDER — TRAZODONE HCL 50 MG PO TABS: 50.0000 mg | ORAL_TABLET | Freq: Every evening | ORAL | 1 refills | Status: DC | PRN

## 2018-09-04 MED ORDER — ZOLPIDEM TARTRATE 10 MG PO TABS: 10.0000 mg | ORAL_TABLET | Freq: Every evening | ORAL | 5 refills | Status: DC | PRN

## 2018-09-04 NOTE — Progress Notes (Signed)
Lori Harvey [id number] 1951-10-14 67 y.o.  Subjective:   Patient ID:  Lori Harvey is a 67 y.o. (DOB [birth year]) female.  Chief Complaint:  Chief Complaint  Patient presents with  . Insomnia  . Follow-up    Depression, anxiety    HPI Lori Harvey presents to the office today for follow-up of depression and anxiety. She reports that she is currently feeling better than she has in 8 years. Reports that she has resumed activities that she has not done in years, such as traveling. Reports that she is enjoying and looking forward to things again. Denies current depressed mood other than occasional sad mood when experience grief. Denies anxiety- "I deal with things better." Reports that energy and motivation have improved. She reports that energy is more steady. Appetite is stable and reports she is intentionally trying to lose weight. She reports that concentration is improved but continues to get occasionally distracted. Denies SI.   She reports that her sleep remains poor. Reports that Trazodone is only partially effective. She reports that she was awake almost all night the night before last. She reports difficulty falling asleep despite multiple sleep hygiene strategies to include low lights, sleep mask, no caffeine use after the morning, exercising early in the day, etc. Unable to stay asleep unless she has taken Trazodone. Typically able to return to sleep when she has taken Trazodone. Estimates sleeping 6 hours with Trazodone. Reports multiple awakenings without Trazodone. She reports that she has been told that she snores lightly but does not gasp for air.    Review of Systems:  Review of Systems  Endocrine:       Was told labs indicated possible pre-diabetes  Musculoskeletal: Negative for gait problem.  Neurological: Negative for tremors.  Psychiatric/Behavioral:       Please refer to HPI    Medications: I have reviewed the patient's current medications.  Current Outpatient  Medications  Medication Sig Dispense Refill  . alendronate (FOSAMAX) 70 MG tablet Take 1 tablet (70 mg total) by mouth every 7 (seven) days. Take with a full glass of water on an empty stomach. 12 tablet 3  . Multiple Vitamin (MULTIVITAMIN) capsule Take 1 capsule by mouth daily. Juice Plus    . Omega-3 Fatty Acids (FISH OIL) 1000 MG CAPS Take by mouth.    . Probiotic Product (PROBIOTIC DAILY PO) Take by mouth.    . traZODone (DESYREL) 50 MG tablet Take 1 tablet (50 mg total) by mouth at bedtime as needed for sleep. Half tab at bedtime as needed 90 tablet 1  . buPROPion (WELLBUTRIN XL) 150 MG 24 hr tablet Take 1 tablet (150 mg total) by mouth daily. 90 tablet 1  . DULoxetine (CYMBALTA) 30 MG capsule Take with 60 mg capsule to equal total daily dose of 90 mg 90 capsule 1  . DULoxetine (CYMBALTA) 60 MG capsule Take one capsule with 30 mg capsule to equal total dose of 90 mg daily 90 capsule 1  . promethazine (PHENERGAN) 12.5 MG tablet Take 1 tablet (12.5 mg total) by mouth every 6 (six) hours as needed for nausea or vomiting. (Patient not taking: Reported on 08/06/2018) 30 tablet 0  . zolpidem (AMBIEN) 10 MG tablet Take 1 tablet (10 mg total) by mouth at bedtime as needed for sleep. 1/2 - 1 po qhs prn 15 tablet 5   No current facility-administered medications for this visit.     Medication Side Effects: Other: Dry mouth, mild tremor.  Allergies:  Allergies  Allergen  Reactions  . Keflex [Cephalexin]     Nausea, hives  . Sulfa Antibiotics Rash    Past Medical History:  Diagnosis Date  . Cancer (HCC)    right leg, upper thigh AND CALF melanoma  . Depression   . Hypertension    Patient denies  . Melanoma ([place])   . Osteopenia   . UTI (lower urinary tract infection)     Family History  Problem Relation Age of Onset  . Cancer Mother        breast  . Stroke Mother   . Breast cancer Mother   . Depression Mother   . Alcohol abuse Father   . Heart attack Father   . Other Sister 42        colon mass, benign  . Hypertension Sister   . Colon cancer Sister   . Depression Sister   . Prostate cancer Brother   . Other Sister 14       colon mass  . Hypertension Sister   . Anxiety disorder Maternal Aunt     Social History   Socioeconomic History  . Marital status: Widowed    Spouse name: Not on file  . Number of children: Not on file  . Years of education: Not on file  . Highest education level: Not on file  Occupational History  . Not on file  Social Needs  . Financial resource strain: Not on file  . Food insecurity:    Worry: Not on file    Inability: Not on file  . Transportation needs:    Medical: Not on file    Non-medical: Not on file  Tobacco Use  . Smoking status: Never Smoker  . Smokeless tobacco: Never Used  Substance and Sexual Activity  . Alcohol use: Yes    Alcohol/week: 1.0 - 2.0 standard drinks    Types: 1 - 2 Glasses of wine per week  . Drug use: No  . Sexual activity: Not Currently    Birth control/protection: Post-menopausal  Lifestyle  . Physical activity:    Days per week: Not on file    Minutes per session: Not on file  . Stress: Not on file  Relationships  . Social connections:    Talks on phone: Not on file    Gets together: Not on file    Attends religious service: Not on file    Active member of club or organization: Not on file    Attends meetings of clubs or organizations: Not on file    Relationship status: Not on file  . Intimate partner violence:    Fear of current or ex partner: Not on file    Emotionally abused: Not on file    Physically abused: Not on file    Forced sexual activity: Not on file  Other Topics Concern  . Not on file  Social History Narrative  . Not on file    Past Medical History, Surgical history, Social history, and Family history were reviewed and updated as appropriate.   Please see review of systems for further details on the patient's review from today.   Objective:   Physical Exam:   BP 132/83   Pulse 71   LMP 08/29/2002 (Approximate)   Physical Exam  Constitutional: She is oriented to person, place, and time. She appears well-developed. No distress.  Musculoskeletal: She exhibits no deformity.  Neurological: She is alert and oriented to person, place, and time. Coordination normal.  Psychiatric: She has a normal mood  and affect. Her speech is normal and behavior is normal. Judgment and thought content normal. Her mood appears not anxious. Her affect is not angry, not blunt, not labile and not inappropriate. Cognition and memory are normal. She does not exhibit a depressed mood. She expresses no homicidal and no suicidal ideation. She expresses no suicidal plans and no homicidal plans.  Insight intact. No auditory or visual hallucinations. No delusions.     Lab Review:     Component Value Date/Time   NA 141 08/27/2018 0832   K 4.2 08/27/2018 0832   CL 101 08/27/2018 0832   CO2 24 08/27/2018 0832   GLUCOSE 110 (H) 08/27/2018 0832   GLUCOSE 94 07/16/2016 1455   BUN 14 08/27/2018 0832   CREATININE 1.08 (H) 08/27/2018 0832   CREATININE 0.87 07/16/2016 1455   CALCIUM 8.8 08/27/2018 0832   PROT 6.6 08/27/2018 0832   ALBUMIN 4.5 08/27/2018 0832   AST 22 08/27/2018 0832   ALT 13 08/27/2018 0832   ALKPHOS 69 08/27/2018 0832   BILITOT 0.5 08/27/2018 0832   GFRNONAA 53 (L) 08/27/2018 0832   GFRAA 61 08/27/2018 0832       Component Value Date/Time   WBC 7.1 08/27/2018 0832   WBC 7.7 07/16/2016 1455   RBC 4.88 08/27/2018 0832   RBC 4.63 07/16/2016 1455   HGB 13.5 08/27/2018 0832   HGB 13.7 02/11/2014 1304   HCT 41.4 08/27/2018 0832   PLT 318 08/27/2018 0832   MCV 85 08/27/2018 0832   MCH 27.7 08/27/2018 0832   MCH 28.1 07/16/2016 1455   MCHC 32.6 08/27/2018 0832   MCHC 33.2 07/16/2016 1455   RDW 13.2 08/27/2018 0832   LYMPHSABS 2,695 07/16/2016 1455   MONOABS 539 07/16/2016 1455   EOSABS 77 07/16/2016 1455   BASOSABS 0 07/16/2016 1455    No results  found for: POCLITH, LITHIUM   No results found for: PHENYTOIN, PHENOBARB, VALPROATE, CBMZ   .res Assessment: Plan:   Patient seen for 30 minutes and greater than 50% of visit spent counseling patient regarding treatment options to improve insomnia.  Discussed restarting Ambien as needed insomnia since patient reports that this has been effective for her insomnia in the past without any side effects.  Recommended taking Ambien 5 mg at bedtime for 3-5 nights to help reset sleep-wake cycle and then take only as needed.  Also discussed patient's complaint of dry mouth and discussed this may be an effect of several medications.  Once again discussed possibility of discontinuing Wellbutrin XL, however patient reports that she will be leaving for a 1 month trip and prefers not to discontinue at that time in case of worsening depressive signs and symptoms.  Also recommended not attempting discontinuation immediately after returning from trip with family, since patient reported worsening depressive signs and symptoms in the past after traveling and then returning home and being alone.  Recommended considering discontinuation of Wellbutrin XL in the spring or summer.  Will use Cymbalta 90 mg daily for mood and anxiety. Major depressive disorder, recurrent episode, in full remission ([place]) - Plan: buPROPion (WELLBUTRIN XL) 150 MG 24 hr tablet, DULoxetine (CYMBALTA) 60 MG capsule, DULoxetine (CYMBALTA) 30 MG capsule  Primary insomnia - Plan: zolpidem (AMBIEN) 10 MG tablet, traZODone (DESYREL) 50 MG tablet  Please see After Visit Summary for patient specific instructions.  Future Appointments  Date Time Provider [place]  10/01/2018  8:40 AM [place] [facility] [place] None  03/03/2019  9:00 AM Thayer Headings, PMHNP [facility] None  08/12/2019  1:30 PM Salvadore Dom, MD [place] None    No orders of the defined types were placed in this encounter.     -------------------------------

## 2018-10-01 ENCOUNTER — Other Ambulatory Visit (INDEPENDENT_AMBULATORY_CARE_PROVIDER_SITE_OTHER): Payer: PPO

## 2018-10-01 DIAGNOSIS — R899 Unspecified abnormal finding in specimens from other organs, systems and tissues: Secondary | ICD-10-CM | POA: Diagnosis not present

## 2018-10-03 LAB — RENAL FUNCTION PANEL
Albumin: 4.1 g/dL (ref 3.6–4.8)
BUN / CREAT RATIO: 19 (ref 12–28)
BUN: 20 mg/dL (ref 8–27)
CHLORIDE: 102 mmol/L (ref 96–106)
CO2: 25 mmol/L (ref 20–29)
Calcium: 9.3 mg/dL (ref 8.7–10.3)
Creatinine, Ser: 1.03 mg/dL — ABNORMAL HIGH (ref 0.57–1.00)
GFR calc non Af Amer: 56 mL/min/{1.73_m2} — ABNORMAL LOW (ref >59)
GFR, EST AFRICAN AMERICAN: 64 mL/min/{1.73_m2} (ref >59)
GLUCOSE: 108 mg/dL — AB (ref 65–99)
Phosphorus: 3.5 mg/dL (ref 2.5–4.5)
Potassium: 4.1 mmol/L (ref 3.5–5.2)
SODIUM: 140 mmol/L (ref 134–144)

## 2018-10-29 HISTORY — PX: COLONOSCOPY: SHX174

## 2018-12-17 ENCOUNTER — Telehealth: Payer: Self-pay | Admitting: Psychiatry

## 2018-12-17 NOTE — Telephone Encounter (Signed)
Patient stated you discussed a sleep study for her. Please call so we can discuss how to proceed forward.

## 2018-12-23 ENCOUNTER — Telehealth: Payer: Self-pay | Admitting: Psychiatry

## 2018-12-23 ENCOUNTER — Telehealth: Payer: Self-pay | Admitting: Obstetrics and Gynecology

## 2018-12-23 NOTE — Telephone Encounter (Signed)
Patient left a message on the answering machine stating she is returning a call to [facility] from December 2019. She has been out of town.

## 2018-12-23 NOTE — Telephone Encounter (Signed)
Spoke with patient, advised as seen below per Dr. Talbert Nan. Patient states she was traveling. Copy of labs forwarded to PCP. Patient verbalizes understanding and is agreeable.   Notes recorded by Salvadore Dom, MD on 10/03/2018 at 1:35 PM EST Please let the patient know that her renal function is still mildly abnormal and have her f/u with her primary MD CC: Dr Elease Hashimoto   Routing to provider for final review. Patient is agreeable to disposition. Will close encounter.  Cc: K. Sprague, RN

## 2018-12-23 NOTE — Telephone Encounter (Signed)
Patient left vm wants to know how to proceed with the sleep study that was discussed please call and advise [phone]

## 2018-12-25 ENCOUNTER — Encounter: Payer: Self-pay | Admitting: Internal Medicine

## 2018-12-26 ENCOUNTER — Encounter: Payer: Self-pay | Admitting: Family Medicine

## 2018-12-26 ENCOUNTER — Other Ambulatory Visit: Payer: Self-pay

## 2018-12-26 ENCOUNTER — Ambulatory Visit (INDEPENDENT_AMBULATORY_CARE_PROVIDER_SITE_OTHER): Payer: Medicare Other | Admitting: Family Medicine

## 2018-12-26 VITALS — BP 134/78 | HR 69 | Temp 98.1°F | Ht 65.35 in | Wt 153.8 lb

## 2018-12-26 DIAGNOSIS — R0683 Snoring: Secondary | ICD-10-CM

## 2018-12-26 DIAGNOSIS — R7303 Prediabetes: Secondary | ICD-10-CM

## 2018-12-26 DIAGNOSIS — N289 Disorder of kidney and ureter, unspecified: Secondary | ICD-10-CM

## 2018-12-26 NOTE — Patient Instructions (Addendum)
Make sure you are well hydrated when you return for your labs in March.  I will go ahead and set up sleep study.

## 2018-12-26 NOTE — Progress Notes (Signed)
Subjective:     Patient ID: Lori Harvey, female   DOB: 1951/03/31, 68 y.o.   MRN: [id number]  HPI Patient seen for the following issues  She sees gynecologist.  She had recent creatinine of 1.08 with BUN of 14 and GFR 53.  She had follow-up lab 2 months ago which showed creatinine 1.02 with BUN of 20 and GFR 56.  She was told to follow-up here for further evaluation.  No recent nonsteroidal use.  No recent new medications.  She takes Wellbutrin and Cymbalta per psychiatry.  Also takes trazodone at night.  She has had some dry mouth symptoms.  No peripheral edema.  No decreased urine output.  Usually drinks coffee first thing in the morning and states that both those labs were taken early morning and possibly some mild dehydration.  Second issue is concern for possible sleep apnea.  She states that she has been told that she snores when she went on a trip with some girlfriends in the past.  Possible apnea episodes at night.  She does have some daytime fatigue and occasional daytime somnolence.  She states she generally does not feel well rested when she gets up in the mornings.  No history of sleep study.  Past Medical History:  Diagnosis Date  . Cancer (HCC)    right leg, upper thigh AND CALF melanoma  . Depression   . Hypertension    Patient denies  . Melanoma ([place])   . Osteopenia   . UTI (lower urinary tract infection)    Past Surgical History:  Procedure Laterality Date  . BREAST BIOPSY Left   . COLONOSCOPY    . MELANOMA EXCISION  2006   right thigh and calf  . REFRACTIVE SURGERY    . TONSILLECTOMY    . WISDOM TOOTH EXTRACTION      reports that she has never smoked. She has never used smokeless tobacco. She reports current alcohol use of about 1.0 - 2.0 standard drinks of alcohol per week. She reports that she does not use drugs. family history includes Alcohol abuse in her father; Anxiety disorder in her maternal aunt; Breast cancer in her mother; Cancer in her mother; Colon  cancer in her sister; Depression in her mother and sister; Heart attack in her father; Hypertension in her sister and sister; Other (age of onset: 82) in her sister; Other (age of onset: 19) in her sister; Prostate cancer in her brother; Stroke in her mother. Allergies  Allergen Reactions  . Keflex [Cephalexin]     Nausea, hives  . Sulfa Antibiotics Rash     Review of Systems  Constitutional: Positive for fatigue. Negative for appetite change and unexpected weight change.  Respiratory: Negative for shortness of breath.   Cardiovascular: Negative for chest pain.  Gastrointestinal: Negative for abdominal pain.  Neurological: Negative for dizziness.  Hematological: Negative for adenopathy.       Objective:   Physical Exam Constitutional:      Appearance: Normal appearance.  Neck:     Musculoskeletal: Neck supple.  Cardiovascular:     Rate and Rhythm: Normal rate and regular rhythm.  Pulmonary:     Effort: Pulmonary effort is normal.     Breath sounds: Normal breath sounds.  Musculoskeletal:     Right lower leg: No edema.     Left lower leg: No edema.  Neurological:     Mental Status: She is alert.        Assessment:     #1 mild renal  insufficiency.  Recent GFR 56.  Probably has some mild prerenal component since these labs were checked early in the morning without adequate hydration  #2 concern for possible obstructive sleep apnea.  Epworth sleep scale score of 9  #3 history of prediabetes    Plan:     -Recommend a follow-up renal profile and Future lab ordered.  She will come back when she is well-hydrated -Continue to avoid non-steroidals -Set up sleep study to further evaluate  Eulas Post MD [facility] at [facility]

## 2018-12-30 ENCOUNTER — Encounter: Payer: Self-pay | Admitting: Internal Medicine

## 2018-12-30 ENCOUNTER — Ambulatory Visit (AMBULATORY_SURGERY_CENTER): Payer: Self-pay | Admitting: *Deleted

## 2018-12-30 ENCOUNTER — Other Ambulatory Visit: Payer: Self-pay

## 2018-12-30 VITALS — Ht 66.5 in | Wt 150.0 lb

## 2018-12-30 DIAGNOSIS — Z83719 Family history of colon polyps, unspecified: Secondary | ICD-10-CM

## 2018-12-30 DIAGNOSIS — Z8371 Family history of colonic polyps: Secondary | ICD-10-CM

## 2018-12-30 DIAGNOSIS — Z800 Family history of malignant neoplasm of digestive organs: Secondary | ICD-10-CM

## 2018-12-30 MED ORDER — PEG-KCL-NACL-NASULF-NA ASC-C 140 G PO SOLR: 1.0000 | ORAL | 0 refills | Status: DC

## 2018-12-30 NOTE — Progress Notes (Signed)
No egg or soy allergy known to patient  No issues with past sedation with any surgeries  or procedures, no intubation problems  No diet pills per patient No home 02 use per patient  No blood thinners per patient  Pt denies issues with constipation  No A fib or A flutter  EMMI video sent to pt's e mail  -  Pt has HTA will give plenvu sample- Lot [id number]  Exp 04/2019 as directed

## 2019-01-08 ENCOUNTER — Other Ambulatory Visit: Payer: Self-pay

## 2019-01-08 ENCOUNTER — Other Ambulatory Visit (INDEPENDENT_AMBULATORY_CARE_PROVIDER_SITE_OTHER): Payer: Medicare Other

## 2019-01-08 DIAGNOSIS — N289 Disorder of kidney and ureter, unspecified: Secondary | ICD-10-CM | POA: Diagnosis not present

## 2019-01-08 LAB — BASIC METABOLIC PANEL
BUN: 17 mg/dL (ref 6–23)
CO2: 28 mEq/L (ref 19–32)
Calcium: 9.2 mg/dL (ref 8.4–10.5)
Chloride: 98 mEq/L (ref 96–112)
Creatinine, Ser: 0.94 mg/dL (ref 0.40–1.20)
GFR: 58.99 mL/min — ABNORMAL LOW (ref >60.00)
Glucose, Bld: 104 mg/dL — ABNORMAL HIGH (ref 70–99)
Potassium: 4.1 mEq/L (ref 3.5–5.1)
Sodium: 134 mEq/L — ABNORMAL LOW (ref 135–145)

## 2019-01-12 ENCOUNTER — Encounter: Payer: Self-pay | Admitting: Internal Medicine

## 2019-01-12 ENCOUNTER — Other Ambulatory Visit: Payer: Self-pay

## 2019-01-12 ENCOUNTER — Ambulatory Visit (AMBULATORY_SURGERY_CENTER): Payer: Medicare Other | Admitting: Internal Medicine

## 2019-01-12 VITALS — BP 136/68 | HR 15 | Temp 98.6°F | Resp 17 | Ht 66.0 in | Wt 150.0 lb

## 2019-01-12 DIAGNOSIS — Z800 Family history of malignant neoplasm of digestive organs: Secondary | ICD-10-CM

## 2019-01-12 DIAGNOSIS — D125 Benign neoplasm of sigmoid colon: Secondary | ICD-10-CM

## 2019-01-12 DIAGNOSIS — K635 Polyp of colon: Secondary | ICD-10-CM

## 2019-01-12 DIAGNOSIS — Z1211 Encounter for screening for malignant neoplasm of colon: Secondary | ICD-10-CM

## 2019-01-12 MED ORDER — SODIUM CHLORIDE 0.9 % IV SOLN: 500.0000 mL | Freq: Once | INTRAVENOUS | Status: DC

## 2019-01-12 NOTE — Progress Notes (Signed)
Called to room to assist during endoscopic procedure.  Patient ID and intended procedure confirmed with present staff. Received instructions for my participation in the procedure from the performing physician.  

## 2019-01-12 NOTE — Progress Notes (Signed)
To PACU, VSS. Report to [email or web address] 

## 2019-01-12 NOTE — Op Note (Signed)
[facility] Patient Name: Lori Harvey Procedure Date: 01/12/2019 10:17 AM MRN: [id number] Endoscopist: Jerene Bears , MD Age: 68 Referring MD:  Date of Birth: November 19, 1950 Gender: Female Account #: [id number] Procedure:                Colonoscopy Indications:              Screening patient at increased risk: Family history                            of colorectal cancer in multiple 1st-degree                            relatives Medicines:                Monitored Anesthesia Care Procedure:                Pre-Anesthesia Assessment:                           - Prior to the procedure, a History and Physical                            was performed, and patient medications and                            allergies were reviewed. The patient's tolerance of                            previous anesthesia was also reviewed. The risks                            and benefits of the procedure and the sedation                            options and risks were discussed with the patient.                            All questions were answered, and informed consent                            was obtained. Prior Anticoagulants: The patient has                            taken no previous anticoagulant or antiplatelet                            agents. ASA Grade Assessment: II - A patient with                            mild systemic disease. After reviewing the risks                            and benefits, the patient was deemed in  satisfactory condition to undergo the procedure.                           After obtaining informed consent, the colonoscope                            was passed under direct vision. Throughout the                            procedure, the patient's blood pressure, pulse, and                            oxygen saturations were monitored continuously. The                            Colonoscope was introduced through the anus and                        advanced to the cecum, identified by appendiceal                            orifice and ileocecal valve. The colonoscopy was                            performed without difficulty. The patient tolerated                            the procedure well. The quality of the bowel                            preparation was good. The ileocecal valve,                            appendiceal orifice, and rectum were photographed. Scope In: 10:33:57 AM Scope Out: 10:50:56 AM Scope Withdrawal Time: 0 hours 13 minutes 4 seconds  Total Procedure Duration: 0 hours 16 minutes 59 seconds  Findings:                 The digital rectal exam was normal.                           Two small angioectasias without bleeding were found                            in the cecum.                           A 8 mm polyp was found in the sigmoid colon. The                            polyp was pedunculated. The polyp was removed with                            a hot snare. Resection and retrieval were complete.  Multiple small-mouthed diverticula were found in                            the sigmoid colon.                           Internal hemorrhoids and small skin tags were found                            during retroflexion. The hemorrhoids were small. Complications:            No immediate complications. Estimated Blood Loss:     Estimated blood loss: none. Impression:               - Two non-bleeding colonic angioectasias.                           - One 8 mm polyp in the sigmoid colon, removed with                            a hot snare. Resected and retrieved.                           - Diverticulosis in the sigmoid colon.                           - Small internal hemorrhoids. Recommendation:           - Patient has a contact number available for                            emergencies. The signs and symptoms of potential                            delayed  complications were discussed with the                            patient. Return to normal activities tomorrow.                            Written discharge instructions were provided to the                            patient.                           - Resume previous diet.                           - Continue present medications.                           - Await pathology results.                           - Repeat colonoscopy for surveillance based on  pathology results.                           - No ibuprofen, naproxen, or other non-steroidal                            anti-inflammatory drugs for 2 weeks after polyp                            removal. Jerene Bears, MD 01/12/2019 10:58:30 AM This report has been signed electronically.

## 2019-01-12 NOTE — Patient Instructions (Signed)
Handouts given on polyps, diverticulosis and hemorrhoids. Absolutely no driving or alcohol today. First meal should be low gas-producing foods such as eggs, grits, toast, pancakes/waffles or lean meat.  YOU HAD AN ENDOSCOPIC PROCEDURE TODAY AT [facility]:   Refer to the procedure report that was given to you for any specific questions about what was found during the examination.  If the procedure report does not answer your questions, please call your gastroenterologist to clarify.  If you requested that your care partner not be given the details of your procedure findings, then the procedure report has been included in a sealed envelope for you to review at your convenience later.  YOU SHOULD EXPECT: Some feelings of bloating in the abdomen. Passage of more gas than usual.  Walking can help get rid of the air that was put into your GI tract during the procedure and reduce the bloating. If you had a lower endoscopy (such as a colonoscopy or flexible sigmoidoscopy) you may notice spotting of blood in your stool or on the toilet paper. If you underwent a bowel prep for your procedure, you may not have a normal bowel movement for a few days.  Please Note:  You might notice some irritation and congestion in your nose or some drainage.  This is from the oxygen used during your procedure.  There is no need for concern and it should clear up in a day or so.  SYMPTOMS TO REPORT IMMEDIATELY:   Following lower endoscopy (colonoscopy or flexible sigmoidoscopy):  Excessive amounts of blood in the stool  Significant tenderness or worsening of abdominal pains  Swelling of the abdomen that is new, acute  Fever of 100F or higher   For urgent or emergent issues, a gastroenterologist can be reached at any hour by calling [phone].   DIET:  We do recommend a small meal at first, but then you may proceed to your regular diet.  Drink plenty of fluids but you should avoid alcoholic  beverages for 24 hours.  ACTIVITY:  You should plan to take it easy for the rest of today and you should NOT DRIVE or use heavy machinery until tomorrow (because of the sedation medicines used during the test).    FOLLOW UP: Our staff will call the number listed on your records the next business day following your procedure to check on you and address any questions or concerns that you may have regarding the information given to you following your procedure. If we do not reach you, we will leave a message.  However, if you are feeling well and you are not experiencing any problems, there is no need to return our call.  We will assume that you have returned to your regular daily activities without incident.  If any biopsies were taken you will be contacted by phone or by letter within the next 1-3 weeks.  Please call us at [phone] if you have not heard about the biopsies in 3 weeks.    SIGNATURES/CONFIDENTIALITY: You and/or your care partner have signed paperwork which will be entered into your electronic medical record.  These signatures attest to the fact that that the information above on your After Visit Summary has been reviewed and is understood.  Full responsibility of the confidentiality of this discharge information lies with you and/or your care-partner.

## 2019-01-13 ENCOUNTER — Telehealth: Payer: Self-pay | Admitting: *Deleted

## 2019-01-13 NOTE — Telephone Encounter (Signed)
No answer for post procedure call back left message for patient to call back with questions or concerns. SM

## 2019-01-13 NOTE — Telephone Encounter (Signed)
  No answer for second post procedure call back . Left message for patient to call with questions or concerns. SM

## 2019-01-14 ENCOUNTER — Encounter: Payer: Self-pay | Admitting: Internal Medicine

## 2019-01-21 ENCOUNTER — Encounter (HOSPITAL_BASED_OUTPATIENT_CLINIC_OR_DEPARTMENT_OTHER): Payer: Self-pay

## 2019-02-05 IMAGING — US ULTRASOUND RIGHT BREAST LIMITED
1 series · 6 of 6 positions shown · non-contrast
Comparison: 08/26/2017.

CLINICAL DATA: Six-month interval follow-up of a likely benign
mildly complex cyst in the INNER RIGHT breast and an adjacent likely
benign focus of clustered cysts/apocrine metaplasia/focal
fibrocystic changes.

EXAM:
ULTRASOUND OF THE RIGHT BREAST

[Series 1: ultrasound right breast limited · 0.06mm/px · 6 of 6 slices shown]
[im 1/6]
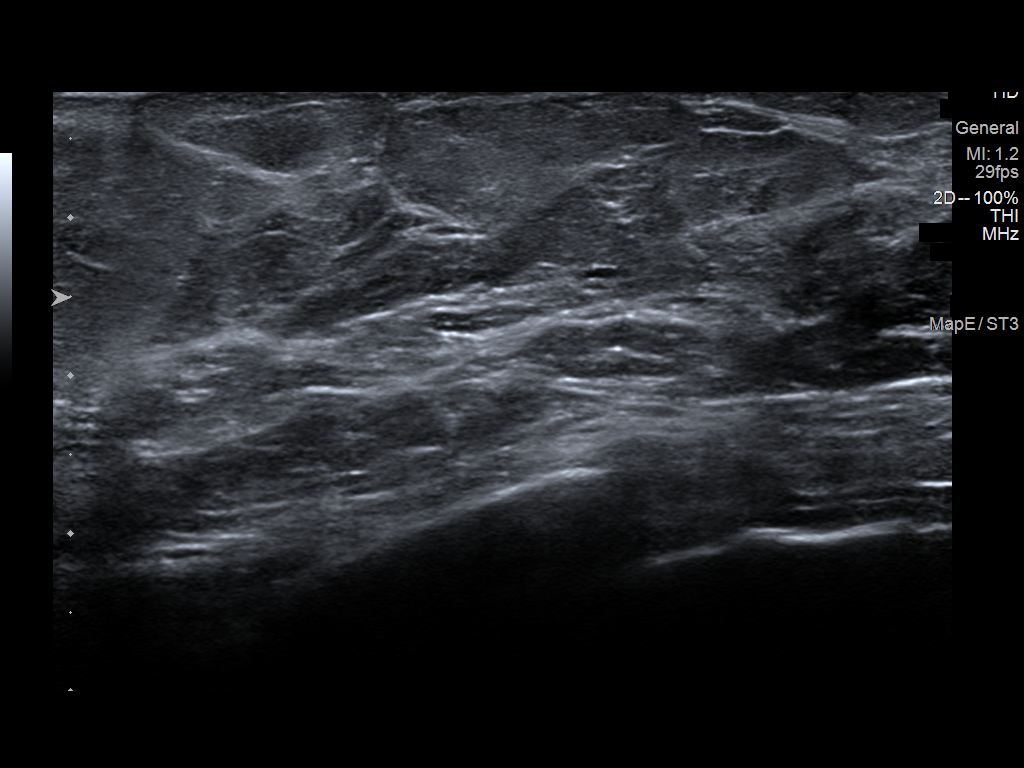
[im 2/6]
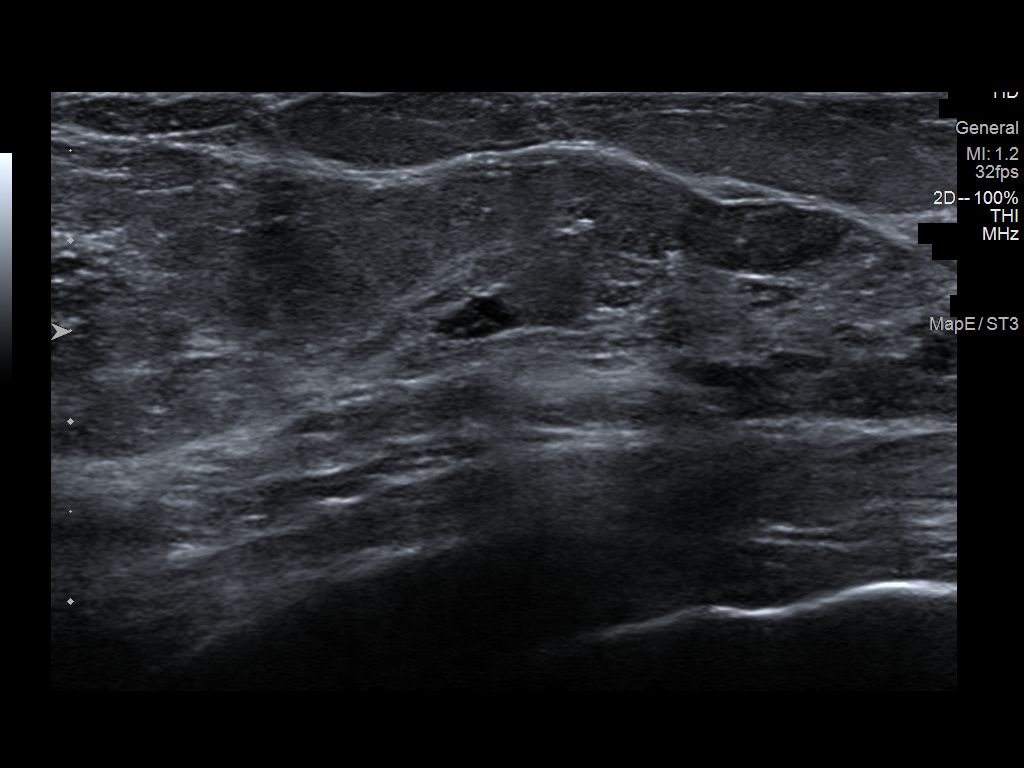
[im 3/6]
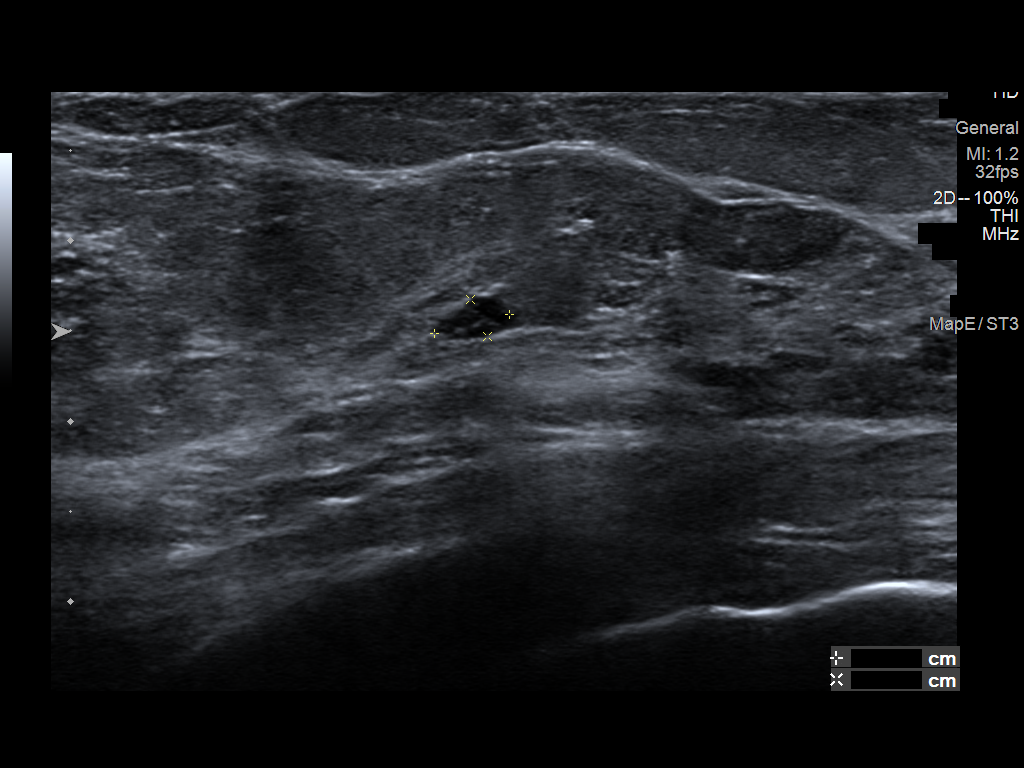
[im 4/6]
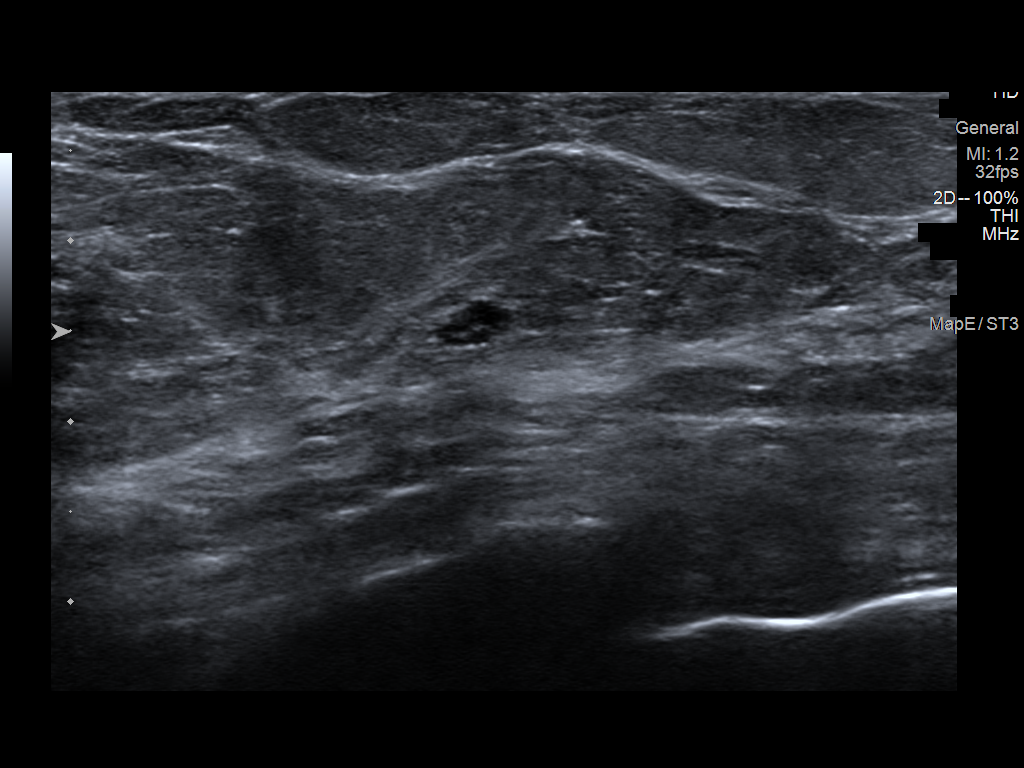
[im 5/6]
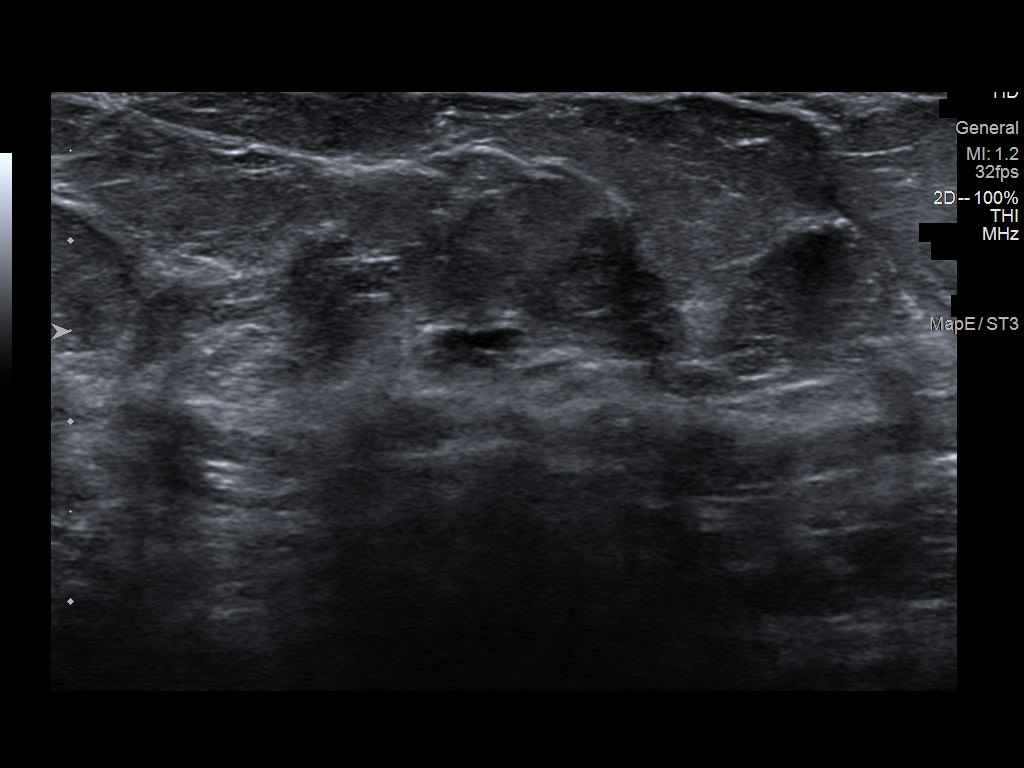
[im 6/6]
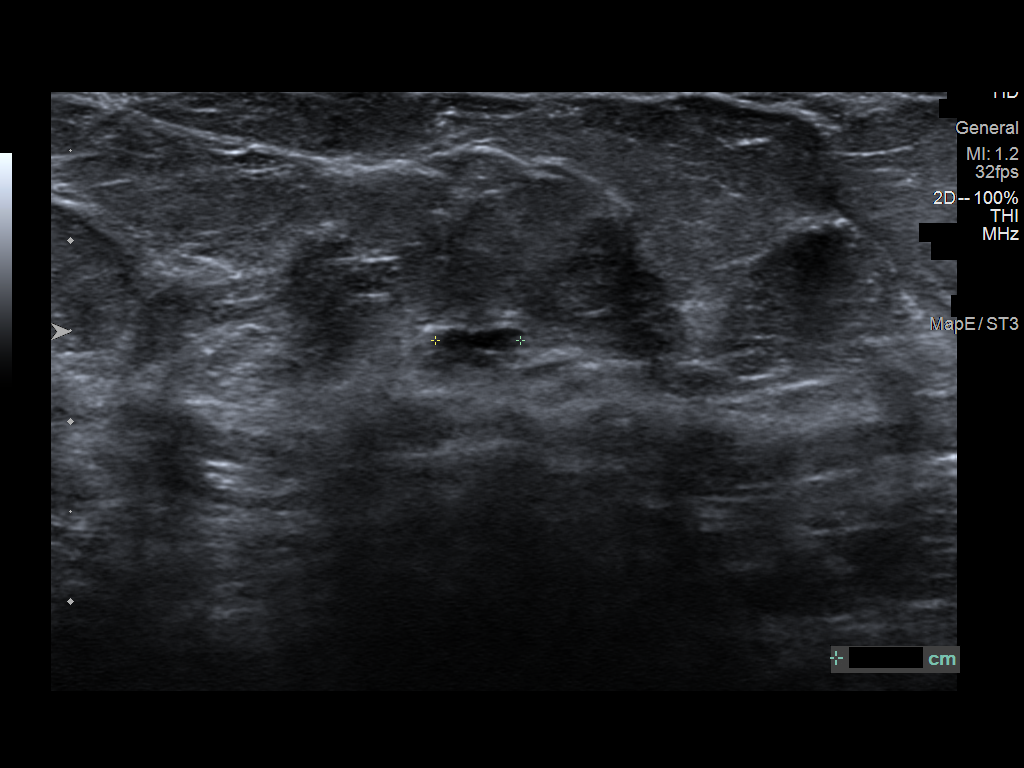

[6 of 6 positions shown; findings below may reference images not displayed]

FINDINGS: Targeted RIGHT breast ultrasound is performed, showing that the
previously identified focus of clustered cysts/apocrine
metaplasia/focal fibrocystic changes has resolved in the interval.
The circumscribed oval parallel nearly anechoic mass with internal
echoes at the 3 o'clock position approximately 2 cm from the nipple
is unchanged, measuring approximately 2 x 4 x 5 mm. No new
suspicious solid mass or abnormal acoustic shadowing is identified.
IMPRESSION: 1. Stable likely benign mildly complex cyst involving the INNER
RIGHT breast at the 3 o'clock position approximately 2 cm from the
nipple.
2. Interval resolution of the focus of clustered cysts/apocrine
metaplasia/focal fibrocystic changes in the INNER RIGHT breast.

RECOMMENDATION:
RIGHT breast ultrasound at the time of annual BILATERAL diagnostic
mammography which is due in late [REDACTED] or August 2018.

I have discussed the findings and recommendations with the patient.
Results were also provided in writing at the conclusion of the
visit. If applicable, a reminder letter will be sent to the patient
regarding the next appointment.

BI-RADS CATEGORY  3: Probably benign.

## 2019-02-17 ENCOUNTER — Ambulatory Visit (INDEPENDENT_AMBULATORY_CARE_PROVIDER_SITE_OTHER): Payer: Medicare Other | Admitting: Family Medicine

## 2019-02-17 ENCOUNTER — Other Ambulatory Visit: Payer: Self-pay

## 2019-02-17 DIAGNOSIS — R0683 Snoring: Secondary | ICD-10-CM | POA: Diagnosis not present

## 2019-02-17 NOTE — Progress Notes (Signed)
Patient ID: Lori Harvey, female   DOB: 1951/07/10, 68 y.o.   MRN: [id number]  Virtual Visit via Video Note  I connected with [name] on 02/17/19 at 11:00 AM EDT by a video enabled telemedicine application and verified that I am speaking with the correct person using two identifiers.  Location patient: home Location provider:work or home office Persons participating in the virtual visit: patient, provider  I discussed the limitations of evaluation and management by telemedicine and the availability of in person appointments. The patient expressed understanding and agreed to proceed.   HPI:  Medicare subsequent annual wellness visit. She has had some issues in the past with benign paroxysmal positional vertigo.  She has history of reported osteopenia which is followed by her GYN.  She has remote history of melanoma.  She had history of depression which is currently followed by psychiatry.  She is on combination therapy with Cymbalta and Wellbutrin.   1.  Risk factors based on Past Medical , Social, and Family history reviewed and as indicated above with no changes  2.  Limitations in physical activities None.  No recent falls.  She feels her balance is good.  She walks for exercise  3.  Depression/mood No active depression or anxiety issues.  PHQ 2 score is 0.  As above, she is on combination therapy with Wellbutrin and Cymbalta and feels her depression is stable  4.  Hearing No deficits  5.  ADLs independent in all.  6.  Cognitive function (orientation to time and place, language, writing, speech,memory) no short or long term memory issues.  Language and judgement intact.  7.  Home Safety no issues  8.  Height, weight, and visual acuity.all stable.  Good eye checkups yearly through ophthalmology  9.  Counseling discussed she has some questions regarding osteoporosis treatments.  We emphasized importance of weightbearing exercise along with adequate calcium and vitamin D  intake  10. Recommendation of preventive services.  Continue with yearly flu vaccine.  Other immunizations up-to-date  11. Labs based on risk factors-none indicated at this time  12. Care Plan- as above.  13. Other Providers-Dr. Talbert [facility]  14. Written schedule of screening/prevention services discussed with patient. -Flu Enza vaccine-annually -Hepatitis C antibody done 2017 -Tetanus due 02/12/2024 -Pneumonia vaccines complete -Colonoscopy due 01/12/2024 -Mammogram due 09/02/2019 -Shingles vaccine complete    ROS: See pertinent positives and negatives per HPI.  Past Medical History:  Diagnosis Date  . Cancer (HCC)    right leg, upper thigh AND CALF melanoma  . Depression   . GERD (gastroesophageal reflux disease)   . Hypertension    Patient denies  . Melanoma ([place])   . Osteopenia   . UTI (lower urinary tract infection)     Past Surgical History:  Procedure Laterality Date  . BREAST BIOPSY Left   . COLONOSCOPY    . MELANOMA EXCISION  2006   right thigh and calf  . REFRACTIVE SURGERY    . TONSILLECTOMY    . WISDOM TOOTH EXTRACTION      Family History  Problem Relation Age of Onset  . Cancer Mother        breast  . Stroke Mother   . Breast cancer Mother   . Depression Mother   . Alcohol abuse Father   . Heart attack Father   . Other Sister 59       colon mass, benign  . Hypertension Sister   . Colon cancer Sister 52  . Depression Sister   .  Colon polyps Sister   . Prostate cancer Brother   . Other Sister 40       colon mass- not colon cancer  . Hypertension Sister   . Colon polyps Sister   . Anxiety disorder Maternal Aunt   . Esophageal cancer Neg Hx   . Rectal cancer Neg Hx   . Stomach cancer Neg Hx     SOCIAL HX: Non-smoker.  Widowed since 2011.  She is retired.   Current Outpatient Medications:  .  alendronate (FOSAMAX) 70 MG tablet, Take 1 tablet (70 mg total) by mouth every 7 (seven) days. Take with a full glass of water on an empty  stomach. (Patient not taking: Reported on 01/12/2019), Disp: 12 tablet, Rfl: 3 .  buPROPion (WELLBUTRIN XL) 150 MG 24 hr tablet, Take 1 tablet (150 mg total) by mouth daily., Disp: 90 tablet, Rfl: 1 .  Cholecalciferol (VITAMIN D3) 125 MCG (5000 UT) CAPS, , Disp: , Rfl:  .  DULoxetine (CYMBALTA) 30 MG capsule, Take with 60 mg capsule to equal total daily dose of 90 mg, Disp: 90 capsule, Rfl: 1 .  DULoxetine (CYMBALTA) 60 MG capsule, Take one capsule with 30 mg capsule to equal total dose of 90 mg daily, Disp: 90 capsule, Rfl: 1 .  fluorouracil (EFUDEX) 5 % cream, 1 APPLICATION ONCE A DAY EXTERNALLY, Disp: , Rfl:  .  Multiple Vitamin (MULTIVITAMIN) capsule, Take 1 capsule by mouth daily. Juice Plus, Disp: , Rfl:  .  Omega-3 Fatty Acids (FISH OIL) 1000 MG CAPS, Take by mouth., Disp: , Rfl:  .  Probiotic Product (PROBIOTIC DAILY PO), Take by mouth., Disp: , Rfl:  .  promethazine (PHENERGAN) 12.5 MG tablet, Take 1 tablet (12.5 mg total) by mouth every 6 (six) hours as needed for nausea or vomiting. (Patient not taking: Reported on 08/06/2018), Disp: 30 tablet, Rfl: 0 .  traZODone (DESYREL) 50 MG tablet, Take 1 tablet (50 mg total) by mouth at bedtime as needed for sleep. Half tab at bedtime as needed, Disp: 90 tablet, Rfl: 1 .  tretinoin (RETIN-A) 3.570 % cream, 1 APPLICATION TO AFFECTED AREA IN THE EVENING TO FACE ONCE A DAY, Disp: , Rfl:  .  zolpidem (AMBIEN) 10 MG tablet, Take 1 tablet (10 mg total) by mouth at bedtime as needed for sleep. 1/2 - 1 po qhs prn (Patient not taking: Reported on 12/30/2018), Disp: 15 tablet, Rfl: 5  EXAM:  VITALS per patient if applicable:  GENERAL: alert, oriented, appears well and in no acute distress  HEENT: atraumatic, conjunttiva clear, no obvious abnormalities on inspection of external nose and ears  NECK: normal movements of the head and neck  LUNGS: on inspection no signs of respiratory distress, breathing rate appears normal, no obvious gross SOB, gasping or  wheezing  CV: no obvious cyanosis  MS: moves all visible extremities without noticeable abnormality  PSYCH/NEURO: pleasant and cooperative, no obvious depression or anxiety, speech and thought processing grossly intact  ASSESSMENT AND PLAN:  Discussed the following assessment and plan:  Medicare subsequent annual wellness visit.  Discussed the following  -Immunizations up-to-date -Repeat tetanus 2025 -Repeat colonoscopy 2025 -We will set up ENT referral to further evaluate her snoring at her request.  She also has sleep study pending to rule out obstructive sleep apnea     I discussed the assessment and treatment plan with the patient. The patient was provided an opportunity to ask questions and all were answered. The patient agreed with the plan and demonstrated  an understanding of the instructions.   The patient was advised to call back or seek an in-person evaluation if the symptoms worsen or if the condition fails to improve as anticipated.   Carolann Littler, MD

## 2019-02-24 ENCOUNTER — Other Ambulatory Visit: Payer: Self-pay | Admitting: Psychiatry

## 2019-02-24 DIAGNOSIS — F3342 Major depressive disorder, recurrent, in full remission: Secondary | ICD-10-CM

## 2019-02-24 DIAGNOSIS — F5101 Primary insomnia: Secondary | ICD-10-CM

## 2019-03-03 ENCOUNTER — Encounter (HOSPITAL_BASED_OUTPATIENT_CLINIC_OR_DEPARTMENT_OTHER): Payer: Self-pay

## 2019-03-03 ENCOUNTER — Other Ambulatory Visit: Payer: Self-pay

## 2019-03-03 ENCOUNTER — Ambulatory Visit (INDEPENDENT_AMBULATORY_CARE_PROVIDER_SITE_OTHER): Payer: Medicare Other | Admitting: Psychiatry

## 2019-03-03 ENCOUNTER — Encounter: Payer: Self-pay | Admitting: Psychiatry

## 2019-03-03 DIAGNOSIS — F3342 Major depressive disorder, recurrent, in full remission: Secondary | ICD-10-CM | POA: Diagnosis not present

## 2019-03-03 DIAGNOSIS — F5101 Primary insomnia: Secondary | ICD-10-CM

## 2019-03-03 MED ORDER — DULOXETINE HCL 30 MG PO CPEP: ORAL_CAPSULE | ORAL | 1 refills | Status: DC

## 2019-03-03 MED ORDER — BUPROPION HCL ER (XL) 150 MG PO TB24: 150.0000 mg | ORAL_TABLET | Freq: Every day | ORAL | 1 refills | Status: DC

## 2019-03-03 MED ORDER — DULOXETINE HCL 60 MG PO CPEP: ORAL_CAPSULE | ORAL | 1 refills | Status: DC

## 2019-03-03 NOTE — Progress Notes (Signed)
Lori Harvey [id number] 1951-05-11 68 y.o.  Virtual Visit via Telephone Note  I connected with@ on 03/03/19 at  9:00 AM EDT by telephone and verified that I am speaking with the correct person using two identifiers.   I discussed the limitations, risks, security and privacy concerns of performing an evaluation and management service by telephone and the availability of in person appointments. I also discussed with the patient that there may be a patient responsible charge related to this service. The patient expressed understanding and agreed to proceed.   I discussed the assessment and treatment plan with the patient. The patient was provided an opportunity to ask questions and all were answered. The patient agreed with the plan and demonstrated an understanding of the instructions.   The patient was advised to call back or seek an in-person evaluation if the symptoms worsen or if the condition fails to improve as anticipated.  I provided 30 minutes of non-face-to-face time during this encounter.  The patient was located at home.  The provider was located at home.   Thayer Headings, PMHNP   Subjective:   Patient ID:  Lori Harvey is a 68 y.o. (DOB 11-Jun-1951) female.  Chief Complaint:  Chief Complaint  Patient presents with  . Follow-up    h/o depression, anxiety, insomnia    HPI Lori Harvey presents for follow-up of anxiety, depression, and insomnia. She reports, "I've been doing fine." She reports that her sister died last week. 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 She denies increased anxiety in response to the pandemic.She reports that her mood has been good. She reports that she has a close gentleman friend that she has been enjoying spending time with. She reports that she has been communicating with friends over zoom sessions, phone calls, and meeting at a distance. She reports increased food intake with friend cooking frequently. She reports that  her energy and motivation have been ok. She reports that her sleep has been better recently. Has not used Trazodone for several weeks. She reports that when she takes 1/2 tab Trazodone that it is effective for her. She reports that sleeps 6-7 hours a night. She reports adequate concentration. Denies SI.  She reports that she has discovered that she has a "significant snore." She reports that she has a sleep study scheduled. Reports that she tried different strategies to reduce snoring to include mouthpieces.   Review of Systems:  Review of Systems  Musculoskeletal: Negative for gait problem.  Neurological: Negative for tremors.       "My hands are not as steady."   Psychiatric/Behavioral:       Please refer to HPI    Medications: I have reviewed the patient's current medications.  Current Outpatient Medications  Medication Sig Dispense Refill  . alendronate (FOSAMAX) 70 MG tablet Take 1 tablet (70 mg total) by mouth every 7 (seven) days. Take with a full glass of water on an empty stomach. 12 tablet 3  . buPROPion (WELLBUTRIN XL) 150 MG 24 hr tablet Take 1 tablet (150 mg total) by mouth daily. 90 tablet 1  . Cholecalciferol (VITAMIN D3) 125 MCG (5000 UT) CAPS     . DULoxetine (CYMBALTA) 30 MG capsule Take with 60 mg capsule to equal total daily dose of 90 mg 90 capsule 1  . DULoxetine (CYMBALTA) 60 MG capsule Take one capsule with 30 mg capsule to equal total dose of 90 mg daily 90 capsule 1  . Multiple Vitamin (MULTIVITAMIN) capsule Take  1 capsule by mouth daily. Juice Plus    . Omega-3 Fatty Acids (FISH OIL) 1000 MG CAPS Take by mouth.    . Probiotic Product (PROBIOTIC DAILY PO) Take by mouth.    . traZODone (DESYREL) 50 MG tablet TAKE 1 TAB BY MOUTH AT BEDTIME AS NEEDED FOR SLEEP. 1/2 TAB AT BEDTIME AS NEEDED 90 tablet 1  . tretinoin (RETIN-A) 4.765 % cream 1 APPLICATION TO AFFECTED AREA IN THE EVENING TO FACE ONCE A DAY    . fluorouracil (EFUDEX) 5 % cream 1 APPLICATION ONCE A DAY  EXTERNALLY    . promethazine (PHENERGAN) 12.5 MG tablet Take 1 tablet (12.5 mg total) by mouth every 6 (six) hours as needed for nausea or vomiting. (Patient not taking: Reported on 03/03/2019) 30 tablet 0  . zolpidem (AMBIEN) 10 MG tablet Take 1 tablet (10 mg total) by mouth at bedtime as needed for sleep. 1/2 - 1 po qhs prn (Patient not taking: Reported on 12/30/2018) 15 tablet 5   No current facility-administered medications for this visit.     Medication Side Effects: Other: Dry mouth, occ mild night sweats  Allergies:  Allergies  Allergen Reactions  . Keflex [Cephalexin]     Nausea, hives  . Sulfa Antibiotics Rash    Past Medical History:  Diagnosis Date  . Cancer (HCC)    right leg, upper thigh AND CALF melanoma  . Depression   . GERD (gastroesophageal reflux disease)   . Hypertension    Patient denies  . Melanoma ([place])   . Osteopenia   . UTI (lower urinary tract infection)     Family History  Problem Relation Age of Onset  . Cancer Mother        breast  . Stroke Mother   . Breast cancer Mother   . Depression Mother   . Alcohol abuse Father   . Heart attack Father   . Other Sister 39       colon mass, benign  . Hypertension Sister   . Colon cancer Sister 27  . Depression Sister   . Colon polyps Sister   . Prostate cancer Brother   . Other Sister 16       colon mass- not colon cancer  . Hypertension Sister   . Colon polyps Sister   . Anxiety disorder Maternal Aunt   . Esophageal cancer Neg Hx   . Rectal cancer Neg Hx   . Stomach cancer Neg Hx     Social History   Socioeconomic History  . Marital status: Widowed    Spouse name: Not on file  . Number of children: Not on file  . Years of education: Not on file  . Highest education level: Not on file  Occupational History  . Not on file  Social Needs  . Financial resource strain: Not on file  . Food insecurity:    Worry: Not on file    Inability: Not on file  . Transportation needs:    Medical: Not  on file    Non-medical: Not on file  Tobacco Use  . Smoking status: Never Smoker  . Smokeless tobacco: Never Used  Substance and Sexual Activity  . Alcohol use: Yes    Alcohol/week: 1.0 - 2.0 standard drinks    Types: 1 - 2 Glasses of wine per week  . Drug use: No  . Sexual activity: Not Currently    Birth control/protection: Post-menopausal  Lifestyle  . Physical activity:    Days per week:  Not on file    Minutes per session: Not on file  . Stress: Not on file  Relationships  . Social connections:    Talks on phone: Not on file    Gets together: Not on file    Attends religious service: Not on file    Active member of club or organization: Not on file    Attends meetings of clubs or organizations: Not on file    Relationship status: Not on file  . Intimate partner violence:    Fear of current or ex partner: Not on file    Emotionally abused: Not on file    Physically abused: Not on file    Forced sexual activity: Not on file  Other Topics Concern  . Not on file  Social History Narrative  . Not on file    Past Medical History, Surgical history, Social history, and Family history were reviewed and updated as appropriate.   Please see review of systems for further details on the patient's review from today.   Objective:   Physical Exam:  LMP 08/29/2002 (Approximate)   Physical Exam Neurological:     Mental Status: She is alert and oriented to person, place, and time.     Cranial Nerves: No dysarthria.  Psychiatric:        Attention and Perception: Attention normal.        Mood and Affect: Mood normal.        Speech: Speech normal.        Behavior: Behavior is cooperative.        Thought Content: Thought content normal. Thought content is not paranoid or delusional. Thought content does not include homicidal or suicidal ideation. Thought content does not include homicidal or suicidal plan.        Cognition and Memory: Cognition and memory normal.        Judgment:  Judgment normal.     Lab Review:     Component Value Date/Time   NA 134 (L) 01/08/2019 1125   NA 140 10/01/2018 0850   K 4.1 01/08/2019 1125   CL 98 01/08/2019 1125   CO2 28 01/08/2019 1125   GLUCOSE 104 (H) 01/08/2019 1125   BUN 17 01/08/2019 1125   BUN 20 10/01/2018 0850   CREATININE 0.94 01/08/2019 1125   CREATININE 0.87 07/16/2016 1455   CALCIUM 9.2 01/08/2019 1125   PROT 6.6 08/27/2018 0832   ALBUMIN 4.1 10/01/2018 0850   AST 22 08/27/2018 0832   ALT 13 08/27/2018 0832   ALKPHOS 69 08/27/2018 0832   BILITOT 0.5 08/27/2018 0832   GFRNONAA 56 (L) 10/01/2018 0850   GFRAA 64 10/01/2018 0850       Component Value Date/Time   WBC 7.1 08/27/2018 0832   WBC 7.7 07/16/2016 1455   RBC 4.88 08/27/2018 0832   RBC 4.63 07/16/2016 1455   HGB 13.5 08/27/2018 0832   HGB 13.7 02/11/2014 1304   HCT 41.4 08/27/2018 0832   PLT 318 08/27/2018 0832   MCV 85 08/27/2018 0832   MCH 27.7 08/27/2018 0832   MCH 28.1 07/16/2016 1455   MCHC 32.6 08/27/2018 0832   MCHC 33.2 07/16/2016 1455   RDW 13.2 08/27/2018 0832   LYMPHSABS 2,695 07/16/2016 1455   MONOABS 539 07/16/2016 1455   EOSABS 77 07/16/2016 1455   BASOSABS 0 07/16/2016 1455    No results found for: POCLITH, LITHIUM   No results found for: PHENYTOIN, PHENOBARB, VALPROATE, CBMZ   .res Assessment: Plan:   Will  continue current plan of care since mood and insomnia signs and symptoms are currently well controlled. Continue Cymbalta 90 mg daily for anxiety and depression. Continue Wellbutrin XL 150 mg daily for depression. Continue trazodone 50 mg 1/2-1 tab p.o. nightly as needed insomnia. Major depressive disorder, recurrent episode, in full remission ([place]) - Plan: buPROPion (WELLBUTRIN XL) 150 MG 24 hr tablet, DULoxetine (CYMBALTA) 30 MG capsule, DULoxetine (CYMBALTA) 60 MG capsule  Primary insomnia  Please see After Visit Summary for patient specific instructions.  Future Appointments  Date Time Provider [place]  08/12/2019  1:30 PM Salvadore Dom, MD [place] None    No orders of the defined types were placed in this encounter.     -------------------------------

## 2019-04-08 ENCOUNTER — Telehealth: Payer: Self-pay | Admitting: Obstetrics and Gynecology

## 2019-04-08 ENCOUNTER — Encounter: Payer: Self-pay | Admitting: Obstetrics and Gynecology

## 2019-04-08 NOTE — Progress Notes (Addendum)
GYNECOLOGY  VISIT   HPI: 68 y.o.   Widowed White or Caucasian Not Hispanic or Latino  female   [id number] with Patient's last menstrual period was 08/29/2002 (approximate).   here for light spotting after intercourse for the last month.  Just started a new relationship. She has been sexually active for the last month. Sex is painful on entry, not sure if she is tearing. Has dryness. She has noticed a discharge with a tint of blood after sex, happens once and not every time she is sexually active. Vagina feels dry. She has noticed a slight odor. Partner has erectile dysfunction. Hasn't tried lubricant (feels penetration will be more difficult with the ED).  She reports decreased responsiveness. Able to have orgasm, more difficult.  No blood other than just after intercourse.   GYNECOLOGIC HISTORY: Patient's last menstrual period was 08/29/2002 (approximate). Contraception: Postmenopausal Menopausal hormone therapy: None        OB History    Gravida  1   Para  0   Term  0   Preterm  0   AB  1   Living  0     SAB  0   TAB  1   Ectopic  0   Multiple  0   Live Births  0              Patient Active Problem List   Diagnosis Date Noted  . Major depressive disorder, recurrent episode, mild ([place]) 08/24/2018  . Major depressive disorder, recurrent episode, in full remission ([place]) 07/14/2018  . Pain in joint of right elbow 12/04/2017  . Osteopenia 02/11/2014  . Benign paroxysmal positional vertigo 02/11/2014  . Melanoma ([place]) 03/31/2012  . History of depression 03/31/2012  . Elevated blood pressure 03/31/2012    Past Medical History:  Diagnosis Date  . Cancer (HCC)    right leg, upper thigh AND CALF melanoma  . Depression   . GERD (gastroesophageal reflux disease)   . Hypertension    Patient denies  . Melanoma ([place])   . Osteopenia   . UTI (lower urinary tract infection)     Past Surgical History:  Procedure Laterality Date  . BREAST BIOPSY Left   . COLONOSCOPY     . MELANOMA EXCISION  2006   right thigh and calf  . REFRACTIVE SURGERY    . TONSILLECTOMY    . WISDOM TOOTH EXTRACTION      Current Outpatient Medications  Medication Sig Dispense Refill  . alendronate (FOSAMAX) 70 MG tablet Take 1 tablet (70 mg total) by mouth every 7 (seven) days. Take with a full glass of water on an empty stomach. 12 tablet 3  . buPROPion (WELLBUTRIN XL) 150 MG 24 hr tablet Take 1 tablet (150 mg total) by mouth daily. 90 tablet 1  . Cholecalciferol (VITAMIN D3) 125 MCG (5000 UT) CAPS     . DULoxetine (CYMBALTA) 30 MG capsule Take with 60 mg capsule to equal total daily dose of 90 mg 90 capsule 1  . DULoxetine (CYMBALTA) 60 MG capsule Take one capsule with 30 mg capsule to equal total dose of 90 mg daily 90 capsule 1  . Multiple Vitamin (MULTIVITAMIN) capsule Take 1 capsule by mouth daily. Juice Plus    . Omega-3 Fatty Acids (FISH OIL) 1000 MG CAPS Take by mouth.    . Probiotic Product (PROBIOTIC DAILY PO) Take by mouth.    . promethazine (PHENERGAN) 12.5 MG tablet Take 1 tablet (12.5 mg total) by mouth every  6 (six) hours as needed for nausea or vomiting. 30 tablet 0  . traZODone (DESYREL) 50 MG tablet TAKE 1 TAB BY MOUTH AT BEDTIME AS NEEDED FOR SLEEP. 1/2 TAB AT BEDTIME AS NEEDED 90 tablet 1   No current facility-administered medications for this visit.      ALLERGIES: Keflex [cephalexin] and Sulfa antibiotics  Family History  Problem Relation Age of Onset  . Cancer Mother        breast  . Stroke Mother   . Breast cancer Mother   . Depression Mother   . Alcohol abuse Father   . Heart attack Father   . Other Sister 45       colon mass, benign  . Hypertension Sister   . Colon cancer Sister 59  . Depression Sister   . Colon polyps Sister   . Prostate cancer Brother   . Other Sister 31       colon mass- not colon cancer  . Hypertension Sister   . Colon polyps Sister   . Anxiety disorder Maternal Aunt   . Esophageal cancer Neg Hx   . Rectal cancer  Neg Hx   . Stomach cancer Neg Hx     Social History   Socioeconomic History  . Marital status: Widowed    Spouse name: Not on file  . Number of children: Not on file  . Years of education: Not on file  . Highest education level: Not on file  Occupational History  . Not on file  Social Needs  . Financial resource strain: Not on file  . Food insecurity    Worry: Not on file    Inability: Not on file  . Transportation needs    Medical: Not on file    Non-medical: Not on file  Tobacco Use  . Smoking status: Never Smoker  . Smokeless tobacco: Never Used  Substance and Sexual Activity  . Alcohol use: Yes    Alcohol/week: 1.0 - 2.0 standard drinks    Types: 1 - 2 Glasses of wine per week  . Drug use: No  . Sexual activity: Yes    Birth control/protection: Post-menopausal  Lifestyle  . Physical activity    Days per week: Not on file    Minutes per session: Not on file  . Stress: Not on file  Relationships  . Social Herbalist on phone: Not on file    Gets together: Not on file    Attends religious service: Not on file    Active member of club or organization: Not on file    Attends meetings of clubs or organizations: Not on file    Relationship status: Not on file  . Intimate partner violence    Fear of current or ex partner: Not on file    Emotionally abused: Not on file    Physically abused: Not on file    Forced sexual activity: Not on file  Other Topics Concern  . Not on file  Social History Narrative  . Not on file    Review of Systems  Constitutional: Negative.   HENT: Negative.   Eyes: Negative.   Respiratory: Negative.   Cardiovascular: Negative.   Gastrointestinal: Negative.   Genitourinary:       Spotting after intercourse  Musculoskeletal: Negative.   Skin: Negative.   Neurological: Negative.   Endo/Heme/Allergies: Negative.   Psychiatric/Behavioral: Negative.     PHYSICAL EXAMINATION:    BP 140/76 (BP Location: Right Arm, Patient  Position: Sitting, Cuff Size: Normal)   Pulse 84   Temp 97.9 F (36.6 C) (Skin)   Wt 157 lb (71.2 kg)   LMP 08/29/2002 (Approximate)   BMI 25.34 kg/m     General appearance: alert, cooperative and appears stated age  Pelvic: External genitalia:  no lesions, atrophic              Urethra:  normal appearing urethra with no masses, tenderness or lesions              Bartholins and Skenes: normal                 Vagina: very atrophic, narrow appearing vagina. The vagina is erythematous, slight increase in watery, yellow vaginal d/c. No lacerations noted              Cervix: no lesions              Bimanual Exam:  Uterus:  normal size, contour, position, consistency, mobility, non-tender              Adnexa: no mass, fullness, tenderness               Chaperone was present for exam.  ASSESSMENT Vaginal atrophy Recently sexually active again Dyspareunia Postcoital spotting c/w vaginal atrophy, irritation, suspect she is having small tearing    PLAN Use lubrication with intercourse Start vaginal estrogen, reviewed risks F/U in one month If she continues to bleed after improvement of her vaginal atrophy will set her up for a TV ultrasound Information on libido and orgasms given   An After Visit Summary was printed and given to the patient.  ~20 minutes face to face time of which over 50% was spent in counseling.   Addendum: vaginal odor, affirm sent

## 2019-04-08 NOTE — Telephone Encounter (Signed)
Spoke with patient. Patient recently become SA again after 9 yrs. Reports vaginal dryness, tenderness, vaginal odor and watery/bloody discharge after intercourse. Started 1 mo ago. Denies pelvic pain, fever/chills, urinary symptoms. Would like to start vaginal cream to help with dryness. Patient is leaving for the beach, will be gone all next week.   Advised OV needed for further evaluation, OV scheduled for 6/11 at 9:30am with Dr. Talbert Nan.   Last AEX 08/06/18  Routing to provider for final review. Patient is agreeable to disposition. Will close encounter.

## 2019-04-08 NOTE — Telephone Encounter (Signed)
Patient sent the following message through [facility]. Routing to triage to assist patient with request.  Dr. Talbert Nan offered a vaginal treatment for dryness etc . After 9 years, I am now in an intimate relationship with a gentleman and find that having intercourse is difficult with the vaginal dryness, tight & tender tissue & lack of responsiveness. After penetration, I occassionally have a slight bloody/watery discharge. I'd like to have a vaginal cream to help with a more comfortable experience. Stilll use the [facility] in [employer] on [address].     Also, what is today's recommendation for cleansing the internal vaginal area of slight odor which seems not to be fresh & clean ie in the past there was a douche etc.     Thanks!!

## 2019-04-09 ENCOUNTER — Other Ambulatory Visit: Payer: Self-pay

## 2019-04-09 ENCOUNTER — Encounter: Payer: Self-pay | Admitting: Obstetrics and Gynecology

## 2019-04-09 ENCOUNTER — Other Ambulatory Visit: Payer: Self-pay | Admitting: Obstetrics and Gynecology

## 2019-04-09 ENCOUNTER — Ambulatory Visit (INDEPENDENT_AMBULATORY_CARE_PROVIDER_SITE_OTHER): Payer: Medicare Other | Admitting: Obstetrics and Gynecology

## 2019-04-09 VITALS — BP 140/76 | HR 84 | Temp 97.9°F | Wt 157.0 lb

## 2019-04-09 DIAGNOSIS — N952 Postmenopausal atrophic vaginitis: Secondary | ICD-10-CM | POA: Diagnosis not present

## 2019-04-09 DIAGNOSIS — N898 Other specified noninflammatory disorders of vagina: Secondary | ICD-10-CM

## 2019-04-09 DIAGNOSIS — N941 Unspecified dyspareunia: Secondary | ICD-10-CM

## 2019-04-09 DIAGNOSIS — N930 Postcoital and contact bleeding: Secondary | ICD-10-CM

## 2019-04-09 MED ORDER — ESTRADIOL 10 MCG VA TABS: ORAL_TABLET | VAGINAL | 0 refills | Status: DC

## 2019-04-09 NOTE — Progress Notes (Signed)
Order placed

## 2019-04-09 NOTE — Patient Instructions (Signed)
Atrophic Vaginitis Atrophic vaginitis is a condition in which the tissues that line the vagina become dry and thin. This condition occurs in women who have stopped having their period. It is caused by a drop in a female hormone (estrogen). This hormone helps:  To keep the vagina moist.  To make a clear fluid. This clear fluid helps: ? To make the vagina ready for sex. ? To protect the vagina from infection. If the lining of the vagina is dry and thin, it may cause irritation, burning, or itchiness. It may also:  Make sex painful.  Make an exam of your vagina painful.  Cause bleeding.  Make you lose interest in sex.  Cause a burning feeling when you pee (urinate).  Cause a brown or yellow fluid to come from your vagina. Some women do not have symptoms. Follow these instructions at home: Medicines  Take over-the-counter and prescription medicines only as told by your doctor.  Do not use herbs or other medicines unless your doctor says it is okay.  Use medicines for for dryness. These include: ? Oils to make the vagina soft. ? Creams. ? Moisturizers. General instructions  Do not douche.  Do not use products that can make your vagina dry. These include: ? Scented sprays. ? Scented tampons. ? Scented soaps.  Sex can help increase blood flow and soften the tissue in the vagina. If it hurts to have sex: ? Tell your partner. ? Use products to make sex more comfortable. Use these only as told by your doctor. Contact a doctor if you:  Have discharge from the vagina that is different than usual.  Have a bad smell coming from your vagina.  Have new symptoms.  Do not get better.  Get worse. Summary  Atrophic vaginitis is a condition in which the lining of the vagina becomes dry and thin.  This condition affects women who have stopped having their periods.  Treatment may include using products that help make the vagina soft.  Call a doctor if do not get better with  treatment. This information is not intended to replace advice given to you by your health care provider. Make sure you discuss any questions you have with your health care provider. Document Released: 04/02/2008 Document Revised: 10/28/2017 Document Reviewed: 10/28/2017 Elsevier Interactive Patient Education  2019 [employer]  

## 2019-04-10 ENCOUNTER — Other Ambulatory Visit: Payer: Self-pay | Admitting: Obstetrics and Gynecology

## 2019-04-10 LAB — VAGINITIS/VAGINOSIS, DNA PROBE
Candida Species: NEGATIVE
Gardnerella vaginalis: NEGATIVE
Trichomonas vaginosis: NEGATIVE

## 2019-04-13 ENCOUNTER — Encounter: Payer: Self-pay | Admitting: Obstetrics and Gynecology

## 2019-04-13 ENCOUNTER — Telehealth: Payer: Self-pay | Admitting: Obstetrics and Gynecology

## 2019-04-13 NOTE — Telephone Encounter (Signed)
Spoke with patient. Reviewed directions for Vagifem vaginal tablets. She voiced understanding. She has follow up appointment 05-07-19 with Dr.Jertson.

## 2019-04-13 NOTE — Telephone Encounter (Signed)
Started use of Estradiol. Written directions say "Use one tablet qhs x 1 week........."    I think Dr. Talbert Nan said use one tablet for 5 days..........vs my literal "1 week= 7 days"    Should I use it 5 or 7 days before switching to twice a week? (my memory may be wrong)    Thanks!!

## 2019-05-06 ENCOUNTER — Ambulatory Visit: Payer: Medicare Other | Admitting: Obstetrics and Gynecology

## 2019-05-06 NOTE — Progress Notes (Deleted)
GYNECOLOGY  VISIT   HPI: 68 y.o.   Widowed White or Caucasian Not Hispanic or Latino  female   G1P0010 with Patient's last menstrual period was 08/29/2002 (approximate).   here for 1 month follow up on vaginal estrogen.    GYNECOLOGIC HISTORY: Patient's last menstrual period was 08/29/2002 (approximate). Contraception:*** Menopausal hormone therapy: ***        OB History    Gravida  1   Para  0   Term  0   Preterm  0   AB  1   Living  0     SAB  0   TAB  1   Ectopic  0   Multiple  0   Live Births  0              Patient Active Problem List   Diagnosis Date Noted  . 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 Major depressive disorder, recurrent episode, in full remission ([place]) 07/14/2018  . Pain in joint of right elbow 12/04/2017  . Osteopenia 02/11/2014  . Benign paroxysmal positional vertigo 02/11/2014  . Melanoma ([place]) 03/31/2012  . History of depression 03/31/2012  . Elevated blood pressure 03/31/2012    Past Medical History:  Diagnosis Date  . Cancer (HCC)    right leg, upper thigh AND CALF melanoma  . Depression   . GERD (gastroesophageal reflux disease)   . Hypertension    Patient denies  . Melanoma ([place])   . Osteopenia   . UTI (lower urinary tract infection)     Past Surgical History:  Procedure Laterality Date  . BREAST BIOPSY Left   . COLONOSCOPY    . MELANOMA EXCISION  2006   right thigh and calf  . REFRACTIVE SURGERY    . TONSILLECTOMY    . WISDOM TOOTH EXTRACTION      Current Outpatient Medications  Medication Sig Dispense Refill  . alendronate (FOSAMAX) 70 MG tablet Take 1 tablet (70 mg total) by mouth every 7 (seven) days. Take with a full glass of water on an empty stomach. 12 tablet 3  . buPROPion (WELLBUTRIN XL) 150 MG 24 hr tablet Take 1 tablet (150 mg total) by mouth daily. 90 tablet 1  . Cholecalciferol (VITAMIN D3) 125 MCG (5000 UT) CAPS     . DULoxetine (CYMBALTA) 30 MG capsule Take  with 60 mg capsule to equal total daily dose of 90 mg 90 capsule 1  . DULoxetine (CYMBALTA) 60 MG capsule Take one capsule with 30 mg capsule to equal total dose of 90 mg daily 90 capsule 1  . Estradiol 10 MCG TABS vaginal tablet Use one tablet qhs x 1 week, then space to 1 tablet at hs 2 x a week. 24 tablet 0  . Multiple Vitamin (MULTIVITAMIN) capsule Take 1 capsule by mouth daily. Juice Plus    . Omega-3 Fatty Acids (FISH OIL) 1000 MG CAPS Take by mouth.    . Probiotic Product (PROBIOTIC DAILY PO) Take by mouth.    . promethazine (PHENERGAN) 12.5 MG tablet Take 1 tablet (12.5 mg total) by mouth every 6 (six) hours as needed for nausea or vomiting. 30 tablet 0  . traZODone (DESYREL) 50 MG tablet TAKE 1 TAB BY MOUTH AT BEDTIME AS NEEDED FOR SLEEP. 1/2 TAB AT BEDTIME AS NEEDED 90 tablet 1   No current facility-administered medications for this visit.      ALLERGIES: Keflex [cephalexin] and Sulfa antibiotics  Family History  Problem Relation Age  of Onset  . Cancer Mother        breast  . Stroke Mother   . Breast cancer Mother   . Depression Mother   . Alcohol abuse Father   . Heart attack Father   . Other Sister 46       colon mass, benign  . Hypertension Sister   . Colon cancer Sister 9  . Depression Sister   . Colon polyps Sister   . Prostate cancer Brother   . Other Sister 3       colon mass- not colon cancer  . Hypertension Sister   . Colon polyps Sister   . Anxiety disorder Maternal Aunt   . Esophageal cancer Neg Hx   . Rectal cancer Neg Hx   . Stomach cancer Neg Hx     Social History   Socioeconomic History  . Marital status: Widowed    Spouse name: Not on file  . Number of children: Not on file  . Years of education: Not on file  . Highest education level: Not on file  Occupational History  . Not on file  Social Needs  . Financial resource strain: Not on file  . Food insecurity    Worry: Not on file    Inability: Not on file  . Transportation needs     Medical: Not on file    Non-medical: Not on file  Tobacco Use  . Smoking status: Never Smoker  . Smokeless tobacco: Never Used  Substance and Sexual Activity  . Alcohol use: Yes    Alcohol/week: 1.0 - 2.0 standard drinks    Types: 1 - 2 Glasses of wine per week  . Drug use: No  . Sexual activity: Yes    Birth control/protection: Post-menopausal  Lifestyle  . Physical activity    Days per week: Not on file    Minutes per session: Not on file  . Stress: Not on file  Relationships  . Social Herbalist on phone: Not on file    Gets together: Not on file    Attends religious service: Not on file    Active member of club or organization: Not on file    Attends meetings of clubs or organizations: Not on file    Relationship status: Not on file  . Intimate partner violence    Fear of current or ex partner: Not on file    Emotionally abused: Not on file    Physically abused: Not on file    Forced sexual activity: Not on file  Other Topics Concern  . Not on file  Social History Narrative  . Not on file    ROS  PHYSICAL EXAMINATION:    LMP 08/29/2002 (Approximate)     General appearance: alert, cooperative and appears stated age Neck: no adenopathy, supple, symmetrical, trachea midline and thyroid {CHL AMB PHY EX THYROID NORM [phone]::"normal to inspection and palpation"} Breasts: {Exam; breast:13139::"normal appearance, no masses or tenderness"} Abdomen: soft, non-tender; non distended, no masses,  no organomegaly  Pelvic: External genitalia:  no lesions              Urethra:  normal appearing urethra with no masses, tenderness or lesions              Bartholins and Skenes: normal                 Vagina: normal appearing vagina with normal color and discharge, no lesions  Cervix: {CHL AMB PHY EX CERVIX NORM DEFAULT:[phone]::"no lesions"}              Bimanual Exam:  Uterus:  {CHL AMB PHY EX UTERUS NORM DEFAULT:[phone]::"normal size,  contour, position, consistency, mobility, non-tender"}              Adnexa: {CHL AMB PHY EX ADNEXA NO MASS [phone]::"no mass, fullness, tenderness"}              Rectovaginal: {yes no:314532}.  Confirms.              Anus:  normal sphincter tone, no lesions  Chaperone was present for exam.  ASSESSMENT     PLAN    An After Visit Summary was printed and given to the patient.  *** minutes face to face time of which over 50% was spent in counseling.

## 2019-05-07 ENCOUNTER — Ambulatory Visit: Payer: Self-pay | Admitting: Obstetrics and Gynecology

## 2019-05-19 ENCOUNTER — Other Ambulatory Visit: Payer: Self-pay

## 2019-05-19 ENCOUNTER — Encounter: Payer: Self-pay | Admitting: Obstetrics and Gynecology

## 2019-05-19 ENCOUNTER — Ambulatory Visit (INDEPENDENT_AMBULATORY_CARE_PROVIDER_SITE_OTHER): Payer: Medicare Other | Admitting: Obstetrics and Gynecology

## 2019-05-19 VITALS — BP 132/78 | HR 84 | Temp 98.2°F | Wt 159.8 lb

## 2019-05-19 DIAGNOSIS — N898 Other specified noninflammatory disorders of vagina: Secondary | ICD-10-CM | POA: Diagnosis not present

## 2019-05-19 DIAGNOSIS — N952 Postmenopausal atrophic vaginitis: Secondary | ICD-10-CM

## 2019-05-19 DIAGNOSIS — N941 Unspecified dyspareunia: Secondary | ICD-10-CM

## 2019-05-19 MED ORDER — ESTRADIOL 10 MCG VA TABS: ORAL_TABLET | VAGINAL | 0 refills | Status: DC

## 2019-05-19 NOTE — Progress Notes (Signed)
GYNECOLOGY  VISIT   HPI: 68 y.o.   Widowed White or Caucasian Not Hispanic or Latino  female   G1P0010 with Patient's last menstrual period was 08/29/2002 (approximate).   here for follow up on vaginal estrogen.  She was seen last month after starting a new sexual relationship with dyspareunia and postcoital spotting (suspected tearing). She was noted to have atrophy on exam and was started on vaginal estrogen and instructed to use lubrication with intercourse.  She hasn't been sexually active in the last several weeks, has only tried one time and unable to penetrate secondary to ED. She does notice a slight increase in yellow vaginal discharge, no odor, no irritation. No vaginal bleeding  GYNECOLOGIC HISTORY: Patient's last menstrual period was 08/29/2002 (approximate). Contraception: Postmenopausal Menopausal hormone therapy: Estradiol vaginal tablets        OB History    Gravida  1   Para  0   Term  0   Preterm  0   AB  1   Living  0     SAB  0   TAB  1   Ectopic  0   Multiple  0   Live Births  0              Patient Active Problem List   Diagnosis Date Noted  . Major depressive disorder, recurrent episode, mild ([place]) 08/24/2018  . Major depressive disorder, recurrent episode, in full remission ([place]) 07/14/2018  . Pain in joint of right elbow 12/04/2017  . Osteopenia 02/11/2014  . Benign paroxysmal positional vertigo 02/11/2014  . Melanoma ([place]) 03/31/2012  . History of depression 03/31/2012  . Elevated blood pressure 03/31/2012    Past Medical History:  Diagnosis Date  . Cancer (HCC)    right leg, upper thigh AND CALF melanoma  . Depression   . GERD (gastroesophageal reflux disease)   . Hypertension    Patient denies  . Melanoma ([place])   . Osteopenia   . UTI (lower urinary tract infection)     Past Surgical History:  Procedure Laterality Date  . BREAST BIOPSY Left   . COLONOSCOPY    . MELANOMA EXCISION  2006   right thigh and calf  .  REFRACTIVE SURGERY    . TONSILLECTOMY    . WISDOM TOOTH EXTRACTION      Current Outpatient Medications  Medication Sig Dispense Refill  . alendronate (FOSAMAX) 70 MG tablet Take 1 tablet (70 mg total) by mouth every 7 (seven) days. Take with a full glass of water on an empty stomach. 12 tablet 3  . buPROPion (WELLBUTRIN XL) 150 MG 24 hr tablet Take 1 tablet (150 mg total) by mouth daily. 90 tablet 1  . Cholecalciferol (VITAMIN D3) 125 MCG (5000 UT) CAPS     . DULoxetine (CYMBALTA) 30 MG capsule Take with 60 mg capsule to equal total daily dose of 90 mg 90 capsule 1  . DULoxetine (CYMBALTA) 60 MG capsule Take one capsule with 30 mg capsule to equal total dose of 90 mg daily 90 capsule 1  . Estradiol 10 MCG TABS vaginal tablet Use one tablet qhs x 1 week, then space to 1 tablet at hs 2 x a week. 24 tablet 0  . Multiple Vitamin (MULTIVITAMIN) capsule Take 1 capsule by mouth daily. Juice Plus    . Omega-3 Fatty Acids (FISH OIL) 1000 MG CAPS Take by mouth.    . Probiotic Product (PROBIOTIC DAILY PO) Take by mouth.    . promethazine (  PHENERGAN) 12.5 MG tablet Take 1 tablet (12.5 mg total) by mouth every 6 (six) hours as needed for nausea or vomiting. 30 tablet 0  . traZODone (DESYREL) 50 MG tablet TAKE 1 TAB BY MOUTH AT BEDTIME AS NEEDED FOR SLEEP. 1/2 TAB AT BEDTIME AS NEEDED 90 tablet 1   No current facility-administered medications for this visit.      ALLERGIES: Keflex [cephalexin] and Sulfa antibiotics  Family History  Problem Relation Age of Onset  . Cancer Mother        breast  . Stroke Mother   . Breast cancer Mother   . Depression Mother   . Alcohol abuse Father   . Heart attack Father   . Other Sister 22       colon mass, benign  . Hypertension Sister   . Colon cancer Sister 54  . Depression Sister   . Colon polyps Sister   . Prostate cancer Brother   . Other Sister 6       colon mass- not colon cancer  . Hypertension Sister   . Colon polyps Sister   . Anxiety  disorder Maternal Aunt   . Esophageal cancer Neg Hx   . Rectal cancer Neg Hx   . Stomach cancer Neg Hx     Social History   Socioeconomic History  . Marital status: Widowed    Spouse name: Not on file  . Number of children: Not on file  . Years of education: Not on file  . Highest education level: Not on file  Occupational History  . Not on file  Social Needs  . Financial resource strain: Not on file  . Food insecurity    Worry: Not on file    Inability: Not on file  . Transportation needs    Medical: Not on file    Non-medical: Not on file  Tobacco Use  . Smoking status: Never Smoker  . Smokeless tobacco: Never Used  Substance and Sexual Activity  . Alcohol use: Yes    Alcohol/week: 1.0 - 2.0 standard drinks    Types: 1 - 2 Glasses of wine per week  . Drug use: No  . Sexual activity: Yes    Birth control/protection: Post-menopausal  Lifestyle  . Physical activity    Days per week: Not on file    Minutes per session: Not on file  . Stress: Not on file  Relationships  . Social Herbalist on phone: Not on file    Gets together: Not on file    Attends religious service: Not on file    Active member of club or organization: Not on file    Attends meetings of clubs or organizations: Not on file    Relationship status: Not on file  . Intimate partner violence    Fear of current or ex partner: Not on file    Emotionally abused: Not on file    Physically abused: Not on file    Forced sexual activity: Not on file  Other Topics Concern  . Not on file  Social History Narrative  . Not on file    Review of Systems  Constitutional: Negative.   HENT: Negative.   Eyes: Negative.   Respiratory: Negative.   Cardiovascular: Negative.   Gastrointestinal: Negative.   Genitourinary:       Vaginal discharge  Musculoskeletal: Negative.   Skin: Negative.   Neurological: Negative.   Endo/Heme/Allergies: Negative.   Psychiatric/Behavioral: Negative.  PHYSICAL EXAMINATION:    BP 132/78 (BP Location: Right Arm, Patient Position: Sitting, Cuff Size: Normal)   Pulse 84   Temp 98.2 F (36.8 C) (Skin)   Wt 159 lb 12.8 oz (72.5 kg)   LMP 08/29/2002 (Approximate)   BMI 25.79 kg/m     General appearance: alert, cooperative and appears stated age   Pelvic: External genitalia:  no lesions              Urethra:  normal appearing urethra with no masses, tenderness or lesions              Bartholins and Skenes: normal                 Vagina: atrophic appearing vagina (slight improvement) with mild erythema and an increase in yellow watery vaginal discharge. Able to insert one finger without discomfort, only able to insert 2 fingers part way before it was uncomfortable.               Cervix: no lesions                             Chaperone was present for exam.  ASSESSMENT Vaginal atrophy Tight vaginal opening H/O dyspareunia, hasn't had penetration since her last visit Vaginal discharge and erythema     PLAN Continue with vaginal estrogen Affirm sent Discussed vaginal dilators (recommended she order them on line) Use lubrication if she attempts penetration F/U in 10/20 for annual exam and f/u Call with any further bleeding (unless she knows she has a tear)

## 2019-05-20 LAB — VAGINITIS/VAGINOSIS, DNA PROBE
Candida Species: NEGATIVE
Gardnerella vaginalis: NEGATIVE
Trichomonas vaginosis: NEGATIVE

## 2019-06-05 ENCOUNTER — Encounter: Payer: Medicare Other | Attending: Family Medicine | Admitting: Registered"

## 2019-06-05 DIAGNOSIS — R7303 Prediabetes: Secondary | ICD-10-CM | POA: Diagnosis not present

## 2019-06-10 ENCOUNTER — Encounter: Payer: Self-pay | Admitting: Registered"

## 2019-06-10 NOTE — Progress Notes (Signed)
On 06/05/2019 patient completed Core Session 1 of Diabetes Prevention Program course virtually with [facility]. The following learning objectives were met by the patient during this class:   Learning Objectives:  Be able to explain the purpose and benefits of the National Diabetes Prevention Program.   Be able to describe the events that will take place at every session.   Know the weight loss and physical activity goals established by the National Diabetes Prevention Program.   Know their own individual weight loss and physical activity goals.   Be able to explain the important effect of self-monitoring on behavior change.   Goals:   Record food and beverage intake in "Food and Activity Tracker" over the next week.   E-mail completed "Food and Activity Tracker" to Lifestyle Coach next week before session 2.  Circle the foods or beverages you think are highest in fat and calories in your food tracker.  Read the labels on the food you buy, and consider using measuring cups and spoons to help you calculate the amount you eat. We will talk about measuring in more detail in the coming weeks.   Follow-Up Plan:  Attend Core Session 2 next week.   E-mail completed "Food and Activity Tracker" to Lifestyle Coach next week before class  

## 2019-06-12 ENCOUNTER — Encounter (HOSPITAL_BASED_OUTPATIENT_CLINIC_OR_DEPARTMENT_OTHER): Payer: Medicare Other | Admitting: Registered"

## 2019-06-12 ENCOUNTER — Encounter: Payer: Self-pay | Admitting: Registered"

## 2019-06-12 DIAGNOSIS — R7303 Prediabetes: Secondary | ICD-10-CM

## 2019-06-12 NOTE — Progress Notes (Signed)
On 06/12/2019 patient completed Core Session 2 of Diabetes Prevention Program course virtually with [facility]. The following learning objectives were met by the patient during this class:   Learning Objectives:  Self-monitor their weight during the weeks following Session 2.   Describe the relationship between fat and calories.   Explain the reason for, and basic principles of, self-monitoring fat grams and calories.   Identify their personal fat gram goals.   Use the ?Fat and Calorie Counter to calculate the calories and fat grams of a given selection of foods.   Keep a running total of the fat grams they eat each day.   Calculate fat, calories, and serving sizes from nutrition labels.   Goals:   Weigh yourself at the same time each day, or every few days, and record your weight in your Food and Activity Tracker.  Write down everything you eat and drink in your Food and Activity Tracker.  Measure portions as much as you can, and start reading labels.   Use the ?Fat and Calorie Counter to figure out the amount of fat and calories in what you ate, and write the amount down in your Food and Activity Tracker.  Keep a running fat gram total throughout the day. Come as close to your fat gram goal as you can.   Follow-Up Plan:  Attend Core Session 3 next week.   Email completed  "Food and Activity Tracker" to Lifestyle Coach next week.

## 2019-06-16 ENCOUNTER — Other Ambulatory Visit: Payer: Self-pay | Admitting: Obstetrics and Gynecology

## 2019-06-16 NOTE — Telephone Encounter (Signed)
Medication refill request: Yuvafem 10 mcg Last AEX:  08/06/2018 Next AEX: 08/12/2019 Last MMG (if hormonal medication request): 09/01/2018 Birads 3 probably benign Refill authorized: Yuvafem 10 mcg #24 0RF pending

## 2019-06-19 ENCOUNTER — Ambulatory Visit (HOSPITAL_BASED_OUTPATIENT_CLINIC_OR_DEPARTMENT_OTHER): Payer: Medicare Other | Admitting: Registered"

## 2019-06-19 DIAGNOSIS — R7303 Prediabetes: Secondary | ICD-10-CM

## 2019-06-25 ENCOUNTER — Encounter: Payer: Self-pay | Admitting: Registered"

## 2019-06-25 NOTE — Progress Notes (Signed)
On 06/19/2019 patient completed Core Session 3 of Diabetes Prevention Program course virtually with [facility]. The following learning objectives were met by the patient during this class:    Learning Objectives:  Weigh and measure foods.  Estimate the fat and calorie content of common foods.  Describe three ways to eat less fat and fewer calories.  Create a plan to eat less fat for the following week.   Goals:   Track weight when weighing outside of class.   Track food and beverages eaten each day in Food and Activity Tracker and include fat grams and calories for each.   Try to stay within fat gram goal.   Complete plan for eating less high fat foods and answer related homework questions.    Follow-Up Plan:  Attend Core Session 4 next week.   Bring completed "Food and Activity Tracker" next week to be reviewed by Lifestyle Coach.

## 2019-06-26 ENCOUNTER — Encounter: Payer: Medicare Other | Admitting: Registered"

## 2019-07-03 ENCOUNTER — Encounter: Payer: Medicare Other | Admitting: Registered"

## 2019-07-10 ENCOUNTER — Encounter: Payer: Medicare Other | Attending: Family Medicine | Admitting: Registered"

## 2019-07-10 DIAGNOSIS — R7303 Prediabetes: Secondary | ICD-10-CM | POA: Diagnosis not present

## 2019-07-15 ENCOUNTER — Encounter: Payer: Self-pay | Admitting: Registered"

## 2019-07-15 NOTE — Progress Notes (Signed)
On 07/10/2019 patient completed Core Session 6 of Diabetes Prevention Program course virtually with [facility]. The following learning objectives were met by the patient during this class:   Learning Objectives:  Graph their daily physical activity.   Describe two ways of finding the time to be active.   Define "lifestyle activity."   Describe how to prevent injury.   Develop an activity plan for the coming week.   Goals:   Record weight taken outside of class.   Track foods and beverages eaten each day in the "Food and Activity Tracker," including calories and fat grams for each item.    Track activity type, minutes you were active, and distance you reached each day in the "Food and Activity Tracker."   Set aside one 20 to 30-minute block of time every day or find two or more periods of 10 to15 minutes each for physical activity.   Warm up, cool down, and stretch.  Make a Physical Activities Plan for the Week.   Follow-Up Plan:  Attend Core Session 7 next week.   E-mail completed "Food and Activity Tracker" to Lifestyle Coach next week before class  

## 2019-07-17 ENCOUNTER — Encounter: Payer: Medicare Other | Admitting: Registered"

## 2019-07-24 ENCOUNTER — Encounter: Payer: Medicare Other | Admitting: Registered"

## 2019-07-31 ENCOUNTER — Encounter: Payer: Medicare Other | Admitting: Registered"

## 2019-08-10 NOTE — Progress Notes (Deleted)
68 y.o. G1P0010 Widowed White or Caucasian Not Hispanic or Latino female here for annual exam.      Patient's last menstrual period was 08/29/2002 (approximate).          Sexually active: {yes no:314532}  The current method of family planning is {contraception:315051}.    Exercising: {yes no:314532}  {types:19826} Smoker:  {YES NO:22349}  Health Maintenance: Pap:07/18/2017 normal with neg HR HPV, 02/11/14 Neg. HR HPV:neg   History of abnormal Pap:no MMG: 02/27/2018 Birads 3 probably benign, follow up scheduled for 09/01/2018 Colonoscopy:01/12/2019 polyp BMD:08/14/2017 Osteopenia, T score -2.3. FRAX 21/4% TDaP:02/11/14   reports that she has never smoked. She has never used smokeless tobacco. She reports current alcohol use of about 1.0 - 2.0 standard drinks of alcohol per week. She reports that she does not use drugs.  Past Medical History:  Diagnosis Date  . Cancer (HCC)    right leg, upper thigh AND CALF melanoma  . Depression   . GERD (gastroesophageal reflux disease)   . Hypertension    Patient denies  . Melanoma ([place])   . Osteopenia   . UTI (lower urinary tract infection)     Past Surgical History:  Procedure Laterality Date  . BREAST BIOPSY Left   . COLONOSCOPY    . MELANOMA EXCISION  2006   right thigh and calf  . REFRACTIVE SURGERY    . TONSILLECTOMY    . WISDOM TOOTH EXTRACTION      Current Outpatient Medications  Medication Sig Dispense Refill  . alendronate (FOSAMAX) 70 MG tablet Take 1 tablet (70 mg total) by mouth every 7 (seven) days. Take with a full glass of water on an empty stomach. 12 tablet 3  . buPROPion (WELLBUTRIN XL) 150 MG 24 hr tablet Take 1 tablet (150 mg total) by mouth daily. 90 tablet 1  . Cholecalciferol (VITAMIN D3) 125 MCG (5000 UT) CAPS     . DULoxetine (CYMBALTA) 30 MG capsule Take with 60 mg capsule to equal total daily dose of 90 mg 90 capsule 1  . DULoxetine (CYMBALTA) 60 MG capsule Take one capsule with 30 mg capsule to equal  total dose of 90 mg daily 90 capsule 1  . Estradiol (YUVAFEM) 10 MCG TABS vaginal tablet Place 1 tablet vaginally twice weekly. 24 tablet 0  . Multiple Vitamin (MULTIVITAMIN) capsule Take 1 capsule by mouth daily. Juice Plus    . Omega-3 Fatty Acids (FISH OIL) 1000 MG CAPS Take by mouth.    . Probiotic Product (PROBIOTIC DAILY PO) Take by mouth.    . promethazine (PHENERGAN) 12.5 MG tablet Take 1 tablet (12.5 mg total) by mouth every 6 (six) hours as needed for nausea or vomiting. 30 tablet 0  . traZODone (DESYREL) 50 MG tablet TAKE 1 TAB BY MOUTH AT BEDTIME AS NEEDED FOR SLEEP. 1/2 TAB AT BEDTIME AS NEEDED 90 tablet 1   No current facility-administered medications for this visit.     Family History  Problem Relation Age of Onset  . Cancer Mother        breast  . Stroke Mother   . Breast cancer Mother   . Depression Mother   . Alcohol abuse Father   . Heart attack Father   . Other Sister 70       colon mass, benign  . Hypertension Sister   . Colon cancer Sister 35  . Depression Sister   . Colon polyps Sister   . Prostate cancer Brother   . Other Sister 22  colon mass- not colon cancer  . Hypertension Sister   . Colon polyps Sister   . Anxiety disorder Maternal Aunt   . Esophageal cancer Neg Hx   . Rectal cancer Neg Hx   . Stomach cancer Neg Hx     Review of Systems  Exam:   LMP 08/29/2002 (Approximate)   Weight change: @WEIGHTCHANGE @ Height:      Ht Readings from Last 3 Encounters:  01/12/19 5\' 6"  (1.676 m)  12/30/18 5' 6.5" (1.689 m)  12/26/18 5' 5.35" (1.66 m)    General appearance: alert, cooperative and appears stated age Head: Normocephalic, without obvious abnormality, atraumatic Neck: no adenopathy, supple, symmetrical, trachea midline and thyroid {CHL AMB PHY EX THYROID NORM DEFAULT:[phone]::"normal to inspection and palpation"} Lungs: clear to auscultation bilaterally Cardiovascular: regular rate and rhythm Breasts: {Exam; breast:13139::"normal  appearance, no masses or tenderness"} Abdomen: soft, non-tender; non distended,  no masses,  no organomegaly Extremities: extremities normal, atraumatic, no cyanosis or edema Skin: Skin color, texture, turgor normal. No rashes or lesions Lymph nodes: Cervical, supraclavicular, and axillary nodes normal. No abnormal inguinal nodes palpated Neurologic: Grossly normal   Pelvic: External genitalia:  no lesions              Urethra:  normal appearing urethra with no masses, tenderness or lesions              Bartholins and Skenes: normal                 Vagina: normal appearing vagina with normal color and discharge, no lesions              Cervix: {CHL AMB PHY EX CERVIX NORM [phone]::"no lesions"}               Bimanual Exam:  Uterus:  {CHL AMB PHY EX UTERUS NORM DEFAULT:[id number]::"normal size, contour, position, consistency, mobility, non-tender"}              Adnexa: {CHL AMB PHY EX ADNEXA NO MASS DEFAULT:[phone]::"no mass, fullness, tenderness"}               Rectovaginal: Confirms               Anus:  normal sphincter tone, no lesions  Chaperone was present for exam.  A:  Well Woman with normal exam  P:

## 2019-08-12 ENCOUNTER — Ambulatory Visit: Payer: Medicare Other | Admitting: Obstetrics and Gynecology

## 2019-08-16 ENCOUNTER — Other Ambulatory Visit: Payer: Self-pay | Admitting: Psychiatry

## 2019-08-16 DIAGNOSIS — F5101 Primary insomnia: Secondary | ICD-10-CM

## 2019-09-03 ENCOUNTER — Ambulatory Visit (INDEPENDENT_AMBULATORY_CARE_PROVIDER_SITE_OTHER): Payer: Medicare Other | Admitting: Psychiatry

## 2019-09-03 ENCOUNTER — Other Ambulatory Visit: Payer: Self-pay

## 2019-09-03 ENCOUNTER — Encounter: Payer: Self-pay | Admitting: Psychiatry

## 2019-09-03 DIAGNOSIS — F3342 Major depressive disorder, recurrent, in full remission: Secondary | ICD-10-CM

## 2019-09-03 DIAGNOSIS — F5101 Primary insomnia: Secondary | ICD-10-CM | POA: Diagnosis not present

## 2019-09-03 MED ORDER — DULOXETINE HCL 60 MG PO CPEP: ORAL_CAPSULE | ORAL | 1 refills | Status: DC

## 2019-09-03 MED ORDER — DULOXETINE HCL 30 MG PO CPEP: ORAL_CAPSULE | ORAL | 1 refills | Status: DC

## 2019-09-03 MED ORDER — BUPROPION HCL ER (XL) 150 MG PO TB24: 150.0000 mg | ORAL_TABLET | Freq: Every day | ORAL | 1 refills | Status: DC

## 2019-09-03 NOTE — Progress Notes (Signed)
Lori Harvey [id number] [birth year] 68 y.o.  Virtual Visit via Telephone Note  I connected with pt on 09/03/19 at 11:00 AM EST by telephone and verified that I am speaking with the correct person using two identifiers.   I discussed the limitations, risks, security and privacy concerns of performing an evaluation and management service by telephone and the availability of in person appointments. I also discussed with the patient that there may be a patient responsible charge related to this service. The patient expressed understanding and agreed to proceed.   I discussed the assessment and treatment plan with the patient. The patient was provided an opportunity to ask questions and all were answered. The patient agreed with the plan and demonstrated an understanding of the instructions.   The patient was advised to call back or seek an in-person evaluation if the symptoms worsen or if the condition fails to improve as anticipated.  I provided 30 minutes of non-face-to-face time during this encounter.  The patient was located at home.  The provider was located at [facility].   Thayer Headings, PMHNP   Subjective:   Patient ID:  Lori Harvey is a 68 y.o. (DOB Mar 26, 1951) female.  Chief Complaint:  Chief Complaint  Patient presents with  . Follow-up    Depression, Anxiety    HPI Lori Harvey presents for follow-up of mood and anxiety. She reports that her mood remains good and denies current depressed mood. Denies significant anxiety despite circumstances that could trigger anxiety. She reports that her sleep is "ok." She reports that Trazodone seems to help her get a deeper sleep and not necessarily longer duration of sleep. She reports that she takes Trazodone prn when she has had several nights of poor sleep. Appetite has been good and reports that she would like to lose weight. She reports that overall her energy and motivation have been good. She reports that she would  like to start exercising more. She reports improved concentration. Denies SI.   One sister died and her other sister later had surgery and pt was her caregiver. Another friend had a hip replacement and pt was also caregiver for her.   Reports that her family in [place] all had COVID and have since recovered. Has also known several people that have had COVID. Reports that she has a good support network.   Past Psychiatric Medication Trials: Sertraline Lexapro Celexa Cymbalta Wellbutrin Ambien Valium Trazodone  Review of Systems:  Review of Systems  Musculoskeletal: Negative for gait problem.  Neurological:       Occ vertigo and reports that it has been better managed compared to the past.   Psychiatric/Behavioral:       Please refer to HPI    Medications: I have reviewed the patient's current medications.  Current Outpatient Medications  Medication Sig Dispense Refill  . alendronate (FOSAMAX) 70 MG tablet Take 1 tablet (70 mg total) by mouth every 7 (seven) days. Take with a full glass of water on an empty stomach. 12 tablet 3  . B Complex-C (SUPER B COMPLEX PO) Take by mouth.    Marland Kitchen buPROPion (WELLBUTRIN XL) 150 MG 24 hr tablet Take 1 tablet (150 mg total) by mouth daily. 90 tablet 1  . Cholecalciferol (VITAMIN D3) 125 MCG (5000 UT) CAPS     . DULoxetine (CYMBALTA) 30 MG capsule Take with 60 mg capsule to equal total daily dose of 90 mg 90 capsule 1  . DULoxetine (CYMBALTA) 60 MG capsule Take one capsule with 30  mg capsule to equal total dose of 90 mg daily 90 capsule 1  . Estradiol (YUVAFEM) 10 MCG TABS vaginal tablet Place 1 tablet vaginally twice weekly. 24 tablet 0  . Multiple Vitamin (MULTIVITAMIN) capsule Take 1 capsule by mouth daily. Juice Plus    . Omega-3 Fatty Acids (FISH OIL) 1000 MG CAPS Take by mouth.    . Probiotic Product (PROBIOTIC DAILY PO) Take by mouth.    . traZODone (DESYREL) 50 MG tablet Take 1 tablet (50 mg total) by mouth at bedtime as needed for sleep.  Take 1/2-1 tablet by mouth at bedtime as needed for sleep 90 tablet 1  . promethazine (PHENERGAN) 12.5 MG tablet Take 1 tablet (12.5 mg total) by mouth every 6 (six) hours as needed for nausea or vomiting. 30 tablet 0   No current facility-administered medications for this visit.     Medication Side Effects: Other: Possible dry mouth  Allergies:  Allergies  Allergen Reactions  . Keflex [Cephalexin]     Nausea, hives  . Sulfa Antibiotics Rash    Past Medical History:  Diagnosis Date  . Cancer (HCC)    right leg, upper thigh AND CALF melanoma  . Depression   . GERD (gastroesophageal reflux disease)   . Hypertension    Patient denies  . Melanoma ([place])   . Osteopenia   . UTI (lower urinary tract infection)     Family History  Problem Relation Age of Onset  . Cancer Mother        breast  . Stroke Mother   . Breast cancer Mother   . Depression Mother   . Alcohol abuse Father   . Heart attack Father   . Other Sister 58       colon mass, benign  . Hypertension Sister   . Colon cancer Sister 80  . Depression Sister   . Colon polyps Sister   . Prostate cancer Brother   . Other Sister 48       colon mass- not colon cancer  . Hypertension Sister   . Colon polyps Sister   . Anxiety disorder Maternal Aunt   . Esophageal cancer Neg Hx   . Rectal cancer Neg Hx   . Stomach cancer Neg Hx     Social History   Socioeconomic History  . Marital status: Widowed    Spouse name: Not on file  . Number of children: Not on file  . Years of education: Not on file  . Highest education level: Not on file  Occupational History  . Not on file  Social Needs  . Financial resource strain: Not on file  . Food insecurity    Worry: Not on file    Inability: Not on file  . Transportation needs    Medical: Not on file    Non-medical: Not on file  Tobacco Use  . Smoking status: Never Smoker  . Smokeless tobacco: Never Used  Substance and Sexual Activity  . Alcohol use: Yes     Alcohol/week: 1.0 - 2.0 standard drinks    Types: 1 - 2 Glasses of wine per week  . Drug use: No  . Sexual activity: Yes    Birth control/protection: Post-menopausal  Lifestyle  . Physical activity    Days per week: Not on file    Minutes per session: Not on file  . Stress: Not on file  Relationships  . Social Herbalist on phone: Not on file    Gets  together: Not on file    Attends religious service: Not on file    Active member of club or organization: Not on file    Attends meetings of clubs or organizations: Not on file    Relationship status: Not on file  . Intimate partner violence    Fear of current or ex partner: Not on file    Emotionally abused: Not on file    Physically abused: Not on file    Forced sexual activity: Not on file  Other Topics Concern  . Not on file  Social History Narrative  . Not on file    Past Medical History, Surgical history, Social history, and Family history were reviewed and updated as appropriate.   Please see review of systems for further details on the patient's review from today.   Objective:   Physical Exam:  LMP 08/29/2002 (Approximate)   Physical Exam Neurological:     Mental Status: She is alert and oriented to person, place, and time.     Cranial Nerves: No dysarthria.  Psychiatric:        Attention and Perception: Attention normal.        Mood and Affect: Mood normal.        Speech: Speech normal.        Behavior: Behavior is cooperative.        Thought Content: Thought content normal. Thought content is not paranoid or delusional. Thought content does not include homicidal or suicidal ideation. Thought content does not include homicidal or suicidal plan.        Cognition and Memory: Cognition and memory normal.        Judgment: Judgment normal.     Lab Review:     Component Value Date/Time   NA 134 (L) 01/08/2019 1125   NA 140 10/01/2018 0850   K 4.1 01/08/2019 1125   CL 98 01/08/2019 1125   CO2 28  01/08/2019 1125   GLUCOSE 104 (H) 01/08/2019 1125   BUN 17 01/08/2019 1125   BUN 20 10/01/2018 0850   CREATININE 0.94 01/08/2019 1125   CREATININE 0.87 07/16/2016 1455   CALCIUM 9.2 01/08/2019 1125   PROT 6.6 08/27/2018 0832   ALBUMIN 4.1 10/01/2018 0850   AST 22 08/27/2018 0832   ALT 13 08/27/2018 0832   ALKPHOS 69 08/27/2018 0832   BILITOT 0.5 08/27/2018 0832   GFRNONAA 56 (L) 10/01/2018 0850   GFRAA 64 10/01/2018 0850       Component Value Date/Time   WBC 7.1 08/27/2018 0832   WBC 7.7 07/16/2016 1455   RBC 4.88 08/27/2018 0832   RBC 4.63 07/16/2016 1455   HGB 13.5 08/27/2018 0832   HGB 13.7 02/11/2014 1304   HCT 41.4 08/27/2018 0832   PLT 318 08/27/2018 0832   MCV 85 08/27/2018 0832   MCH 27.7 08/27/2018 0832   MCH 28.1 07/16/2016 1455   MCHC 32.6 08/27/2018 0832   MCHC 33.2 07/16/2016 1455   RDW 13.2 08/27/2018 0832   LYMPHSABS 2,695 07/16/2016 1455   MONOABS 539 07/16/2016 1455   EOSABS 77 07/16/2016 1455   BASOSABS 0 07/16/2016 1455    No results found for: POCLITH, LITHIUM   No results found for: PHENYTOIN, PHENOBARB, VALPROATE, CBMZ   .res Assessment: Plan:   Patient seen for 30 minutes and greater than 50% of visit spent counseling patient and coronation of care, to include answering her questions about maintaining overall physical and mental health and questions about vitamins and supplements.  Patient  reports that overall her mood and anxiety are stable, particularly considering multiple stressors and other events.  Discussed continuing current medications at this time and then considering possible dose reductions in the future when there are less external stressors. Will continue Cymbalta 90 mg daily for depression and anxiety. Will continue trazodone as needed for insomnia. Continue Wellbutrin XL 150 mg daily for depression. Patient to follow-up in 6 months or sooner if clinically indicated. Patient advised to contact office with any questions, adverse  effects, or acute worsening in signs and symptoms.  Tameron was seen today for follow-up.  Diagnoses and all orders for this visit:  Major depressive disorder, recurrent episode, in full remission ([place]) -     DULoxetine (CYMBALTA) 30 MG capsule; Take with 60 mg capsule to equal total daily dose of 90 mg -     DULoxetine (CYMBALTA) 60 MG capsule; Take one capsule with 30 mg capsule to equal total dose of 90 mg daily -     buPROPion (WELLBUTRIN XL) 150 MG 24 hr tablet; Take 1 tablet (150 mg total) by mouth daily.    Please see After Visit Summary for patient specific instructions.  No future appointments.  No orders of the defined types were placed in this encounter.     -------------------------------

## 2019-09-04 ENCOUNTER — Other Ambulatory Visit: Payer: Self-pay | Admitting: Obstetrics and Gynecology

## 2019-09-04 NOTE — Telephone Encounter (Signed)
Medication refill request: Fosamax  Last AEX:  08-06-18 JJ  Next AEX: 09-08-2019 Last BMD: 08-14-17 osteopenia  Refill authorized: Today, please advise.   Spoke with patient and scheduled aex for 09-08-2019 at 1000. Patient agreeable to date and time of appointment. Patient states she is out of fosamax and will need refilled prior to appointment on Tuesday. Pharmacy confirmed.   Medication pended for #1, 0RF. Please refill if appropriate.

## 2019-09-07 ENCOUNTER — Other Ambulatory Visit: Payer: Self-pay

## 2019-09-07 NOTE — Progress Notes (Signed)
68 y.o. G1P0010 Widowed White or Caucasian Not Hispanic or Latino female here for annual exam.   The patient was seen in July with dyspareunia, vaginal atrophy, a tight introitus. She had been started on vaginal estrogen in 6/20. It was recommended that she try vaginal dilators. She isn't currently sexually active.  No vaginal bleeding. Still using the vaginal estrogen, not always 2 x a week.   H/O depression. Doing well.   One of her sisters died in 2023/02/27. Other sister had heart surgery.     Patient's last menstrual period was 08/29/2002 (approximate).          Sexually active: No.  The current method of family planning is post menopausal status.    Exercising: Yes.    walking Smoker:  no  Health Maintenance: Pap:07/18/2017 normal with neg HR HPV, 02/11/14 Neg. HR HPV:neg   History of abnormal Pap:no MMG: 09/01/2018 Birads 3 probably benign Colonoscopy:01/12/2019 polyp BMD:08/14/2017 Osteopenia, T score -2.3. FRAX 21/4% TDaP:02/11/14   reports that she has never smoked. She has never used smokeless tobacco. She reports current alcohol use of about 1.0 - 2.0 standard drinks of alcohol per week. She reports that she does not use drugs.  Past Medical History:  Diagnosis Date  . Cancer (HCC)    right leg, upper thigh AND CALF melanoma  . Depression   . GERD (gastroesophageal reflux disease)   . Hypertension    Patient denies  . Melanoma ([place])   . Osteopenia   . Prediabetes   . UTI (lower urinary tract infection)     Past Surgical History:  Procedure Laterality Date  . BREAST BIOPSY Left   . COLONOSCOPY    . MELANOMA EXCISION  2006   right thigh and calf  . REFRACTIVE SURGERY    . TONSILLECTOMY    . WISDOM TOOTH EXTRACTION      Current Outpatient Medications  Medication Sig Dispense Refill  . alendronate (FOSAMAX) 70 MG tablet TAKE 1 TABLET BY MOUTH EVERY 7 (SEVEN) DAYS. TAKE WITH A FULL GLASS OF WATER ON AN EMPTY STOMACH. 12 tablet 0  . Ascorbic Acid (VITAMIN C  PO) Take 1 tablet by mouth daily. Vitamin C with Zinc    . B Complex-C (SUPER B COMPLEX PO) Take by mouth.    Marland Kitchen buPROPion (WELLBUTRIN XL) 150 MG 24 hr tablet Take 1 tablet (150 mg total) by mouth daily. 90 tablet 1  . Cholecalciferol (VITAMIN D3) 125 MCG (5000 UT) CAPS     . DULoxetine (CYMBALTA) 30 MG capsule Take with 60 mg capsule to equal total daily dose of 90 mg 90 capsule 1  . DULoxetine (CYMBALTA) 60 MG capsule Take one capsule with 30 mg capsule to equal total dose of 90 mg daily 90 capsule 1  . Estradiol (YUVAFEM) 10 MCG TABS vaginal tablet Place 1 tablet vaginally twice weekly. 24 tablet 0  . Multiple Vitamin (MULTIVITAMIN) capsule Take 1 capsule by mouth daily. Juice Plus    . Omega-3 Fatty Acids (FISH OIL) 1000 MG CAPS Take by mouth.    . Probiotic Product (PROBIOTIC DAILY PO) Take by mouth.    . promethazine (PHENERGAN) 12.5 MG tablet Take 1 tablet (12.5 mg total) by mouth every 6 (six) hours as needed for nausea or vomiting. 30 tablet 0  . traZODone (DESYREL) 50 MG tablet Take 1 tablet (50 mg total) by mouth at bedtime as needed for sleep. Take 1/2-1 tablet by mouth at bedtime as needed for sleep 90 tablet 1  No current facility-administered medications for this visit.     Family History  Problem Relation Age of Onset  . Cancer Mother        breast  . Stroke Mother   . Breast cancer Mother   . Depression Mother   . Alcohol abuse Father   . Heart attack Father   . Other Sister 15       colon mass, benign  . Hypertension Sister   . Colon cancer Sister 47  . Depression Sister   . Colon polyps Sister   . Prostate cancer Brother   . Other Sister 40       colon mass- not colon cancer  . Hypertension Sister   . Colon polyps Sister   . Anxiety disorder Maternal Aunt   . Esophageal cancer Neg Hx   . Rectal cancer Neg Hx   . Stomach cancer Neg Hx     Review of Systems  Constitutional: Negative.   HENT: Negative.   Eyes: Negative.   Respiratory: Negative.    Cardiovascular: Negative.   Gastrointestinal: Negative.   Endocrine: Negative.   Genitourinary: Negative.   Musculoskeletal: Negative.   Skin: Negative.   Allergic/Immunologic: Negative.   Neurological: Negative.   Hematological: Negative.   Psychiatric/Behavioral: Negative.     Exam:   BP (!) 150/98 (BP Location: Right Arm, Patient Position: Sitting, Cuff Size: Normal)   Pulse 80   Temp (!) 97.1 F (36.2 C) (Skin)   Ht 5' 5.35" (1.66 m)   Wt 162 lb 9.6 oz (73.8 kg)   LMP 08/29/2002 (Approximate)   BMI 26.77 kg/m   Weight change: @WEIGHTCHANGE @ Height:   Height: 5' 5.35" (166 cm)  Ht Readings from Last 3 Encounters:  09/08/19 5' 5.35" (1.66 m)  01/12/19 5\' 6"  (1.676 m)  12/30/18 5' 6.5" (1.689 m)    General appearance: alert, cooperative and appears stated age Head: Normocephalic, without obvious abnormality, atraumatic Neck: no adenopathy, supple, symmetrical, trachea midline and thyroid normal to inspection and palpation Lungs: clear to auscultation bilaterally Cardiovascular: regular rate and rhythm Breasts: normal appearance, no masses or tenderness Abdomen: soft, non-tender; non distended,  no masses,  no organomegaly Extremities: extremities normal, atraumatic, no cyanosis or edema Skin: Skin color, texture, turgor normal. No rashes or lesions Lymph nodes: Cervical, supraclavicular, and axillary nodes normal. No abnormal inguinal nodes palpated Neurologic: Grossly normal   Pelvic: External genitalia:  no lesions              Urethra:  normal appearing urethra with no masses, tenderness or lesions              Bartholins and Skenes: normal                 Vagina: atrophic appearing vagina with normal color and discharge, no lesions              Cervix: no lesions               Bimanual Exam:  Uterus:  normal size, contour, position, consistency, mobility, non-tender              Adnexa: no mass, fullness, tenderness               Rectovaginal: Confirms                Anus:  normal sphincter tone, no lesions  Chaperone was present for exam.  A:  Well Woman with normal exam  Prediabetic  Osteopenia  with elevated risk of fracture  Vaginal atrophy, not sexually active, doesn't need estrogen.   P:   No pap   Mammogram due  DEXA due  Continue fosamax  Discussed breast self exam  Discussed calcium and vit D intake  Screening labs, vit d, [id number]

## 2019-09-08 ENCOUNTER — Encounter: Payer: Self-pay | Admitting: Obstetrics and Gynecology

## 2019-09-08 ENCOUNTER — Ambulatory Visit (INDEPENDENT_AMBULATORY_CARE_PROVIDER_SITE_OTHER): Payer: Medicare Other | Admitting: Obstetrics and Gynecology

## 2019-09-08 VITALS — BP 150/98 | HR 80 | Temp 97.1°F | Ht 65.35 in | Wt 162.6 lb

## 2019-09-08 DIAGNOSIS — R7303 Prediabetes: Secondary | ICD-10-CM

## 2019-09-08 DIAGNOSIS — Z0000 Encounter for general adult medical examination without abnormal findings: Secondary | ICD-10-CM | POA: Diagnosis not present

## 2019-09-08 DIAGNOSIS — Z01419 Encounter for gynecological examination (general) (routine) without abnormal findings: Secondary | ICD-10-CM | POA: Diagnosis not present

## 2019-09-08 DIAGNOSIS — M8580 Other specified disorders of bone density and structure, unspecified site: Secondary | ICD-10-CM

## 2019-09-08 MED ORDER — ALENDRONATE SODIUM 70 MG PO TABS: ORAL_TABLET | ORAL | 3 refills | Status: DC

## 2019-09-08 NOTE — Patient Instructions (Addendum)
You can try Replens, Vaginal Moisturizer, if needed.  EXERCISE AND DIET:  We recommended that you start or continue a regular exercise program for good health. Regular exercise means any activity that makes your heart beat faster and makes you sweat.  We recommend exercising at least 30 minutes per day at least 3 days a week, preferably 4 or 5.  We also recommend a diet low in fat and sugar.  Inactivity, poor dietary choices and obesity can cause diabetes, heart attack, stroke, and kidney damage, among others.    ALCOHOL AND SMOKING:  Women should limit their alcohol intake to no more than 7 drinks/beers/glasses of wine (combined, not each!) per week. Moderation of alcohol intake to this level decreases your risk of breast cancer and liver damage. And of course, no recreational drugs are part of a healthy lifestyle.  And absolutely no smoking or even second hand smoke. Most people know smoking can cause heart and lung diseases, but did you know it also contributes to weakening of your bones? Aging of your skin?  Yellowing of your teeth and nails?  CALCIUM AND VITAMIN D:  Adequate intake of calcium and Vitamin D are recommended.  The recommendations for exact amounts of these supplements seem to change often, but generally speaking 1,200 mg of calcium (between diet and supplement) and 800 units of Vitamin D per day seems prudent. Certain women may benefit from higher intake of Vitamin D.  If you are among these women, your doctor will have told you during your visit.    PAP SMEARS:  Pap smears, to check for cervical cancer or precancers,  have traditionally been done yearly, although recent scientific advances have shown that most women can have pap smears less often.  However, every woman still should have a physical exam from her gynecologist every year. It will include a breast check, inspection of the vulva and vagina to check for abnormal growths or skin changes, a visual exam of the cervix, and then an  exam to evaluate the size and shape of the uterus and ovaries.  And after 70 years of age, a rectal exam is indicated to check for rectal cancers. We will also provide age appropriate advice regarding health maintenance, like when you should have certain vaccines, screening for sexually transmitted diseases, bone density testing, colonoscopy, mammograms, etc.   MAMMOGRAMS:  All women over 61 years old should have a yearly mammogram. Many facilities now offer a "3D" mammogram, which may cost around $50 extra out of pocket. If possible,  we recommend you accept the option to have the 3D mammogram performed.  It both reduces the number of women who will be called back for extra views which then turn out to be normal, and it is better than the routine mammogram at detecting truly abnormal areas.    COLON CANCER SCREENING: Now recommend starting at age 34. At this time colonoscopy is not covered for routine screening until 50. There are take home tests that can be done between 45-49.   COLONOSCOPY:  Colonoscopy to screen for colon cancer is recommended for all women at age 50.  We know, you hate the idea of the prep.  We agree, BUT, having colon cancer and not knowing it is worse!!  Colon cancer so often starts as a polyp that can be seen and removed at colonscopy, which can quite literally save your life!  And if your first colonoscopy is normal and you have no family history of colon cancer,  most women don't have to have it again for 10 years.  Once every ten years, you can do something that may end up saving your life, right?  We will be happy to help you get it scheduled when you are ready.  Be sure to check your insurance coverage so you understand how much it will cost.  It may be covered as a preventative service at no cost, but you should check your particular policy.      Breast Self-Awareness Breast self-awareness means being familiar with how your breasts look and feel. It involves checking your  breasts regularly and reporting any changes to your health care provider. Practicing breast self-awareness is important. A change in your breasts can be a sign of a serious medical problem. Being familiar with how your breasts look and feel allows you to find any problems early, when treatment is more likely to be successful. All women should practice breast self-awareness, including women who have had breast implants. How to do a breast self-exam One way to learn what is normal for your breasts and whether your breasts are changing is to do a breast self-exam. To do a breast self-exam: Look for Changes  1. Remove all the clothing above your waist. 2. Stand in front of a mirror in a room with good lighting. 3. Put your hands on your hips. 4. Push your hands firmly downward. 5. Compare your breasts in the mirror. Look for differences between them (asymmetry), such as: ? Differences in shape. ? Differences in size. ? Puckers, dips, and bumps in one breast and not the other. 6. Look at each breast for changes in your skin, such as: ? Redness. ? Scaly areas. 7. Look for changes in your nipples, such as: ? Discharge. ? Bleeding. ? Dimpling. ? Redness. ? A change in position. Feel for Changes Carefully feel your breasts for lumps and changes. It is best to do this while lying on your back on the floor and again while sitting or standing in the shower or tub with soapy water on your skin. Feel each breast in the following way:  Place the arm on the side of the breast you are examining above your head.  Feel your breast with the other hand.  Start in the nipple area and make  inch (2 cm) overlapping circles to feel your breast. Use the pads of your three middle fingers to do this. Apply light pressure, then medium pressure, then firm pressure. The light pressure will allow you to feel the tissue closest to the skin. The medium pressure will allow you to feel the tissue that is a little deeper.  The firm pressure will allow you to feel the tissue close to the ribs.  Continue the overlapping circles, moving downward over the breast until you feel your ribs below your breast.  Move one finger-width toward the center of the body. Continue to use the  inch (2 cm) overlapping circles to feel your breast as you move slowly up toward your collarbone.  Continue the up and down exam using all three pressures until you reach your armpit.  Write Down What You Find  Write down what is normal for each breast and any changes that you find. Keep a written record with breast changes or normal findings for each breast. By writing this information down, you do not need to depend only on memory for size, tenderness, or location. Write down where you are in your menstrual cycle, if you are  still menstruating. If you are having trouble noticing differences in your breasts, do not get discouraged. With time you will become more familiar with the variations in your breasts and more comfortable with the exam. How often should I examine my breasts? Examine your breasts every month. If you are breastfeeding, the best time to examine your breasts is after a feeding or after using a breast pump. If you menstruate, the best time to examine your breasts is 5-7 days after your period is over. During your period, your breasts are lumpier, and it may be more difficult to notice changes. When should I see my health care provider? See your health care provider if you notice:  A change in shape or size of your breasts or nipples.  A change in the skin of your breast or nipples, such as a reddened or scaly area.  Unusual discharge from your nipples.  A lump or thick area that was not there before.  Pain in your breasts.  Anything that concerns you. EXERCISE AND DIET:  We recommended that you start or continue a regular exercise program for good health. Regular exercise means any activity that makes your heart beat  faster and makes you sweat.  We recommend exercising at least 30 minutes per day at least 3 days a week, preferably 4 or 5.  We also recommend a diet low in fat and sugar.  Inactivity, poor dietary choices and obesity can cause diabetes, heart attack, stroke, and kidney damage, among others.    ALCOHOL AND SMOKING:  Women should limit their alcohol intake to no more than 7 drinks/beers/glasses of wine (combined, not each!) per week. Moderation of alcohol intake to this level decreases your risk of breast cancer and liver damage. And of course, no recreational drugs are part of a healthy lifestyle.  And absolutely no smoking or even second hand smoke. Most people know smoking can cause heart and lung diseases, but did you know it also contributes to weakening of your bones? Aging of your skin?  Yellowing of your teeth and nails?  CALCIUM AND VITAMIN D:  Adequate intake of calcium and Vitamin D are recommended.  The recommendations for exact amounts of these supplements seem to change often, but generally speaking 1,200 mg of calcium (between diet and supplement) and 800 units of Vitamin D per day seems prudent. Certain women may benefit from higher intake of Vitamin D.  If you are among these women, your doctor will have told you during your visit.    PAP SMEARS:  Pap smears, to check for cervical cancer or precancers,  have traditionally been done yearly, although recent scientific advances have shown that most women can have pap smears less often.  However, every woman still should have a physical exam from her gynecologist every year. It will include a breast check, inspection of the vulva and vagina to check for abnormal growths or skin changes, a visual exam of the cervix, and then an exam to evaluate the size and shape of the uterus and ovaries.  And after 70 years of age, a rectal exam is indicated to check for rectal cancers. We will also provide age appropriate advice regarding health maintenance, like  when you should have certain vaccines, screening for sexually transmitted diseases, bone density testing, colonoscopy, mammograms, etc.   MAMMOGRAMS:  All women over 2 years old should have a yearly mammogram. Many facilities now offer a "3D" mammogram, which may cost around $50 extra out of pocket. If  possible,  we recommend you accept the option to have the 3D mammogram performed.  It both reduces the number of women who will be called back for extra views which then turn out to be normal, and it is better than the routine mammogram at detecting truly abnormal areas.    COLON CANCER SCREENING: Now recommend starting at age 68. At this time colonoscopy is not covered for routine screening until 50. There are take home tests that can be done between 45-49.   COLONOSCOPY:  Colonoscopy to screen for colon cancer is recommended for all women at age 62.  We know, you hate the idea of the prep.  We agree, BUT, having colon cancer and not knowing it is worse!!  Colon cancer so often starts as a polyp that can be seen and removed at colonscopy, which can quite literally save your life!  And if your first colonoscopy is normal and you have no family history of colon cancer, most women don't have to have it again for 10 years.  Once every ten years, you can do something that may end up saving your life, right?  We will be happy to help you get it scheduled when you are ready.  Be sure to check your insurance coverage so you understand how much it will cost.  It may be covered as a preventative service at no cost, but you should check your particular policy.      Breast Self-Awareness Breast self-awareness means being familiar with how your breasts look and feel. It involves checking your breasts regularly and reporting any changes to your health care provider. Practicing breast self-awareness is important. A change in your breasts can be a sign of a serious medical problem. Being familiar with how your breasts  look and feel allows you to find any problems early, when treatment is more likely to be successful. All women should practice breast self-awareness, including women who have had breast implants. How to do a breast self-exam One way to learn what is normal for your breasts and whether your breasts are changing is to do a breast self-exam. To do a breast self-exam: Look for Changes  8. Remove all the clothing above your waist. 9. Stand in front of a mirror in a room with good lighting. 10. Put your hands on your hips. 11. Push your hands firmly downward. 12. Compare your breasts in the mirror. Look for differences between them (asymmetry), such as: ? Differences in shape. ? Differences in size. ? Puckers, dips, and bumps in one breast and not the other. 13. Look at each breast for changes in your skin, such as: ? Redness. ? Scaly areas. 14. Look for changes in your nipples, such as: ? Discharge. ? Bleeding. ? Dimpling. ? Redness. ? A change in position. Feel for Changes Carefully feel your breasts for lumps and changes. It is best to do this while lying on your back on the floor and again while sitting or standing in the shower or tub with soapy water on your skin. Feel each breast in the following way:  Place the arm on the side of the breast you are examining above your head.  Feel your breast with the other hand.  Start in the nipple area and make  inch (2 cm) overlapping circles to feel your breast. Use the pads of your three middle fingers to do this. Apply light pressure, then medium pressure, then firm pressure. The light pressure will allow you to feel  the tissue closest to the skin. The medium pressure will allow you to feel the tissue that is a little deeper. The firm pressure will allow you to feel the tissue close to the ribs.  Continue the overlapping circles, moving downward over the breast until you feel your ribs below your breast.  Move one finger-width toward the  center of the body. Continue to use the  inch (2 cm) overlapping circles to feel your breast as you move slowly up toward your collarbone.  Continue the up and down exam using all three pressures until you reach your armpit.  Write Down What You Find  Write down what is normal for each breast and any changes that you find. Keep a written record with breast changes or normal findings for each breast. By writing this information down, you do not need to depend only on memory for size, tenderness, or location. Write down where you are in your menstrual cycle, if you are still menstruating. If you are having trouble noticing differences in your breasts, do not get discouraged. With time you will become more familiar with the variations in your breasts and more comfortable with the exam. How often should I examine my breasts? Examine your breasts every month. If you are breastfeeding, the best time to examine your breasts is after a feeding or after using a breast pump. If you menstruate, the best time to examine your breasts is 5-7 days after your period is over. During your period, your breasts are lumpier, and it may be more difficult to notice changes. When should I see my health care provider? See your health care provider if you notice:  A change in shape or size of your breasts or nipples.  A change in the skin of your breast or nipples, such as a reddened or scaly area.  Unusual discharge from your nipples.  A lump or thick area that was not there before.  Pain in your breasts.  Anything that concerns you.   Prediabetes Prediabetes is the condition of having a blood sugar (blood glucose) level that is higher than it should be, but not high enough for you to be diagnosed with type 2 diabetes. Having prediabetes puts you at risk for developing type 2 diabetes (type 2 diabetes mellitus). Prediabetes may be called impaired glucose tolerance or impaired fasting glucose. Prediabetes usually  does not cause symptoms. Your health care provider can diagnose this condition with blood tests. You may be tested for prediabetes if you are overweight and if you have at least one other risk factor for prediabetes. What is blood glucose, and how is it measured? Blood glucose refers to the amount of glucose in your bloodstream. Glucose comes from eating foods that contain sugars and starches (carbohydrates), which the body breaks down into glucose. Your blood glucose level may be measured in mg/dL (milligrams per deciliter) or mmol/L (millimoles per liter). Your blood glucose may be checked with one or more of the following blood tests:  A fasting blood glucose (FBG) test. You will not be allowed to eat (you will fast) for 8 hours or longer before a blood sample is taken. ? A normal range for FBG is 70-100 mg/dl (3.9-5.6 mmol/L).  An A1c (hemoglobin A1c) blood test. This test provides information about blood glucose control over the previous 2?55months.  An oral glucose tolerance test (OGTT). This test measures your blood glucose at two times: ? After fasting. This is your baseline level. ? Two hours after you  drink a beverage that contains glucose. You may be diagnosed with prediabetes:  If your FBG is 100?125 mg/dL (5.6-6.9 mmol/L).  If your A1c level is 5.7?6.4%.  If your OGTT result is 140?199 mg/dL (7.8-11 mmol/L). These blood tests may be repeated to confirm your diagnosis. How can this condition affect me? The pancreas produces a hormone (insulin) that helps to move glucose from the bloodstream into cells. When cells in the body do not respond properly to insulin that the body makes (insulin resistance), excess glucose builds up in the blood instead of going into cells. As a result, high blood glucose (hyperglycemia) can develop, which can cause many complications. Hyperglycemia is a symptom of prediabetes. Having high blood glucose for a long time is dangerous. Too much glucose in your  blood can damage your nerves and blood vessels. Long-term damage can lead to complications from diabetes, which may include:  Heart disease.  Stroke.  Blindness.  Kidney disease.  Depression.  Poor circulation in the feet and legs, which could lead to surgical removal (amputation) in severe cases. What can increase my risk? Risk factors for prediabetes include:  Having a family member with type 2 diabetes.  Being overweight or obese.  Being older than age 47.  Being of American [place], African-American, Hispanic/Latino, or Asian/Pacific Islander descent.  Having an inactive (sedentary) lifestyle.  Having a history of heart disease.  History of gestational diabetes or polycystic ovary syndrome (PCOS), in women.  Having low levels of good cholesterol (HDL-C) or high levels of blood fats (triglycerides).  Having high blood pressure. What actions can I take to prevent diabetes?      Be physically active. ? Do moderate-intensity physical activity for 30 or more minutes on 5 or more days of the week, or as much as told by your health care provider. This could be brisk walking, biking, or water aerobics. ? Ask your health care provider what activities are safe for you. A mix of physical activities may be best, such as walking, swimming, cycling, and strength training.  Lose weight as told by your health care provider. ? Losing 5-7% of your body weight can reverse insulin resistance. ? Your health care provider can determine how much weight loss is best for you and can help you lose weight safely.  Follow a healthy meal plan. This includes eating lean proteins, complex carbohydrates, fresh fruits and vegetables, low-fat dairy products, and healthy fats. ? Follow instructions from your health care provider about eating or drinking restrictions. ? Make an appointment to see a diet and nutrition specialist (registered dietitian) to help you create a healthy eating plan that is  right for you.  Do not smoke or use any tobacco products, such as cigarettes, chewing tobacco, and e-cigarettes. If you need help quitting, ask your health care provider.  Take over-the-counter and prescription medicines as told by your health care provider. You may be prescribed medicines that help lower the risk of type 2 diabetes.  Keep all follow-up visits as told by your health care provider. This is important. Summary  Prediabetes is the condition of having a blood sugar (blood glucose) level that is higher than it should be, but not high enough for you to be diagnosed with type 2 diabetes.  Having prediabetes puts you at risk for developing type 2 diabetes (type 2 diabetes mellitus).  To help prevent type 2 diabetes, make lifestyle changes such as being physically active and eating a healthy diet. Lose weight as told by  your health care provider. This information is not intended to replace advice given to you by your health care provider. Make sure you discuss any questions you have with your health care provider. Document Released: 02/06/2016 Document Revised: 02/06/2019 Document Reviewed: 12/06/2015 Elsevier Patient Education  2020 [employer].

## 2019-09-09 LAB — COMPREHENSIVE METABOLIC PANEL
ALT: 15 IU/L (ref 0–32)
AST: 19 IU/L (ref 0–40)
Albumin/Globulin Ratio: 1.8 (ref 1.2–2.2)
Albumin: 4.2 g/dL (ref 3.8–4.8)
Alkaline Phosphatase: 81 IU/L (ref 39–117)
BUN/Creatinine Ratio: 12 (ref 12–28)
BUN: 11 mg/dL (ref 8–27)
Bilirubin Total: 0.4 mg/dL (ref 0.0–1.2)
CO2: 27 mmol/L (ref 20–29)
Calcium: 9.4 mg/dL (ref 8.7–10.3)
Chloride: 101 mmol/L (ref 96–106)
Creatinine, Ser: 0.94 mg/dL (ref 0.57–1.00)
GFR calc Af Amer: 72 mL/min/{1.73_m2} (ref >59)
GFR calc non Af Amer: 62 mL/min/{1.73_m2} (ref >59)
Globulin, Total: 2.4 g/dL (ref 1.5–4.5)
Glucose: 103 mg/dL — ABNORMAL HIGH (ref 65–99)
Potassium: 4.5 mmol/L (ref 3.5–5.2)
Sodium: 139 mmol/L (ref 134–144)
Total Protein: 6.6 g/dL (ref 6.0–8.5)

## 2019-09-09 LAB — CBC
Hematocrit: 41.9 % (ref 34.0–46.6)
Hemoglobin: 13.7 g/dL (ref 11.1–15.9)
MCH: 27.7 pg (ref 26.6–33.0)
MCHC: 32.7 g/dL (ref 31.5–35.7)
MCV: 85 fL (ref 79–97)
Platelets: 340 10*3/uL (ref 150–450)
RBC: 4.95 x10E6/uL (ref 3.77–5.28)
RDW: 13.1 % (ref 11.7–15.4)
WBC: 7.2 10*3/uL (ref 3.4–10.8)

## 2019-09-09 LAB — LIPID PANEL
Chol/HDL Ratio: 3.6 ratio (ref 0.0–4.4)
Cholesterol, Total: 256 mg/dL — ABNORMAL HIGH (ref 100–199)
HDL: 72 mg/dL (ref >39)
LDL Chol Calc (NIH): 164 mg/dL — ABNORMAL HIGH (ref 0–99)
Triglycerides: 113 mg/dL (ref 0–149)
VLDL Cholesterol Cal: 20 mg/dL (ref 5–40)

## 2019-09-09 LAB — HEMOGLOBIN A1C
Est. average glucose Bld gHb Est-mCnc: 117 mg/dL
Hgb A1c MFr Bld: 5.7 % — ABNORMAL HIGH (ref 4.8–5.6)

## 2019-09-09 LAB — VITAMIN D 25 HYDROXY (VIT D DEFICIENCY, FRACTURES): Vit D, 25-Hydroxy: 46.6 ng/mL (ref 30.0–100.0)

## 2019-09-10 ENCOUNTER — Other Ambulatory Visit: Payer: Self-pay | Admitting: Obstetrics and Gynecology

## 2019-09-10 DIAGNOSIS — N63 Unspecified lump in unspecified breast: Secondary | ICD-10-CM

## 2019-09-22 ENCOUNTER — Other Ambulatory Visit: Payer: Medicare Other

## 2019-09-29 ENCOUNTER — Ambulatory Visit
Admission: RE | Admit: 2019-09-29 | Discharge: 2019-09-29 | Disposition: A | Discharge status: Home / Self Care | Payer: Medicare Other | Source: Ambulatory Visit | Attending: Obstetrics and Gynecology | Admitting: Obstetrics and Gynecology

## 2019-09-29 ENCOUNTER — Other Ambulatory Visit: Payer: Self-pay

## 2019-09-29 DIAGNOSIS — N63 Unspecified lump in unspecified breast: Secondary | ICD-10-CM

## 2019-12-04 ENCOUNTER — Other Ambulatory Visit: Payer: Medicare Other

## 2019-12-26 ENCOUNTER — Ambulatory Visit: Payer: Medicare Other

## 2019-12-31 ENCOUNTER — Telehealth: Payer: Self-pay | Admitting: Family Medicine

## 2019-12-31 NOTE — Telephone Encounter (Signed)
Pt called in stating that she had called in previously and was transferred to the triage nurse and they suggested that she go to [facility] or ED for the cut on her finger.  Pt declined to go to ED or UC pt was wanting to be seen by her provider pt was made aware of the provider not being in the office today and there was no other provider in the office to see her.  The other providers in the office only had virtual appts and was not available and suggest that she go to the ED or UC.  Pt still decline ED or UC and hung up.

## 2020-01-19 ENCOUNTER — Encounter: Payer: Self-pay | Admitting: Certified Nurse Midwife

## 2020-02-11 ENCOUNTER — Ambulatory Visit
Admission: RE | Admit: 2020-02-11 | Discharge: 2020-02-11 | Disposition: A | Discharge status: Home / Self Care | Payer: Medicare Other | Source: Ambulatory Visit | Attending: Obstetrics and Gynecology | Admitting: Obstetrics and Gynecology

## 2020-02-11 ENCOUNTER — Other Ambulatory Visit: Payer: Self-pay

## 2020-02-11 DIAGNOSIS — M8580 Other specified disorders of bone density and structure, unspecified site: Secondary | ICD-10-CM

## 2020-02-12 ENCOUNTER — Other Ambulatory Visit: Payer: Self-pay | Admitting: Psychiatry

## 2020-02-12 DIAGNOSIS — F5101 Primary insomnia: Secondary | ICD-10-CM

## 2020-03-07 ENCOUNTER — Other Ambulatory Visit: Payer: Self-pay

## 2020-03-07 ENCOUNTER — Other Ambulatory Visit: Payer: Self-pay | Admitting: Psychiatry

## 2020-03-07 ENCOUNTER — Ambulatory Visit (HOSPITAL_BASED_OUTPATIENT_CLINIC_OR_DEPARTMENT_OTHER): Payer: Medicare Other | Attending: Family Medicine | Admitting: Internal Medicine

## 2020-03-07 VITALS — Ht 66.0 in | Wt 160.0 lb

## 2020-03-07 DIAGNOSIS — R0683 Snoring: Secondary | ICD-10-CM | POA: Diagnosis present

## 2020-03-07 DIAGNOSIS — F3342 Major depressive disorder, recurrent, in full remission: Secondary | ICD-10-CM

## 2020-03-07 DIAGNOSIS — G4733 Obstructive sleep apnea (adult) (pediatric): Secondary | ICD-10-CM

## 2020-03-12 DIAGNOSIS — G4733 Obstructive sleep apnea (adult) (pediatric): Secondary | ICD-10-CM

## 2020-03-12 NOTE — Procedures (Signed)
    Patient Name: Lori Harvey, Guthridge Date: 03/07/2020 Gender: Female D.O.B: Sep 09, 1951 Age (years): 44 Referring Provider: Alinda Sierras Burchette Height (inches): 41 Interpreting Physician: Baird Lyons MD, ABSM Weight (lbs): 160 RPSGT: Jacolyn Reedy BMI: 26 MRN: [id number] Neck Size: 14.00  CLINICAL INFORMATION Sleep Study Type: HST Indication for sleep study: Anxiety, Snoring, Snoring (786.09) Epworth Sleepiness Score: 13  SLEEP STUDY TECHNIQUE A multi-channel overnight portable sleep study was performed. The channels recorded were: nasal airflow, thoracic respiratory movement, and oxygen saturation with a pulse oximetry. Snoring was also monitored.  MEDICATIONS Patient self administered medications include: none reported.  SLEEP ARCHITECTURE Patient was studied for 502.9 minutes. The sleep efficiency was 100.0 % and the patient was supine for 22.8%. The arousal index was 0.0 per hour.  RESPIRATORY PARAMETERS The overall AHI was 18.6 per hour, with a central apnea index of 0.0 per hour. The oxygen nadir was 77% during sleep.  CARDIAC DATA Mean heart rate during sleep was 75.3 bpm.  IMPRESSIONS - Moderate obstructive sleep apnea occurred during this study (AHI = 18.6/h). - No significant central sleep apnea occurred during this study (CAI = 0.0/h). - Oxygen desaturation was noted during this study (Min O2 = 77%). Mean sat 93%. - Patient snored.  DIAGNOSIS - Obstructive Sleep Apnea (327.23 [G47.33 ICD-10])  RECOMMENDATIONS - Suggest CPAP titration sleep study or autopap. Other options would be based on clinical judgment. - Be careful with alcohol, sedatives and other CNS depressants that may worsen sleep apnea and disrupt normal sleep architecture. - Sleep hygiene should be reviewed to assess factors that may improve sleep quality. - Weight management and regular exercise should be initiated or continued.  [Electronically signed] 03/12/2020 10:45 AM  Baird Lyons MD, ABSM Diplomate, American Board of Sleep Medicine   NPI: [id number]                         [place], [place] [facility]  ELECTRONICALLY SIGNED ON:  03/12/2020, 10:43 AM [place] PH: [phone]   FX: [phone] [place]

## 2020-03-12 NOTE — Progress Notes (Signed)
Pt has obstructive sleep apnea by recent study.  See if she would like to set up office or virtual follow up to review and discuss next steps.

## 2020-03-14 NOTE — Progress Notes (Signed)
Pt was out of town this week but has been scheduled for 03/21/20

## 2020-03-18 ENCOUNTER — Other Ambulatory Visit: Payer: Self-pay

## 2020-03-21 ENCOUNTER — Other Ambulatory Visit: Payer: Self-pay

## 2020-03-21 ENCOUNTER — Ambulatory Visit (INDEPENDENT_AMBULATORY_CARE_PROVIDER_SITE_OTHER): Payer: Medicare Other | Admitting: Family Medicine

## 2020-03-21 ENCOUNTER — Encounter: Payer: Self-pay | Admitting: Family Medicine

## 2020-03-21 VITALS — BP 136/82 | HR 80 | Temp 97.6°F | Wt 163.6 lb

## 2020-03-21 DIAGNOSIS — Z8249 Family history of ischemic heart disease and other diseases of the circulatory system: Secondary | ICD-10-CM

## 2020-03-21 DIAGNOSIS — E785 Hyperlipidemia, unspecified: Secondary | ICD-10-CM

## 2020-03-21 DIAGNOSIS — G4733 Obstructive sleep apnea (adult) (pediatric): Secondary | ICD-10-CM | POA: Diagnosis not present

## 2020-03-21 NOTE — Patient Instructions (Addendum)
Sleep Apnea Sleep apnea is a condition in which breathing pauses or becomes shallow during sleep. Episodes of sleep apnea usually last 10 seconds or longer, and they may occur as many as 20 times an hour. Sleep apnea disrupts your sleep and keeps your body from getting the rest that it needs. This condition can increase your risk of certain health problems, including:  Heart attack.  Stroke.  Obesity.  Diabetes.  Heart failure.  Irregular heartbeat. What are the causes? There are three kinds of sleep apnea:  Obstructive sleep apnea. This kind is caused by a blocked or collapsed airway.  Central sleep apnea. This kind happens when the part of the brain that controls breathing does not send the correct signals to the muscles that control breathing.  Mixed sleep apnea. This is a combination of obstructive and central sleep apnea. The most common cause of this condition is a collapsed or blocked airway. An airway can collapse or become blocked if:  Your throat muscles are abnormally relaxed.  Your tongue and tonsils are larger than normal.  You are overweight.  Your airway is smaller than normal. What increases the risk? You are more likely to develop this condition if you:  Are overweight.  Smoke.  Have a smaller than normal airway.  Are elderly.  Are female.  Drink alcohol.  Take sedatives or tranquilizers.  Have a family history of sleep apnea. What are the signs or symptoms? Symptoms of this condition include:  Trouble staying asleep.  Daytime sleepiness and tiredness.  Irritability.  Loud snoring.  Morning headaches.  Trouble concentrating.  Forgetfulness.  Decreased interest in sex.  Unexplained sleepiness.  Mood swings.  Personality changes.  Feelings of depression.  Waking up often during the night to urinate.  Dry mouth.  Sore throat. How is this diagnosed? This condition may be diagnosed with:  A medical history.  A physical  exam.  A series of tests that are done while you are sleeping (sleep study). These tests are usually done in a sleep lab, but they may also be done at home. How is this treated? Treatment for this condition aims to restore normal breathing and to ease symptoms during sleep. It may involve managing health issues that can affect breathing, such as high blood pressure or obesity. Treatment may include:  Sleeping on your side.  Using a decongestant if you have nasal congestion.  Avoiding the use of depressants, including alcohol, sedatives, and narcotics.  Losing weight if you are overweight.  Making changes to your diet.  Quitting smoking.  Using a device to open your airway while you sleep, such as: ? An oral appliance. This is a custom-made mouthpiece that shifts your lower jaw forward. ? A continuous positive airway pressure (CPAP) device. This device blows air through a mask when you breathe out (exhale). ? A nasal expiratory positive airway pressure (EPAP) device. This device has valves that you put into each nostril. ? A bi-level positive airway pressure (BPAP) device. This device blows air through a mask when you breathe in (inhale) and breathe out (exhale).  Having surgery if other treatments do not work. During surgery, excess tissue is removed to create a wider airway. It is important to get treatment for sleep apnea. Without treatment, this condition can lead to:  High blood pressure.  Coronary artery disease.  In men, an inability to achieve or maintain an erection (impotence).  Reduced thinking abilities. Follow these instructions at home: Lifestyle  Make any lifestyle changes   that your health care provider recommends.  Eat a healthy, well-balanced diet.  Take steps to lose weight if you are overweight.  Avoid using depressants, including alcohol, sedatives, and narcotics.  Do not use any products that contain nicotine or tobacco, such as cigarettes,  e-cigarettes, and chewing tobacco. If you need help quitting, ask your health care provider. General instructions  Take over-the-counter and prescription medicines only as told by your health care provider.  If you were given a device to open your airway while you sleep, use it only as told by your health care provider.  If you are having surgery, make sure to tell your health care provider you have sleep apnea. You may need to bring your device with you.  Keep all follow-up visits as told by your health care provider. This is important. Contact a health care provider if:  The device that you received to open your airway during sleep is uncomfortable or does not seem to be working.  Your symptoms do not improve.  Your symptoms get worse. Get help right away if:  You develop: ? Chest pain. ? Shortness of breath. ? Discomfort in your back, arms, or stomach.  You have: ? Trouble speaking. ? Weakness on one side of your body. ? Drooping in your face. These symptoms may represent a serious problem that is an emergency. Do not wait to see if the symptoms will go away. Get medical help right away. Call your local emergency services (911 in the [place] Do not drive yourself to the hospital. Summary  Sleep apnea is a condition in which breathing pauses or becomes shallow during sleep.  The most common cause is a collapsed or blocked airway.  The goal of treatment is to restore normal breathing and to ease symptoms during sleep. This information is not intended to replace advice given to you by your health care provider. Make sure you discuss any questions you have with your health care provider. Document Revised: 04/01/2019 Document Reviewed: 06/10/2018 Elsevier Patient Education  [place].  We will set up CPAP titration study.

## 2020-03-21 NOTE — Progress Notes (Signed)
Subjective:     Patient ID: Lori Harvey, female   DOB: 12/26/1950, 69 y.o.   MRN: [id number]  HPI  Lori Harvey is here to discuss recent sleep study.  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  She has been conscious of waking up occasionally gasping for air.  She does not feel particularly rested in the mornings.  We discussed getting a sleep study before the pandemic and she just recently got done.  She had study results which revealed moderate obstructive sleep apnea.  Her AHI score was 18.6.  No central apnea.  Oxygen desaturations down to 77% with mean saturation 93%.  She has tried dental appliance with mouthguard as well as chinstrap without improvement.  She is fairly active with exercise.  Her weight has been stable.  She does not take any regular sedative hypnotics.  No regular alcohol use.  She has hyperlipidemia by labs done last fall through GYN.  Her cholesterol is 252 with LDL cholesterol 164.  She has very strong family history of heart disease and father and brother in their 23s.  She has never been on statin.  Tries to follow a low saturated fat diet  Past Medical History:  Diagnosis Date  . Cancer (HCC)    right leg, upper thigh AND CALF melanoma  . Depression   . GERD (gastroesophageal reflux disease)   . Hypertension    Patient denies  . Melanoma ([place])   . Osteopenia   . Prediabetes   . UTI (lower urinary tract infection)    Past Surgical History:  Procedure Laterality Date  . BREAST BIOPSY Left   . COLONOSCOPY    . MELANOMA EXCISION  2006   right thigh and calf  . REFRACTIVE SURGERY    . TONSILLECTOMY    . WISDOM TOOTH EXTRACTION      reports that she has never smoked. She has never used smokeless tobacco. She reports current alcohol use of about 1.0 - 2.0 standard drinks of alcohol per week. She reports that she does not use drugs. family history includes Alcohol abuse in her father;  Anxiety disorder in her maternal aunt; Breast cancer in her mother; Cancer in her mother; Colon cancer (age of onset: 66) in her sister; Colon polyps in her sister and sister; Depression in her mother and sister; Heart attack in her father; Hypertension in her sister and sister; Other (age of onset: 74) in her sister; Other (age of onset: 20) in her sister; Prostate cancer in her brother; Stroke in her mother. Allergies  Allergen Reactions  . Keflex [Cephalexin]     Nausea, hives  . Sulfa Antibiotics Rash    Review of Systems  Constitutional: Negative for appetite change, chills, fever and unexpected weight change.  Respiratory: Negative for cough and shortness of breath.   Cardiovascular: Negative for chest pain.       Objective:   Physical Exam Vitals reviewed.  Constitutional:      Appearance: Normal appearance.  HENT:     Mouth/Throat:     Comments: Clear.  She has had previous tonsillectomy Cardiovascular:     Rate and Rhythm: Normal rate and regular rhythm.     Heart sounds: No murmur.  Pulmonary:     Effort: Pulmonary effort is normal.     Breath sounds: Normal breath sounds. No wheezing or rales.  Musculoskeletal:     Right lower leg: No edema.  Left lower leg: No edema.  Neurological:     Mental Status: She is alert.        Assessment:     #1 moderate obstructive sleep apnea by recent sleep study.  She has some increased daytime fatigue and somnolence  #2 hyperlipidemia.  Her 10-year risk of CAD event is 10.8%    Plan:     -Set up CPAP titration study -We discussed pros and cons of statin use.  She would like to think this over before deciding on statin use.  She will let us know she change her mind regarding starting statin and will probably start either Crestor or Lipitor if she decides to go this route  Eulas Post MD [facility]

## 2020-04-16 ENCOUNTER — Other Ambulatory Visit (HOSPITAL_COMMUNITY)
Admission: RE | Admit: 2020-04-16 | Discharge: 2020-04-16 | Disposition: A | Discharge status: Home / Self Care | Payer: Medicare Other | Source: Ambulatory Visit | Attending: Internal Medicine | Admitting: Internal Medicine

## 2020-04-16 DIAGNOSIS — Z01812 Encounter for preprocedural laboratory examination: Secondary | ICD-10-CM | POA: Insufficient documentation

## 2020-04-16 DIAGNOSIS — Z20822 Contact with and (suspected) exposure to covid-19: Secondary | ICD-10-CM | POA: Insufficient documentation

## 2020-04-16 LAB — SARS CORONAVIRUS 2 (TAT 6-24 HRS): SARS Coronavirus 2: NEGATIVE

## 2020-04-18 ENCOUNTER — Ambulatory Visit (HOSPITAL_BASED_OUTPATIENT_CLINIC_OR_DEPARTMENT_OTHER): Payer: Medicare Other | Attending: Family Medicine | Admitting: Internal Medicine

## 2020-04-18 ENCOUNTER — Other Ambulatory Visit: Payer: Self-pay

## 2020-04-18 VITALS — Ht 66.0 in | Wt 153.0 lb

## 2020-04-18 DIAGNOSIS — G4761 Periodic limb movement disorder: Secondary | ICD-10-CM | POA: Insufficient documentation

## 2020-04-18 DIAGNOSIS — G4733 Obstructive sleep apnea (adult) (pediatric): Secondary | ICD-10-CM | POA: Diagnosis not present

## 2020-04-18 DIAGNOSIS — I493 Ventricular premature depolarization: Secondary | ICD-10-CM | POA: Insufficient documentation

## 2020-04-18 DIAGNOSIS — Z79899 Other long term (current) drug therapy: Secondary | ICD-10-CM | POA: Insufficient documentation

## 2020-04-23 DIAGNOSIS — G4733 Obstructive sleep apnea (adult) (pediatric): Secondary | ICD-10-CM | POA: Diagnosis not present

## 2020-04-23 NOTE — Procedures (Signed)
Patient Name: Lori Harvey, Lori Harvey Date: 04/18/2020 Gender: Female D.O.B: June 26, 1951 Age (years): 50 Referring Provider: Alinda Sierras Burchette Height (inches): 66 Interpreting Physician: Baird Lyons MD, ABSM Weight (lbs): 153 RPSGT: Carolin Coy BMI: 25 MRN: [id number] Neck Size: 13.00  CLINICAL INFORMATION The patient is referred for a CPAP titration to treat sleep apnea.  Date of NPSG, Split Night or HST: HST 03/07/20  AHI 18.6/ hr, desaturation to 77%, body weight 160 lbs  SLEEP STUDY TECHNIQUE As per the AASM Manual for the Scoring of Sleep and Associated Events v2.3 (April 2016) with a hypopnea requiring 4% desaturations.  The channels recorded and monitored were frontal, central and occipital EEG, electrooculogram (EOG), submentalis EMG (chin), nasal and oral airflow, thoracic and abdominal wall motion, anterior tibialis EMG, snore microphone, electrocardiogram, and pulse oximetry. Continuous positive airway pressure (CPAP) was initiated at the beginning of the study and titrated to treat sleep-disordered breathing.  MEDICATIONS Medications self-administered by patient taken the night of the study : TRAZODONE  TECHNICIAN COMMENTS Comments added by technician: PATIENT WAS ORDERED AS A CPAP TITRATION. Comments added by scorer: N/A RESPIRATORY PARAMETERS Optimal PAP Pressure (cm): 17 AHI at Optimal Pressure (/hr): 0.0 Overall Minimal O2 (%): 89.0 Supine % at Optimal Pressure (%): 100 Minimal O2 at Optimal Pressure (%): 94.0   SLEEP ARCHITECTURE The study was initiated at 10:57:06 PM and ended at 4:54:37 AM.  Sleep onset time was 2.7 minutes and the sleep efficiency was 83.7%%. The total sleep time was 299.3 minutes.  The patient spent 17.0%% of the night in stage N1 sleep, 71.2%% in stage N2 sleep, 0.0%% in stage N3 and 11.9% in REM.Stage REM latency was 212.5 minutes  Wake after sleep onset was 55.5. Alpha intrusion was absent. Supine sleep was 62.41%.  CARDIAC  DATA The 2 lead EKG demonstrated sinus rhythm. The mean heart rate was 64.2 beats per minute. Other EKG findings include: PVCs.  LEG MOVEMENT DATA The total Periodic Limb Movements of Sleep (PLMS) were 0. The PLMS index was 0.0. A PLMS index of <15 is considered normal in adults.  IMPRESSIONS - The optimal PAP pressure was 17 cm of water. - Central sleep apnea was not noted during this titration (CAI = 0.2/h). - Mild oxygen desaturations were observed during this titration (min O2 = 89.0%).Min sat onn CPAP 17 was 94%. - The patient snored with moderate snoring volume during this titration study. - 2-lead EKG demonstrated: PVCs - Limb movement sleep disturbance was noted. Total limb movements 178. Limb movements with arousal 31 ( 6.2/ hr).  DIAGNOSIS - Obstructive Sleep Apnea (327.23 [G47.33 ICD-10]) - Periodic Limb Movement During Sleep (327.51 [G47.61 ICD-10])  RECOMMENDATIONS - Trial of CPAP therapy on 17 cm H2O or autopap 10-20. - Patient used a Medium size Resmed Full Face Mask AirFit F30i mask and heated humidification. - Be careful with alcohol, sedatives and other CNS depressants that may worsen sleep apnea and disrupt normal sleep architecture. - Sleep hygiene should be reviewed to assess factors that may improve sleep quality. - Weight management and regular exercise should be initiated or continued. - If limb movement sleep disturbance is a persistent issue in the home environment after the patient has adjusted to CPAP, consider a trial of Requip or Mirapex.  [Electronically signed] 04/23/2020 11:42 AM  Baird Lyons MD, [place], American Board of Sleep Medicine   NPI: [id number]  [place], [employer] adviser of Sleep Medicine  ELECTRONICALLY SIGNED ON:  04/23/2020, 11:44 AM [place] PH: [phone]   FX: [phone] [place]

## 2020-04-26 ENCOUNTER — Telehealth: Payer: Self-pay | Admitting: Family Medicine

## 2020-04-26 NOTE — Telephone Encounter (Signed)
Left message for patient to schedule Annual Wellness Visit.  Please schedule with Nurse Health Advisor Shannon Crews, RN at [facility]  

## 2020-05-29 ENCOUNTER — Other Ambulatory Visit: Payer: Self-pay | Admitting: Psychiatry

## 2020-05-29 DIAGNOSIS — F3342 Major depressive disorder, recurrent, in full remission: Secondary | ICD-10-CM

## 2020-05-30 NOTE — Telephone Encounter (Signed)
Needs to schedule apt last visit 08/2019, was due back in May

## 2020-05-31 NOTE — Telephone Encounter (Signed)
Noted thank you and refills sent

## 2020-05-31 NOTE — Telephone Encounter (Signed)
She has an appt on 8/19

## 2020-06-01 ENCOUNTER — Telehealth: Payer: Self-pay | Admitting: Family Medicine

## 2020-06-01 DIAGNOSIS — G4733 Obstructive sleep apnea (adult) (pediatric): Secondary | ICD-10-CM

## 2020-06-01 NOTE — Telephone Encounter (Signed)
Please advise on sleep study report

## 2020-06-01 NOTE — Telephone Encounter (Signed)
  Who's calling (name and relationship to patient) : Lori Harvey contact number: [phone]  Provider they [id number]   Reason for call: Spoke and scheduled Wellness visit with patient.    She also want to know the results of her sleep study.  Please call.    PRESCRIPTION REFILL ONLY  Name of prescription:  Pharmacy:    Spoke and scheduled Wellness visit with patient.  She also want to know the results of her sleep study.  Please cal

## 2020-06-01 NOTE — Telephone Encounter (Signed)
Pt needed a referral for pulmonary . Referral has been placed and pt given information

## 2020-06-01 NOTE — Telephone Encounter (Signed)
Recent sleep study confirmed moderated obstructive apnea.  CPAP titration study showed optimal PAP pressure setting up 17 cm H2O We need to confirm if she has someone coming out to set up her CPAP.  Was not sure if pulmonary had arranged.

## 2020-06-09 ENCOUNTER — Ambulatory Visit: Payer: Medicare Other

## 2020-06-12 ENCOUNTER — Other Ambulatory Visit: Payer: Self-pay | Admitting: Psychiatry

## 2020-06-12 DIAGNOSIS — F3342 Major depressive disorder, recurrent, in full remission: Secondary | ICD-10-CM

## 2020-06-16 ENCOUNTER — Telehealth: Payer: Self-pay | Admitting: Psychiatry

## 2020-06-16 ENCOUNTER — Encounter: Payer: Self-pay | Admitting: Psychiatry

## 2020-06-16 ENCOUNTER — Telehealth (INDEPENDENT_AMBULATORY_CARE_PROVIDER_SITE_OTHER): Payer: Medicare Other | Admitting: Psychiatry

## 2020-06-16 DIAGNOSIS — F5101 Primary insomnia: Secondary | ICD-10-CM | POA: Diagnosis not present

## 2020-06-16 DIAGNOSIS — F3342 Major depressive disorder, recurrent, in full remission: Secondary | ICD-10-CM

## 2020-06-16 MED ORDER — DULOXETINE HCL 60 MG PO CPEP: ORAL_CAPSULE | ORAL | 3 refills | Status: DC

## 2020-06-16 MED ORDER — TRAZODONE HCL 50 MG PO TABS: ORAL_TABLET | ORAL | 2 refills | Status: DC

## 2020-06-16 MED ORDER — BUPROPION HCL ER (XL) 150 MG PO TB24: 150.0000 mg | ORAL_TABLET | Freq: Every day | ORAL | 3 refills | Status: DC

## 2020-06-16 MED ORDER — DULOXETINE HCL 30 MG PO CPEP: ORAL_CAPSULE | ORAL | 3 refills | Status: DC

## 2020-06-16 NOTE — Progress Notes (Signed)
Lori Harvey [id number] Feb 02, 1951 69 y.o.  Virtual Visit via Video Note  I connected with pt @ on 06/16/20 at  9:30 AM EDT by a video enabled telemedicine application and verified that I am speaking with the correct person using two identifiers.   I discussed the limitations of evaluation and management by telemedicine and the availability of in person appointments. The patient expressed understanding and agreed to proceed.  I discussed the assessment and treatment plan with the patient. The patient was provided an opportunity to ask questions and all were answered. The patient agreed with the plan and demonstrated an understanding of the instructions.   The patient was advised to call back or seek an in-person evaluation if the symptoms worsen or if the condition fails to improve as anticipated.  I provided 30 minutes of non-face-to-face time during this encounter.  The patient was located at home.  The provider was located at [place].   Thayer Headings, PMHNP   Subjective:   Patient ID:  Lori Harvey is a 69 y.o. (DOB 03/15/1951) female.  Chief Complaint:  Chief Complaint  Patient presents with  . Follow-up    h/o Depression, anxiety, and sleep disturbance    HPI [name] presents for follow-up of mood and anxiety. She reports that she has been doing well overall. She reports that her mood has been good. Denies anxiety. She reports that her sleep is an issue at times. She reports that she has been going through a period where she is not sleeping as well. She had a sleep study and a cPap machine was recommended. She reports that she has not yet started it yet. She reports that she has difficulty falling asleep and then awakens during the night and has difficulty returning to sleep. Reports taking Trazodone prn periodically. She reports that she has been more tired recently. She reports that her motivation has been better. Denies anhedonia. Concentration has been  adequate. Appetite has been good. Recently intentionally lost 10 lbs. Denies SI.   Stayed in [place] for a period of time. Recently traveled with a group friends. Has plans to travel. Reports that she has been seeing someone and this is going well. She reports that she has backed off some of her group activities.   Reports that beach condo that she bought has had some challenges with contractor leaving during remodel and she has managed this.   Past Psychiatric Medication Trials: Sertraline Lexapro Celexa Cymbalta Wellbutrin Ambien Valium Trazodone  Review of Systems:  Review of Systems  Musculoskeletal: Negative for gait problem.  Neurological: Negative for tremors.  Psychiatric/Behavioral:       Please refer to HPI    Medications: I have reviewed the patient's current medications.  Current Outpatient Medications  Medication Sig Dispense Refill  . alendronate (FOSAMAX) 70 MG tablet TAKE 1 TABLET BY MOUTH EVERY 7 (SEVEN) DAYS. TAKE WITH A FULL GLASS OF WATER ON AN EMPTY STOMACH. 12 tablet 3  . Ascorbic Acid (VITAMIN C PO) Take 1 tablet by mouth daily. Vitamin C with Zinc    . B Complex-C (SUPER B COMPLEX PO) Take by mouth.    Marland Kitchen buPROPion (WELLBUTRIN XL) 150 MG 24 hr tablet Take 1 tablet (150 mg total) by mouth daily. 90 tablet 3  . Cholecalciferol (VITAMIN D3) 125 MCG (5000 UT) CAPS     . DULoxetine (CYMBALTA) 30 MG capsule TAKE 1 CAPSULES BY MOUTH DAILY WITH 60MG  CAPSULE TO EQUAL TOTAL DAILY DOSE OF 90MG  90 capsule 3  .  DULoxetine (CYMBALTA) 60 MG capsule TAKE 1 CAPSULE BY MOUTH DAILY WITH 30MG  CAPSULE TO EQUAL TOTAL DAILY DOSE OF 90MG  90 capsule 3  . Multiple Vitamin (MULTIVITAMIN) capsule Take 1 capsule by mouth daily. Juice Plus    . Omega-3 Fatty Acids (FISH OIL) 1000 MG CAPS Take by mouth.    . Probiotic Product (PROBIOTIC DAILY PO) Take by mouth.    . traZODone (DESYREL) 50 MG tablet Take 1/2-1 tablet by mouth at bedtime as needed for sleep 90 tablet 2  . promethazine  (PHENERGAN) 12.5 MG tablet Take 1 tablet (12.5 mg total) by mouth every 6 (six) hours as needed for nausea or vomiting. 30 tablet 0   No current facility-administered medications for this visit.    Medication Side Effects: Other: Dry mouth  Allergies:  Allergies  Allergen Reactions  . Keflex [Cephalexin]     Nausea, hives  . Sulfa Antibiotics Rash    Past Medical History:  Diagnosis Date  . Cancer (HCC)    right leg, upper thigh AND CALF melanoma  . Depression   . GERD (gastroesophageal reflux disease)   . Hypertension    Patient denies  . Melanoma ([place])   . Obstructive sleep apnea   . Osteopenia   . Prediabetes   . UTI (lower urinary tract infection)     Family History  Problem Relation Age of Onset  . Cancer Mother        breast  . Stroke Mother   . Breast cancer Mother   . Depression Mother   . Alcohol abuse Father   . Heart attack Father   . Other Sister 8       colon mass, benign  . Hypertension Sister   . Colon cancer Sister 64  . Depression Sister   . Colon polyps Sister   . Prostate cancer Brother   . Other Sister 34       colon mass- not colon cancer  . Hypertension Sister   . Colon polyps Sister   . Anxiety disorder Maternal Aunt   . Esophageal cancer Neg Hx   . Rectal cancer Neg Hx   . Stomach cancer Neg Hx     Social History   Socioeconomic History  . Marital status: Widowed    Spouse name: Not on file  . Number of children: Not on file  . Years of education: Not on file  . Highest education level: Not on file  Occupational History  . Not on file  Tobacco Use  . Smoking status: Never Smoker  . Smokeless tobacco: Never Used  Vaping Use  . Vaping Use: Never used  Substance and Sexual Activity  . Alcohol use: Yes    Alcohol/week: 1.0 - 2.0 standard drink    Types: 1 - 2 Glasses of wine per week  . Drug use: No  . Sexual activity: Not Currently    Birth control/protection: Post-menopausal  Other Topics Concern  . Not on file   Social History Narrative  . Not on file   Social Determinants of Health   Financial Resource Strain:   . Difficulty of Paying Living Expenses: Not on file  Food Insecurity:   . Worried About Charity fundraiser in the Last Year: Not on file  . Ran Out of Food in the Last Year: Not on file  Transportation Needs:   . Lack of Transportation (Medical): Not on file  . Lack of Transportation (Non-Medical): Not on file  Physical Activity:   .  Days of Exercise per Week: Not on file  . Minutes of Exercise per Session: Not on file  Stress:   . Feeling of Stress : Not on file  Social Connections:   . Frequency of Communication with Friends and Family: Not on file  . Frequency of Social Gatherings with Friends and Family: Not on file  . Attends Religious Services: Not on file  . Active Member of Clubs or Organizations: Not on file  . Attends Archivist Meetings: Not on file  . Marital Status: Not on file  Intimate Partner Violence:   . Fear of Current or Ex-Partner: Not on file  . Emotionally Abused: Not on file  . Physically Abused: Not on file  . Sexually Abused: Not on file    Past Medical History, Surgical history, Social history, and Family history were reviewed and updated as appropriate.   Please see review of systems for further details on the patient's review from today.   Objective:   Physical Exam:  LMP 08/29/2002 (Approximate)   Physical Exam Neurological:     Mental Status: She is alert and oriented to person, place, and time.     Cranial Nerves: No dysarthria.  Psychiatric:        Attention and Perception: Attention and perception normal.        Mood and Affect: Mood normal.        Speech: Speech normal.        Behavior: Behavior is cooperative.        Thought Content: Thought content normal. Thought content is not paranoid or delusional. Thought content does not include homicidal or suicidal ideation. Thought content does not include homicidal or  suicidal plan.        Cognition and Memory: Cognition and memory normal.        Judgment: Judgment normal.     Comments: Insight intact     Lab Review:     Component Value Date/Time   NA 139 09/08/2019 1045   K 4.5 09/08/2019 1045   CL 101 09/08/2019 1045   CO2 27 09/08/2019 1045   GLUCOSE 103 (H) 09/08/2019 1045   GLUCOSE 104 (H) 01/08/2019 1125   BUN 11 09/08/2019 1045   CREATININE 0.94 09/08/2019 1045   CREATININE 0.87 07/16/2016 1455   CALCIUM 9.4 09/08/2019 1045   PROT 6.6 09/08/2019 1045   ALBUMIN 4.2 09/08/2019 1045   AST 19 09/08/2019 1045   ALT 15 09/08/2019 1045   ALKPHOS 81 09/08/2019 1045   BILITOT 0.4 09/08/2019 1045   GFRNONAA 62 09/08/2019 1045   GFRAA 72 09/08/2019 1045       Component Value Date/Time   WBC 7.2 09/08/2019 1045   WBC 7.7 07/16/2016 1455   RBC 4.95 09/08/2019 1045   RBC 4.63 07/16/2016 1455   HGB 13.7 09/08/2019 1045   HGB 13.7 02/11/2014 1304   HCT 41.9 09/08/2019 1045   PLT 340 09/08/2019 1045   MCV 85 09/08/2019 1045   MCH 27.7 09/08/2019 1045   MCH 28.1 07/16/2016 1455   MCHC 32.7 09/08/2019 1045   MCHC 33.2 07/16/2016 1455   RDW 13.1 09/08/2019 1045   LYMPHSABS 2,695 07/16/2016 1455   MONOABS 539 07/16/2016 1455   EOSABS 77 07/16/2016 1455   BASOSABS 0 07/16/2016 1455    No results found for: POCLITH, LITHIUM   No results found for: PHENYTOIN, PHENOBARB, VALPROATE, CBMZ   .res Assessment: Plan:   Will continue current plan of care since target signs and  symptoms are well controlled without any tolerability issues. Continue Cymbalta 90 mg daily for depression since depressive signs and symptoms remain well controlled. Continue Wellbutrin XL 150 mg daily for depression. Continue trazodone 50 mg 1/2 to 1 tablet at bedtime as needed for insomnia. Patient to follow-up in 1 year or sooner if clinically indicated. Patient advised to contact office with any questions, adverse effects, or acute worsening in signs and  symptoms.  Jazilyn was seen today for follow-up.  Diagnoses and all orders for this visit:  Major depressive disorder, recurrent episode, in full remission ([place]) -     buPROPion (WELLBUTRIN XL) 150 MG 24 hr tablet; Take 1 tablet (150 mg total) by mouth daily. -     DULoxetine (CYMBALTA) 30 MG capsule; TAKE 1 CAPSULES BY MOUTH DAILY WITH 60MG  CAPSULE TO EQUAL TOTAL DAILY DOSE OF 90MG  -     DULoxetine (CYMBALTA) 60 MG capsule; TAKE 1 CAPSULE BY MOUTH DAILY WITH 30MG  CAPSULE TO EQUAL TOTAL DAILY DOSE OF 90MG   Primary insomnia -     traZODone (DESYREL) 50 MG tablet; Take 1/2-1 tablet by mouth at bedtime as needed for sleep     Please see After Visit Summary for patient specific instructions.  Future Appointments  Date Time Provider [place]  09/08/2020 10:00 AM Salvadore Dom, MD [place] None    No orders of the defined types were placed in this encounter.     -------------------------------

## 2020-06-16 NOTE — Telephone Encounter (Signed)
Lori Harvey, Lori Harvey are scheduled for a virtual visit with your provider today.    Just as we do with appointments in the office, we must obtain your consent to participate.  Your consent will be active for this visit and any virtual visit you may have with one of our providers in the next 365 days.    If you have a MyChart account, I can also send a copy of this consent to you electronically.  All virtual visits are billed to your insurance company just like a traditional visit in the office.  As this is a virtual visit, video technology does not allow for your provider to perform a traditional examination.  This may limit your provider's ability to fully assess your condition.  If your provider identifies any concerns that need to be evaluated in person or the need to arrange testing such as labs, EKG, etc, we will make arrangements to do so.    Although advances in technology are sophisticated, we cannot ensure that it will always work on either your end or our end.  If the connection with a video visit is poor, we may have to switch to a telephone visit.  With either a video or telephone visit, we are not always able to ensure that we have a secure connection.   I need to obtain your verbal consent now.   Are you willing to proceed with your visit today?   Lori Harvey has provided verbal consent on 06/16/2020 for a virtual visit (video or telephone).   Thayer Headings, PMHNP 06/16/2020  9:31 AM

## 2020-07-27 ENCOUNTER — Other Ambulatory Visit: Payer: Self-pay | Admitting: Obstetrics and Gynecology

## 2020-08-29 ENCOUNTER — Telehealth: Payer: Self-pay | Admitting: Family Medicine

## 2020-08-29 NOTE — Telephone Encounter (Signed)
Left message for patient to call back and schedule Medicare Annual Wellness Visit (AWV) either virtually or in office.  Last AWV 02/17/19; please schedule at anytime with [facility] Nurse Health Advisor 1.  This should be a 45 minute visit.

## 2020-09-08 ENCOUNTER — Ambulatory Visit: Payer: Self-pay | Admitting: Obstetrics and Gynecology

## 2020-09-30 ENCOUNTER — Telehealth: Payer: Self-pay | Admitting: Family Medicine

## 2020-09-30 NOTE — Telephone Encounter (Signed)
Left message for patient to call back and schedule Medicare Annual Wellness Visit (AWV) either virtually or in office.   Last AWV 02/17/19   ; please schedule at anytime with [facility] Nurse Health Advisor 1 or 2   This should be a 45 minute visit.

## 2020-11-24 ENCOUNTER — Other Ambulatory Visit: Payer: Self-pay | Admitting: Obstetrics and Gynecology

## 2020-11-24 NOTE — Telephone Encounter (Signed)
Last AEX 09/08/2019 Scheduled for AEX 02/16/2021  BD test 02/11/2020

## 2020-12-01 ENCOUNTER — Telehealth: Payer: Self-pay | Admitting: Family Medicine

## 2020-12-01 NOTE — Progress Notes (Signed)
  Chronic Care Management   Outreach Note  12/01/2020 Name: Lori Harvey MRN: [id number] DOB: 1951/10/20  Referred by: Eulas Post, MD Reason for referral : No chief complaint on file.   An unsuccessful telephone outreach was attempted today. The patient was referred to the pharmacist for assistance with care management and care coordination.   Follow Up Plan:   [name] [employer] Scheduler

## 2020-12-15 ENCOUNTER — Telehealth: Payer: Self-pay | Admitting: Family Medicine

## 2020-12-15 NOTE — Progress Notes (Signed)
  Chronic Care Management   Outreach Note  12/15/2020 Name: Lori Harvey MRN: [id number] DOB: 07/16/1951  Referred by: Eulas Post, MD Reason for referral : No chief complaint on file.   A second unsuccessful telephone outreach was attempted today. The patient was referred to pharmacist for assistance with care management and care coordination.  Follow Up Plan:   [name] [employer] Scheduler

## 2020-12-23 ENCOUNTER — Telehealth: Payer: Self-pay | Admitting: Family Medicine

## 2020-12-23 NOTE — Progress Notes (Signed)
  Chronic Care Management   Outreach Note  12/23/2020 Name: Lori Harvey MRN: [id number] DOB: April 08, 1951  Referred by: Eulas Post, MD Reason for referral : No chief complaint on file.   Third unsuccessful telephone outreach was attempted today. The patient was referred to the pharmacist for assistance with care management and care coordination.   Follow Up Plan:   [name] [employer] Scheduler

## 2021-01-04 ENCOUNTER — Ambulatory Visit (INDEPENDENT_AMBULATORY_CARE_PROVIDER_SITE_OTHER): Payer: Medicare Other | Admitting: Pulmonary Disease

## 2021-01-04 ENCOUNTER — Encounter: Payer: Self-pay | Admitting: Pulmonary Disease

## 2021-01-04 ENCOUNTER — Other Ambulatory Visit: Payer: Self-pay

## 2021-01-04 VITALS — BP 144/82 | HR 85 | Temp 97.2°F | Ht 66.5 in | Wt 168.0 lb

## 2021-01-04 DIAGNOSIS — J310 Chronic rhinitis: Secondary | ICD-10-CM

## 2021-01-04 DIAGNOSIS — G4733 Obstructive sleep apnea (adult) (pediatric): Secondary | ICD-10-CM | POA: Diagnosis not present

## 2021-01-04 NOTE — Progress Notes (Signed)
Subjective:    Patient ID: Lori Harvey, female    DOB: 03/29/1951, 70 y.o.   MRN: [id number]  HPI  Chief Complaint  Patient presents with  . Consult    Sleep consult. Having snoring.  Sleep study completed.    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 She is widowed for 5 years so no bed partner history is available.  She presented with loud snoring which was noticed by her lady friends when she travels.  She also reported some daytime fatigue and new onset naps in the afternoons.  She has always been a light sleeper throughout her life.  She also reports perennial nasal congestion  Epworth sleepiness score is 10 and she reports some sleepiness while sitting and reading, watching TV or lying down to rest in the afternoons. She reports a 30-minute naps in the afternoons with an alarm clock which is refreshing. Bedtime is between 11 PM and midnight, sleep latency can be up to 30 minutes, she takes her trazodone about twice a week, generally sleeps on her side with 1 pillow, reports 1-2 nocturnal awakenings including nocturia and is out of bed at 7:30 AM feeling tired with dryness of mouth but denies headaches. Her weight is fluctuated within 5 pounds.  There is no history suggestive of cataplexy, sleep paralysis or parasomnias    We reviewed sleep studies  PMH -perennial nasal congestion for which she has tried a tea pot and saline, depression  Significant tests/ events reviewed  HST 02/2020 160 pounds, AHI 19/hour lowest desaturation 77%, supine AHI 57/hour, nonsupine AHI 7/4.  03/2020 CPAP titration >> 17 cm, medium full facemask   Past Medical History:  Diagnosis Date  . Cancer (HCC)    right leg, upper thigh AND CALF melanoma  . Depression   . GERD (gastroesophageal reflux disease)   . Hypertension    Patient denies  . Melanoma ([place])   . Obstructive sleep apnea   . Osteopenia   . Prediabetes   . UTI (lower urinary tract  infection)      Past Surgical History:  Procedure Laterality Date  . BREAST BIOPSY Left   . COLONOSCOPY    . MELANOMA EXCISION  2006   right thigh and calf  . REFRACTIVE SURGERY    . TONSILLECTOMY    . WISDOM TOOTH EXTRACTION      Allergies  Allergen Reactions  . Keflex [Cephalexin]     Nausea, hives  . Sulfa Antibiotics Rash    Social History   Socioeconomic History  . Marital status: Widowed    Spouse name: Not on file  . Number of children: Not on file  . Years of education: Not on file  . Highest education level: Not on file  Occupational History  . Not on file  Tobacco Use  . Smoking status: Never Smoker  . Smokeless tobacco: Never Used  Vaping Use  . Vaping Use: Never used  Substance and Sexual Activity  . Alcohol use: Yes    Alcohol/week: 1.0 - 2.0 standard drink    Types: 1 - 2 Glasses of wine per week  . Drug use: No  . Sexual activity: Not Currently    Birth control/protection: Post-menopausal  Other Topics Concern  . Not on file  Social History Narrative  . Not on file   Social Determinants of Health   Financial Resource Strain: Not on file  Food Insecurity: Not on file  Transportation Needs: Not on file  Physical Activity: Not on file  Stress: Not on file  Social Connections: Not on file  Intimate Partner Violence: Not on file     Family History  Problem Relation Age of Onset  . Cancer Mother        breast  . Stroke Mother   . Breast cancer Mother   . Depression Mother   . Alcohol abuse Father   . Heart attack Father   . Other Sister 56       colon mass, benign  . Hypertension Sister   . Colon cancer Sister 33  . Depression Sister   . Colon polyps Sister   . Prostate cancer Brother   . Other Sister 58       colon mass- not colon cancer  . Hypertension Sister   . Colon polyps Sister   . Anxiety disorder Maternal Aunt   . Esophageal cancer Neg Hx   . Rectal cancer Neg Hx   . Stomach cancer Neg Hx       Review of  Systems   Indigestion Depression Constitutional: negative for anorexia, fevers and sweats  Eyes: negative for irritation, redness and visual disturbance  Ears, nose, mouth, throat, and face: negative for earaches, epistaxis, nasal congestion and sore throat  Respiratory: negative for cough, dyspnea on exertion, sputum and wheezing  Cardiovascular: negative for chest pain, dyspnea, lower extremity edema, orthopnea, palpitations and syncope  Gastrointestinal: negative for abdominal pain, constipation, diarrhea, melena, nausea and vomiting  Genitourinary:negative for dysuria, frequency and hematuria  Hematologic/lymphatic: negative for bleeding, easy bruising and lymphadenopathy  Musculoskeletal:negative for arthralgias, muscle weakness and stiff joints  Neurological: negative for coordination problems, gait problems, headaches and weakness  Endocrine: negative for diabetic symptoms including polydipsia, polyuria and weight loss     Objective:   Physical Exam  Gen. Pleasant, elderly,well-nourished, in no distress, normal affect ENT - no pallor,icterus, no post nasal drip Neck: No JVD, no thyromegaly, no carotid bruits Lungs: no use of accessory muscles, no dullness to percussion, clear without rales or rhonchi  Cardiovascular: Rhythm regular, heart sounds  normal, no murmurs or gallops, no peripheral edema Abdomen: soft and non-tender, no hepatosplenomegaly, BS normal. Musculoskeletal: No deformities, no cyanosis or clubbing Neuro:  alert, non focal       Assessment & Plan:

## 2021-01-04 NOTE — Patient Instructions (Addendum)
  Trial of autoCPAP 8-17 with medium full face mask + humidity FLonase 1 spray each nare at bedtime

## 2021-01-04 NOTE — Assessment & Plan Note (Signed)
Surprisingly she has moderate OSA however review of her sleep study shows that this is predominantly during supine sleep.  Her nonsupine AHI is only 7/hour.  She generally sleeps on her side, during the sleep study she did sleep on her back for about 2 hours with skews the study results. However her loud snoring and excessive daytime fatigue is significant.  We discussed options including no therapy, positional therapy alone and oral appliance.  She has tried OTC oral appliance without significant benefit or improvement in her snoring. As such she was agreeable to trial of CPAP, based on titration study we will start her on auto CPAP 8 to 17 cm with medium fullface mask.  She does seem to be a mouth breather due to nasal congestion.  We will see if she feels dramatically improved after this trial if so we will continue this long-term if she is able to tolerate, if not we will predominantly focus on positional therapy

## 2021-01-04 NOTE — Assessment & Plan Note (Signed)
Trial of Flonase and if remains symptomatic, will treat for vasomotor rhinitis

## 2021-01-05 ENCOUNTER — Other Ambulatory Visit: Payer: Self-pay | Admitting: Obstetrics and Gynecology

## 2021-01-05 DIAGNOSIS — Z1231 Encounter for screening mammogram for malignant neoplasm of breast: Secondary | ICD-10-CM

## 2021-01-09 ENCOUNTER — Telehealth: Payer: Self-pay | Admitting: Family Medicine

## 2021-01-09 NOTE — Telephone Encounter (Signed)
Left message for patient to call back and schedule Medicare Annual Wellness Visit (AWV) either virtually or in office. No detailed message left    Last AWV 02/17/2019  please schedule at anytime with [facility] Nurse Health Advisor 1 or 2   This should be a 45 minute visit.

## 2021-01-12 ENCOUNTER — Ambulatory Visit: Payer: Medicare Other

## 2021-02-14 ENCOUNTER — Other Ambulatory Visit: Payer: Self-pay | Admitting: Obstetrics and Gynecology

## 2021-02-14 NOTE — Progress Notes (Signed)
70 y.o. G1P0010 Widowed White or Caucasian Not Hispanic or Latino female here for annual exam.  She would like to go over her vitamins. She also would like to know if she should continue with fosamax.  She was started on Fosamax in 11/18 for osteopenia with an elevated risk of fracture. Her DEXA improved in 4/21.   H/O vaginal atrophy, dyspareunia. No longer sexually active. She is seeing someone, they are sexually active without penetration (he has ED). They are living together (he is renting a room in her house).     Patient's last menstrual period was 08/29/2002 (approximate).          Sexually active: No.  The current method of family planning is post menopausal status.    Exercising: No.  The patient does not participate in regular exercise at present. Smoker:  no  Health Maintenance: Pap:  07/18/2017 normal with neg HR HPV,02/11/14 Neg. HR HPV:neg  History of abnormal Pap:  no MMG:  09/29/19 right breast US bi-rads category 2 benign. Mammogram scheduled in 5/22.  BMD:   02/01/20 osteopenic T-score -1.9 (improved) Colonoscopy: 01/12/2019 polyp, f/u in 5 years (+FH)  TDaP:  02/11/14  Gardasil: NA   reports that she has never smoked. She has never used smokeless tobacco. She reports current alcohol use of about 1.0 - 2.0 standard drink of alcohol per week. She reports that she does not use drugs.  Past Medical History:  Diagnosis Date  . Cancer (HCC)    right leg, upper thigh AND CALF melanoma  . Depression   . GERD (gastroesophageal reflux disease)   . Hypertension    Patient denies  . Melanoma ([place])   . Obstructive sleep apnea   . Osteopenia   . Prediabetes   . UTI (lower urinary tract infection)     Past Surgical History:  Procedure Laterality Date  . BREAST BIOPSY Left   . COLONOSCOPY    . MELANOMA EXCISION  2006   right thigh and calf  . REFRACTIVE SURGERY    . TONSILLECTOMY    . WISDOM TOOTH EXTRACTION      Current Outpatient Medications  Medication Sig Dispense  Refill  . alendronate (FOSAMAX) 70 MG tablet TAKE 1 TABLET BY MOUTH EVERY 7 (SEVEN) DAYS. TAKE WITH A FULL GLASS OF WATER ON AN EMPTY STOMACH. 12 tablet 0  . Ascorbic Acid (VITAMIN C PO) Take 1 tablet by mouth daily. Vitamin C with Zinc    . B Complex-C (SUPER B COMPLEX PO) Take by mouth.    Marland Kitchen buPROPion (WELLBUTRIN XL) 150 MG 24 hr tablet Take 1 tablet (150 mg total) by mouth daily. 90 tablet 3  . Cholecalciferol (VITAMIN D3) 125 MCG (5000 UT) CAPS     . DULoxetine (CYMBALTA) 30 MG capsule TAKE 1 CAPSULES BY MOUTH DAILY WITH 60MG  CAPSULE TO EQUAL TOTAL DAILY DOSE OF 90MG  90 capsule 3  . DULoxetine (CYMBALTA) 60 MG capsule TAKE 1 CAPSULE BY MOUTH DAILY WITH 30MG  CAPSULE TO EQUAL TOTAL DAILY DOSE OF 90MG  90 capsule 3  . Multiple Vitamin (MULTIVITAMIN) capsule Take 1 capsule by mouth daily. Juice Plus    . Omega-3 Fatty Acids (FISH OIL) 1000 MG CAPS Take by mouth.    . Probiotic Product (PROBIOTIC DAILY PO) Take by mouth.    . promethazine (PHENERGAN) 12.5 MG tablet Take 1 tablet (12.5 mg total) by mouth every 6 (six) hours as needed for nausea or vomiting. 30 tablet 0  . traZODone (DESYREL) 50 MG tablet  Take 1/2-1 tablet by mouth at bedtime as needed for sleep 90 tablet 2   No current facility-administered medications for this visit.    Family History  Problem Relation Age of Onset  . Cancer Mother        breast  . Stroke Mother   . Breast cancer Mother   . Depression Mother   . Alcohol abuse Father   . Heart attack Father   . Other Sister 43       colon mass, benign  . Hypertension Sister   . Colon cancer Sister 34  . Depression Sister   . Colon polyps Sister   . Prostate cancer Brother   . Other Sister 61       colon mass- not colon cancer  . Hypertension Sister   . Colon polyps Sister   . Anxiety disorder Maternal Aunt   . Esophageal cancer Neg Hx   . Rectal cancer Neg Hx   . Stomach cancer Neg Hx     Review of Systems  All other systems reviewed and are  negative.   Exam:   LMP 08/29/2002 (Approximate)   Weight change: @WEIGHTCHANGE @ Height:      Ht Readings from Last 3 Encounters:  01/04/21 5' 6.5" (1.689 m)  04/18/20 5\' 6"  (1.676 m)  03/07/20 5\' 6"  (1.676 m)    General appearance: alert, cooperative and appears stated age Head: Normocephalic, without obvious abnormality, atraumatic Neck: no adenopathy, supple, symmetrical, trachea midline and thyroid normal to inspection and palpation Breasts: normal appearance, no masses or tenderness Abdomen: soft, non-tender; non distended,  no masses,  no organomegaly Extremities: extremities normal, atraumatic, no cyanosis or edema Skin: Skin color, texture, turgor normal. No rashes or lesions Lymph nodes: Cervical, supraclavicular, and axillary nodes normal. No abnormal inguinal nodes palpated Neurologic: Grossly normal   Pelvic: External genitalia:  no lesions              Urethra:  normal appearing urethra with no masses, tenderness or lesions              Bartholins and Skenes: normal                 Vagina: atrophic appearing vagina with normal color and discharge, no lesions              Cervix: no lesions               Bimanual Exam:  Uterus:  normal size, contour, position, consistency, mobility, non-tender              Adnexa: no mass, fullness, tenderness               Rectovaginal: Confirms               Anus:  normal sphincter tone, no lesions  Gae Dry chaperoned for the exam.  1. Gynecologic exam normal Discussed breast self awareness Pap next year Mammogram scheduled next month Colonoscopy is UTD  2. Osteopenia with high risk of fracture DEXA due in 4/23 Continue calcium, vit d and weight bearing exercises - alendronate (FOSAMAX) 70 MG tablet; Take with a full glass of water on an empty stomach.  Dispense: 12 tablet; Refill: 3

## 2021-02-16 ENCOUNTER — Other Ambulatory Visit: Payer: Self-pay

## 2021-02-16 ENCOUNTER — Encounter: Payer: Self-pay | Admitting: Obstetrics and Gynecology

## 2021-02-16 ENCOUNTER — Ambulatory Visit: Payer: Medicare Other | Admitting: Obstetrics and Gynecology

## 2021-02-16 VITALS — BP 180/88 | HR 78 | Ht 66.5 in | Wt 163.0 lb

## 2021-02-16 DIAGNOSIS — Z01419 Encounter for gynecological examination (general) (routine) without abnormal findings: Secondary | ICD-10-CM | POA: Diagnosis not present

## 2021-02-16 DIAGNOSIS — M8580 Other specified disorders of bone density and structure, unspecified site: Secondary | ICD-10-CM

## 2021-02-16 MED ORDER — ALENDRONATE SODIUM 70 MG PO TABS: ORAL_TABLET | ORAL | 3 refills | Status: DC

## 2021-02-16 NOTE — Patient Instructions (Addendum)
Calcium 1,200 mg a day Vit D at least 800 IU a day  You can try coconut oil or Replens vaginal moisturizer for vaginal dryness.   Osteopenia  Osteopenia is a loss of thickness (density) inside the bones. Another name for osteopenia is low bone mass. Mild osteopenia is a normal part of aging. It is not a disease, and it does not cause symptoms. However, if you have osteopenia and continue to lose bone mass, you could develop a condition that causes the bones to become thin and break more easily (osteoporosis). Osteoporosis can cause you to lose some height, have back pain, and have a stooped posture. Although osteopenia is not a disease, making changes to your lifestyle and diet can help to prevent osteopenia from developing into osteoporosis. What are the causes? Osteopenia is caused by loss of calcium in the bones. Bones are constantly changing. Old bone cells are continually being replaced with new bone cells. This process builds new bone. The mineral calcium is needed to build new bone and maintain bone density. Bone density is usually highest around age 52. After that, most people's bodies cannot replace all the bone they have lost with new bone. What increases the risk? You are more likely to develop this condition if:  You are older than age 46.  You are a woman who went through menopause early.  You have a long illness that keeps you in bed.  You do not get enough exercise.  You lack certain nutrients (malnutrition).  You have an overactive thyroid gland (hyperthyroidism).  You use products that contain nicotine or tobacco, such as cigarettes, e-cigarettes and chewing tobacco, or you drink a lot of alcohol.  You are taking medicines that weaken the bones, such as steroids. What are the signs or symptoms? This condition does not cause any symptoms. You may have a slightly higher risk for bone breaks (fractures), so getting fractures more easily than normal may be an indication  of osteopenia. How is this diagnosed? This condition may be diagnosed based on an X-ray exam that measures bone density (dual-energy X-ray absorptiometry, or DEXA). This test can measure bone density in your hips, spine, and wrists. Osteopenia has no symptoms, so this condition is usually diagnosed after a routine bone density screening test is done for osteoporosis. This routine screening is usually done for:  Women who are age 53 or older.  Men who are age 15 or older. If you have risk factors for osteopenia, you may have the screening test at an earlier age. How is this treated? Making dietary and lifestyle changes can lower your risk for osteoporosis. If you have severe osteopenia that is close to becoming osteoporosis, this condition can be treated with medicines and dietary supplements such as calcium and vitamin D. These supplements help to rebuild bone density. Follow these instructions at home: Eating and drinking Eat a diet that is high in calcium and vitamin D.  Calcium is found in dairy products, beans, salmon, and leafy green vegetables like spinach and broccoli.  Look for foods that have vitamin D and calcium added to them (fortified foods), such as orange juice, cereal, and bread.   Lifestyle  Do 30 minutes or more of a weight-bearing exercise every day, such as walking, jogging, or playing a sport. These types of exercises strengthen the bones.  Do not use any products that contain nicotine or tobacco, such as cigarettes, e-cigarettes, and chewing tobacco. If you need help quitting, ask your health care provider.  Do not drink alcohol if: ? Your health care provider tells you not to drink. ? You are pregnant, may be pregnant, or are planning to become pregnant.  If you drink alcohol: ? Limit how much you use to:  0-1 drink a day for women.  0-2 drinks a day for men. ? Be aware of how much alcohol is in your drink. In the [place] one drink equals one 12 oz bottle of  beer (355 mL), one 5 oz glass of wine (148 mL), or one 1 oz glass of hard liquor (44 mL). General instructions  Take over-the-counter and prescription medicines only as told by your health care provider. These include vitamins and supplements.  Take precautions at home to lower your risk of falling, such as: ? Keeping rooms well-lit and free of clutter, such as cords. ? Installing safety rails on stairs. ? Using rubber mats in the bathroom or other areas that are often wet or slippery.  Keep all follow-up visits. This is important. Contact a health care provider if:  You have not had a bone density screening for osteoporosis and you are: ? A woman who is age 26 or older. ? A man who is age 43 or older.  You are a postmenopausal woman who has not had a bone density screening for osteoporosis.  You are older than age 11 and you want to know if you should have bone density screening for osteoporosis. Summary  Osteopenia is a loss of thickness (density) inside the bones. Another name for osteopenia is low bone mass.  Osteopenia is not a disease, but it may increase your risk for a condition that causes the bones to become thin and break more easily (osteoporosis).  You may be at risk for osteopenia if you are older than age 2 or if you are a woman who went through early menopause.  Osteopenia does not cause any symptoms, but it can be diagnosed with a bone density screening test.  Dietary and lifestyle changes are the first treatment for osteopenia. These may lower your risk for osteoporosis. This information is not intended to replace advice given to you by your health care provider. Make sure you discuss any questions you have with your health care provider. Document Revised: 03/31/2020 Document Reviewed: 03/31/2020 Elsevier Patient Education  2021 [employer]   We recommended that you start or continue a regular exercise program for good health. Physical activity is  anything that gets your body moving, some is better than none. The CDC recommends 150 minutes per week of Moderate-Intensity Aerobic Activity and 2 or more days of Muscle Strengthening Activity.  Benefits of exercise are limitless: helps weight loss/weight maintenance, improves mood and energy, helps with depression and anxiety, improves sleep, tones and strengthens muscles, improves balance, improves bone density, protects from chronic conditions such as heart disease, high blood pressure and diabetes and so much more. To learn more visit: WhyNotPoker.uy  DIET: Good nutrition starts with a healthy diet of fruits, vegetables, whole grains, and lean protein sources. Drink plenty of water for hydration. Minimize empty calories, sodium, sweets. For more information about dietary recommendations visit: GeekRegister.com.ee and http://schaefer-mitchell.com/  ALCOHOL:  Women should limit their alcohol intake to no more than 7 drinks/beers/glasses of wine (combined, not each!) per week. Moderation of alcohol intake to this level decreases your risk of breast cancer and liver damage.  If you are concerned that you may have a problem, or your friends have told you they  are concerned about your drinking, there are many resources to help. A well-known program that is free, effective, and available to all people all over the nation is Alcoholics Anonymous.  Check out this site to learn more: BlockTaxes.se   CALCIUM AND VITAMIN D:  Adequate intake of calcium and Vitamin D are recommended for bone health.  You should be getting between 1000-1200 mg of calcium and 800 units of Vitamin D daily between diet and supplements  PAP SMEARS:  Pap smears, to check for cervical cancer or precancers,  have traditionally been done yearly, scientific advances have shown that most women can have pap smears less often.  However, every woman still  should have a physical exam from her gynecologist every year. It will include a breast check, inspection of the vulva and vagina to check for abnormal growths or skin changes, a visual exam of the cervix, and then an exam to evaluate the size and shape of the uterus and ovaries. We will also provide age appropriate advice regarding health maintenance, like when you should have certain vaccines, screening for sexually transmitted diseases, bone density testing, colonoscopy, mammograms, etc.   MAMMOGRAMS:  All women over 44 years old should have a routine mammogram.   COLON CANCER SCREENING: Now recommend starting at age 15. At this time colonoscopy is not covered for routine screening until 50. There are take home tests that can be done between 45-49.   COLONOSCOPY:  Colonoscopy to screen for colon cancer is recommended for all women at age 4.  We know, you hate the idea of the prep.  We agree, BUT, having colon cancer and not knowing it is worse!!  Colon cancer so often starts as a polyp that can be seen and removed at colonscopy, which can quite literally save your life!  And if your first colonoscopy is normal and you have no family history of colon cancer, most women don't have to have it again for 10 years.  Once every ten years, you can do something that may end up saving your life, right?  We will be happy to help you get it scheduled when you are ready.  Be sure to check your insurance coverage so you understand how much it will cost.  It may be covered as a preventative service at no cost, but you should check your particular policy.      Breast Self-Awareness Breast self-awareness means being familiar with how your breasts look and feel. It involves checking your breasts regularly and reporting any changes to your health care provider. Practicing breast self-awareness is important. A change in your breasts can be a sign of a serious medical problem. Being familiar with how your breasts look and  feel allows you to find any problems early, when treatment is more likely to be successful. All women should practice breast self-awareness, including women who have had breast implants. How to do a breast self-exam One way to learn what is normal for your breasts and whether your breasts are changing is to do a breast self-exam. To do a breast self-exam: Look for Changes  1. Remove all the clothing above your waist. 2. Stand in front of a mirror in a room with good lighting. 3. Put your hands on your hips. 4. Push your hands firmly downward. 5. Compare your breasts in the mirror. Look for differences between them (asymmetry), such as: ? Differences in shape. ? Differences in size. ? Puckers, dips, and bumps in one breast and not  the other. 6. Look at each breast for changes in your skin, such as: ? Redness. ? Scaly areas. 7. Look for changes in your nipples, such as: ? Discharge. ? Bleeding. ? Dimpling. ? Redness. ? A change in position. Feel for Changes Carefully feel your breasts for lumps and changes. It is best to do this while lying on your back on the floor and again while sitting or standing in the shower or tub with soapy water on your skin. Feel each breast in the following way:  Place the arm on the side of the breast you are examining above your head.  Feel your breast with the other hand.  Start in the nipple area and make  inch (2 cm) overlapping circles to feel your breast. Use the pads of your three middle fingers to do this. Apply light pressure, then medium pressure, then firm pressure. The light pressure will allow you to feel the tissue closest to the skin. The medium pressure will allow you to feel the tissue that is a little deeper. The firm pressure will allow you to feel the tissue close to the ribs.  Continue the overlapping circles, moving downward over the breast until you feel your ribs below your breast.  Move one finger-width toward the center of the  body. Continue to use the  inch (2 cm) overlapping circles to feel your breast as you move slowly up toward your collarbone.  Continue the up and down exam using all three pressures until you reach your armpit.  Write Down What You Find  Write down what is normal for each breast and any changes that you find. Keep a written record with breast changes or normal findings for each breast. By writing this information down, you do not need to depend only on memory for size, tenderness, or location. Write down where you are in your menstrual cycle, if you are still menstruating. If you are having trouble noticing differences in your breasts, do not get discouraged. With time you will become more familiar with the variations in your breasts and more comfortable with the exam. How often should I examine my breasts? Examine your breasts every month. If you are breastfeeding, the best time to examine your breasts is after a feeding or after using a breast pump. If you menstruate, the best time to examine your breasts is 5-7 days after your period is over. During your period, your breasts are lumpier, and it may be more difficult to notice changes. When should I see my health care provider? See your health care provider if you notice:  A change in shape or size of your breasts or nipples.  A change in the skin of your breast or nipples, such as a reddened or scaly area.  Unusual discharge from your nipples.  A lump or thick area that was not there before.  Pain in your breasts.  Anything that concerns you.

## 2021-02-20 ENCOUNTER — Other Ambulatory Visit: Payer: Self-pay

## 2021-02-20 DIAGNOSIS — M8580 Other specified disorders of bone density and structure, unspecified site: Secondary | ICD-10-CM

## 2021-02-20 MED ORDER — ALENDRONATE SODIUM 70 MG PO TABS: ORAL_TABLET | ORAL | 3 refills | Status: DC

## 2021-02-20 NOTE — Telephone Encounter (Signed)
Medication refill request: fosamax Last AEX:  02-16-2021 Next AEX: not scheduled Last MMG (if hormonal medication request): n/a Refill authorized: pharmacy sent a fax stating they needed a new rx sent in for this patient for fosamax because the previous rx was missing the directions on how it was to be taken. I discontinued the other rx. Please approve this rx if appropriate.

## 2021-03-01 ENCOUNTER — Other Ambulatory Visit: Payer: Self-pay

## 2021-03-01 ENCOUNTER — Ambulatory Visit
Admission: RE | Admit: 2021-03-01 | Discharge: 2021-03-01 | Disposition: A | Discharge status: Home / Self Care | Payer: Medicare Other | Source: Ambulatory Visit | Attending: Obstetrics and Gynecology | Admitting: Obstetrics and Gynecology

## 2021-03-01 DIAGNOSIS — Z1231 Encounter for screening mammogram for malignant neoplasm of breast: Secondary | ICD-10-CM

## 2021-03-07 ENCOUNTER — Encounter: Payer: Self-pay | Admitting: Family Medicine

## 2021-03-07 ENCOUNTER — Ambulatory Visit (INDEPENDENT_AMBULATORY_CARE_PROVIDER_SITE_OTHER): Payer: Medicare Other | Admitting: Family Medicine

## 2021-03-07 ENCOUNTER — Other Ambulatory Visit: Payer: Self-pay

## 2021-03-07 VITALS — BP 158/88 | HR 87 | Temp 98.4°F | Ht 65.5 in | Wt 164.6 lb

## 2021-03-07 DIAGNOSIS — Z0000 Encounter for general adult medical examination without abnormal findings: Secondary | ICD-10-CM

## 2021-03-07 LAB — CBC WITH DIFFERENTIAL/PLATELET
Basophils Absolute: 0 10*3/uL (ref 0.0–0.1)
Basophils Relative: 0.7 % (ref 0.0–3.0)
Eosinophils Absolute: 0.1 10*3/uL (ref 0.0–0.7)
Eosinophils Relative: 1.5 % (ref 0.0–5.0)
HCT: 41.5 % (ref 36.0–46.0)
Hemoglobin: 13.7 g/dL (ref 12.0–15.0)
Lymphocytes Relative: 33.7 % (ref 12.0–46.0)
Lymphs Abs: 2.5 10*3/uL (ref 0.7–4.0)
MCHC: 33.1 g/dL (ref 30.0–36.0)
MCV: 83.9 fl (ref 78.0–100.0)
Monocytes Absolute: 0.4 10*3/uL (ref 0.1–1.0)
Monocytes Relative: 5.2 % (ref 3.0–12.0)
Neutro Abs: 4.3 10*3/uL (ref 1.4–7.7)
Neutrophils Relative %: 58.9 % (ref 43.0–77.0)
Platelets: 299 10*3/uL (ref 150.0–400.0)
RBC: 4.94 Mil/uL (ref 3.87–5.11)
RDW: 13.9 % (ref 11.5–15.5)
WBC: 7.3 10*3/uL (ref 4.0–10.5)

## 2021-03-07 LAB — LIPID PANEL
Cholesterol: 253 mg/dL — ABNORMAL HIGH (ref 0–200)
HDL: 75.9 mg/dL (ref >39.00)
LDL Cholesterol: 155 mg/dL — ABNORMAL HIGH (ref 0–99)
NonHDL: 177.41
Total CHOL/HDL Ratio: 3
Triglycerides: 110 mg/dL (ref 0.0–149.0)
VLDL: 22 mg/dL (ref 0.0–40.0)

## 2021-03-07 LAB — TSH: TSH: 3.44 u[IU]/mL (ref 0.35–4.50)

## 2021-03-07 LAB — BASIC METABOLIC PANEL
BUN: 16 mg/dL (ref 6–23)
CO2: 31 mEq/L (ref 19–32)
Calcium: 9.3 mg/dL (ref 8.4–10.5)
Chloride: 99 mEq/L (ref 96–112)
Creatinine, Ser: 0.99 mg/dL (ref 0.40–1.20)
GFR: 57.4 mL/min — ABNORMAL LOW (ref >60.00)
Glucose, Bld: 100 mg/dL — ABNORMAL HIGH (ref 70–99)
Potassium: 4.1 mEq/L (ref 3.5–5.1)
Sodium: 137 mEq/L (ref 135–145)

## 2021-03-07 LAB — HEPATIC FUNCTION PANEL
ALT: 17 U/L (ref 0–35)
AST: 19 U/L (ref 0–37)
Albumin: 4.3 g/dL (ref 3.5–5.2)
Alkaline Phosphatase: 70 U/L (ref 39–117)
Bilirubin, Direct: 0.1 mg/dL (ref 0.0–0.3)
Total Bilirubin: 0.6 mg/dL (ref 0.2–1.2)
Total Protein: 6.8 g/dL (ref 6.0–8.3)

## 2021-03-07 MED ORDER — LOSARTAN POTASSIUM 50 MG PO TABS: 50.0000 mg | ORAL_TABLET | Freq: Every day | ORAL | 1 refills | Status: DC

## 2021-03-07 NOTE — Progress Notes (Addendum)
Established Patient Office Visit  Subjective:  Patient ID: Lori Harvey, female    DOB: 04-10-1951  Age: 70 y.o. MRN: [id number]  CC:  Chief Complaint  Patient presents with  . Annual Exam    HPI Lori Harvey presents for physical exam.  She sees gynecologist regularly.  She is also followed by dermatologist.  She states she had severely dysplastic nevus removed recently from her upper abdomen.  She does have past history of melanoma as well.  Other medical problems include history of obstructive sleep apnea, hypertension, osteopenia, history of recurrent depression.  She is recently had some stress issues dealing with getting a condominium repaired down to [employer].  She had a problem with a couple of contractors.  Health maintenance reviewed  -Last DEXA scan 4/21 -Previous hepatitis C screen negative -Pneumonia vaccines complete -Tetanus due 2025 -Colonoscopy due 3/25 -Mammogram just completed 03/01/2021 -She has had previous Zostavax as well as Shingrix.  Social history-she been widowed for several years.  She retired few years ago.  Never smoked.  Alcoholic beverages about twice per week.  Family history-other had breast cancer and also history of stroke.  Her father had history of alcohol abuse and heart disease.  Sister with history of colon cancer.  She has another sister who has hypertension and history of colon polyps.  Brother with prostate cancer  The 10-year ASCVD risk score Mikey Bussing DC Brooke Bonito., et al., 2013) is: 15.7%   Values used to calculate the score:     Age: 48 years     Sex: Female     Is Non-Hispanic African American: No     Diabetic: No     Tobacco smoker: No     Systolic Blood Pressure: 841 mmHg     Is BP treated: No     HDL Cholesterol: 75.9 mg/dL     Total Cholesterol: 253 mg/dL   Past Medical History:  Diagnosis Date  . Cancer (HCC)    right leg, upper thigh AND CALF melanoma  . Depression   . GERD (gastroesophageal reflux disease)   .  Hypertension    Patient denies  . Melanoma ([place])   . Obstructive sleep apnea   . Osteopenia   . Prediabetes   . UTI (lower urinary tract infection)     Past Surgical History:  Procedure Laterality Date  . BREAST BIOPSY Left   . COLONOSCOPY    . MELANOMA EXCISION  2006   right thigh and calf  . REFRACTIVE SURGERY    . TONSILLECTOMY    . WISDOM TOOTH EXTRACTION      Family History  Problem Relation Age of Onset  . Cancer Mother        breast  . Stroke Mother   . Breast cancer Mother   . Depression Mother   . Alcohol abuse Father   . Heart attack Father   . Other Sister 76       colon mass, benign  . Hypertension Sister   . Colon cancer Sister 26  . Depression Sister   . Colon polyps Sister   . Prostate cancer Brother   . Other Sister 55       colon mass- not colon cancer  . Hypertension Sister   . Colon polyps Sister   . Anxiety disorder Maternal Aunt   . Esophageal cancer Neg Hx   . Rectal cancer Neg Hx   . Stomach cancer Neg Hx     Social History   Socioeconomic  History  . Marital status: Widowed    Spouse name: Not on file  . Number of children: Not on file  . Years of education: Not on file  . Highest education level: Not on file  Occupational History  . Not on file  Tobacco Use  . Smoking status: Never Smoker  . Smokeless tobacco: Never Used  Vaping Use  . Vaping Use: Never used  Substance and Sexual Activity  . Alcohol use: Yes    Alcohol/week: 1.0 - 2.0 standard drink    Types: 1 - 2 Glasses of wine per week  . Drug use: No  . Sexual activity: Not Currently    Birth control/protection: Post-menopausal  Other Topics Concern  . Not on file  Social History Narrative  . Not on file   Social Determinants of Health   Financial Resource Strain: Not on file  Food Insecurity: Not on file  Transportation Needs: Not on file  Physical Activity: Not on file  Stress: Not on file  Social Connections: Not on file  Intimate Partner Violence: Not on  file    Outpatient Medications Prior to Visit  Medication Sig Dispense Refill  . alendronate (FOSAMAX) 70 MG tablet Take 1 po once a week first thing in the morning with a full glass of water on an empty stomach. Stay upright for 30 minutes. 12 tablet 3  . Ascorbic Acid (VITAMIN C PO) Take 1 tablet by mouth daily. Vitamin C with Zinc    . B Complex-C (SUPER B COMPLEX PO) Take by mouth.    Marland Kitchen buPROPion (WELLBUTRIN XL) 150 MG 24 hr tablet Take 1 tablet (150 mg total) by mouth daily. 90 tablet 3  . Cholecalciferol (VITAMIN D3) 125 MCG (5000 UT) CAPS     . DULoxetine (CYMBALTA) 30 MG capsule TAKE 1 CAPSULES BY MOUTH DAILY WITH 60MG  CAPSULE TO EQUAL TOTAL DAILY DOSE OF 90MG  90 capsule 3  . DULoxetine (CYMBALTA) 60 MG capsule TAKE 1 CAPSULE BY MOUTH DAILY WITH 30MG  CAPSULE TO EQUAL TOTAL DAILY DOSE OF 90MG  90 capsule 3  . Multiple Vitamin (MULTIVITAMIN) capsule Take 1 capsule by mouth daily. Juice Plus    . Omega-3 Fatty Acids (FISH OIL) 1000 MG CAPS Take by mouth.    . traZODone (DESYREL) 50 MG tablet Take 1/2-1 tablet by mouth at bedtime as needed for sleep 90 tablet 2  . Probiotic Product (PROBIOTIC DAILY PO) Take by mouth.    . promethazine (PHENERGAN) 12.5 MG tablet Take 1 tablet (12.5 mg total) by mouth every 6 (six) hours as needed for nausea or vomiting. 30 tablet 0   No facility-administered medications prior to visit.    Allergies  Allergen Reactions  . Keflex [Cephalexin]     Nausea, hives  . Sulfa Antibiotics Rash    ROS Review of Systems  Constitutional: Negative for activity change, appetite change, fatigue, fever and unexpected weight change.  HENT: Negative for ear pain, hearing loss, sore throat and trouble swallowing.   Eyes: Negative for visual disturbance.  Respiratory: Negative for cough and shortness of breath.   Cardiovascular: Negative for chest pain and palpitations.  Gastrointestinal: Negative for abdominal pain, blood in stool, constipation and diarrhea.   Genitourinary: Negative for dysuria and hematuria.  Musculoskeletal: Negative for arthralgias, back pain and myalgias.  Skin: Negative for rash.  Neurological: Negative for dizziness, syncope and headaches.  Hematological: Negative for adenopathy.  Psychiatric/Behavioral: Negative for confusion and dysphoric mood.      Objective:  Physical Exam Vitals reviewed.  Constitutional:      General: She is not in acute distress.    Appearance: Normal appearance. She is not ill-appearing.  HENT:     Right Ear: Tympanic membrane normal.     Left Ear: Tympanic membrane normal.  Cardiovascular:     Rate and Rhythm: Normal rate and regular rhythm.  Pulmonary:     Effort: Pulmonary effort is normal.     Breath sounds: Normal breath sounds. No wheezing or rales.  Abdominal:     Comments: She has bandage across her upper abdomen from recent excisional skin surgery.  Abdominal exam was difficult because of that.  No hepatomegaly or splenomegaly noted.  Musculoskeletal:     Cervical back: Neck supple.     Right lower leg: No edema.     Left lower leg: No edema.  Lymphadenopathy:     Cervical: No cervical adenopathy.  Neurological:     General: No focal deficit present.     Mental Status: She is alert.  Psychiatric:        Mood and Affect: Mood normal.        Thought Content: Thought content normal.     BP (!) 158/88 (BP Location: Left Arm, Cuff Size: Normal)   Pulse 87   Temp 98.4 F (36.9 C) (Oral)   Ht 5' 5.5" (1.664 m)   Wt 164 lb 9.6 oz (74.7 kg)   LMP 08/29/2002 (Approximate)   SpO2 97%   BMI 26.97 kg/m  Wt Readings from Last 3 Encounters:  03/07/21 164 lb 9.6 oz (74.7 kg)  02/16/21 163 lb (73.9 kg)  01/04/21 168 lb (76.2 kg)     Health Maintenance Due  Topic Date Due  . COVID-19 Vaccine (4 - Booster for Pfizer series) 01/31/2021    There are no preventive care reminders to display for this patient.  Lab Results  Component Value Date   TSH 3.44 03/07/2021    Lab Results  Component Value Date   WBC 7.3 03/07/2021   HGB 13.7 03/07/2021   HCT 41.5 03/07/2021   MCV 83.9 03/07/2021   PLT 299.0 03/07/2021   Lab Results  Component Value Date   NA 137 03/07/2021   K 4.1 03/07/2021   CO2 31 03/07/2021   GLUCOSE 100 (H) 03/07/2021   BUN 16 03/07/2021   CREATININE 0.99 03/07/2021   BILITOT 0.6 03/07/2021   ALKPHOS 70 03/07/2021   AST 19 03/07/2021   ALT 17 03/07/2021   PROT 6.8 03/07/2021   ALBUMIN 4.3 03/07/2021   CALCIUM 9.3 03/07/2021   GFR 57.40 (L) 03/07/2021   Lab Results  Component Value Date   CHOL 253 (H) 03/07/2021   Lab Results  Component Value Date   HDL 75.90 03/07/2021   Lab Results  Component Value Date   LDLCALC 155 (H) 03/07/2021   Lab Results  Component Value Date   TRIG 110.0 03/07/2021   Lab Results  Component Value Date   CHOLHDL 3 03/07/2021   Lab Results  Component Value Date   HGBA1C 5.7 (H) 09/08/2019      Assessment & Plan:   Physical exam.  She has history of hypertension which is poorly controlled by repeat readings today.  She is also had multiple previous elevated readings both by home readings as well as recent gynecologist.  -We recommended nonpharmacologic management of regular exercise, weight control, keeping sodium intake less than 2400 mg daily -Discussed starting losartan 50 mg once daily and reassess  in 1 month.  Recheck basic metabolic panel then -Obtain screening lab work -She will continue with GYN follow-up and also yearly dermatology follow-up especially with her history of melanoma -other age and gender appropriate screenings such as mammography and colonoscopy up-to-date  Meds ordered this encounter  Medications  . losartan (COZAAR) 50 MG tablet    Sig: Take 1 tablet (50 mg total) by mouth daily.    Dispense:  30 tablet    Refill:  1    Follow-up: Return in about 3 weeks (around 03/28/2021).    Carolann Littler, MD

## 2021-03-07 NOTE — Patient Instructions (Signed)

## 2021-03-27 ENCOUNTER — Encounter: Payer: Self-pay | Admitting: Family Medicine

## 2021-03-28 ENCOUNTER — Ambulatory Visit (INDEPENDENT_AMBULATORY_CARE_PROVIDER_SITE_OTHER): Payer: Medicare Other | Admitting: Family Medicine

## 2021-03-28 ENCOUNTER — Other Ambulatory Visit: Payer: Self-pay

## 2021-03-28 ENCOUNTER — Encounter: Payer: Self-pay | Admitting: Family Medicine

## 2021-03-28 VITALS — BP 140/70 | HR 83 | Temp 98.1°F | Wt 165.4 lb

## 2021-03-28 DIAGNOSIS — I10 Essential (primary) hypertension: Secondary | ICD-10-CM | POA: Diagnosis not present

## 2021-03-28 DIAGNOSIS — M25511 Pain in right shoulder: Secondary | ICD-10-CM | POA: Diagnosis not present

## 2021-03-28 MED ORDER — LOSARTAN POTASSIUM-HCTZ 100-12.5 MG PO TABS: 1.0000 | ORAL_TABLET | Freq: Every day | ORAL | 11 refills | Status: DC

## 2021-03-28 NOTE — Patient Instructions (Signed)
Stop the Losartan 50 mg and start losartan HCTZ 100/12.5 mg one daily.

## 2021-03-28 NOTE — Progress Notes (Signed)
Established Patient Office Visit  Subjective:  Patient ID: Lori Harvey, female    DOB: 1950-12-11  Age: 70 y.o. MRN: [id number]  CC:  Chief Complaint  Patient presents with  . 2 Week Follow-up    BP, pt has BP log from home, recheck wound    HPI Lori Harvey presents for follow-up regarding hypertension.  We recently started losartan 50 mg daily.  She is having fewer headaches but blood pressure is improved minimally with the losartan.  She is not exercising much but plans to start back walking soon.  No excessive alcohol use.  No recent dizziness.  No peripheral edema issues.  Other issue is right shoulder pain.  She has had similar issues in the past.  She is worried about risk for adhesive capsulitis.  Several months of right shoulder pain with abduction and internal rotation.  Some radiation toward the deltoid region.  No cervical radiculitis symptoms.  No weakness.  No specific injury.  Pain is daily.  Past Medical History:  Diagnosis Date  . Cancer (HCC)    right leg, upper thigh AND CALF melanoma  . Depression   . GERD (gastroesophageal reflux disease)   . Hypertension    Patient denies  . Melanoma ([place])   . Obstructive sleep apnea   . Osteopenia   . Prediabetes   . UTI (lower urinary tract infection)     Past Surgical History:  Procedure Laterality Date  . BREAST BIOPSY Left   . COLONOSCOPY    . MELANOMA EXCISION  2006   right thigh and calf  . REFRACTIVE SURGERY    . TONSILLECTOMY    . WISDOM TOOTH EXTRACTION      Family History  Problem Relation Age of Onset  . Cancer Mother        breast  . Stroke Mother   . Breast cancer Mother   . Depression Mother   . Alcohol abuse Father   . Heart attack Father   . Other Sister 89       colon mass, benign  . Hypertension Sister   . Colon cancer Sister 48  . Depression Sister   . Colon polyps Sister   . Prostate cancer Brother   . Other Sister 27       colon mass- not colon cancer  . Hypertension  Sister   . Colon polyps Sister   . Anxiety disorder Maternal Aunt   . Esophageal cancer Neg Hx   . Rectal cancer Neg Hx   . Stomach cancer Neg Hx     Social History   Socioeconomic History  . Marital status: Widowed    Spouse name: Not on file  . Number of children: Not on file  . Years of education: Not on file  . Highest education level: Not on file  Occupational History  . Not on file  Tobacco Use  . Smoking status: Never Smoker  . Smokeless tobacco: Never Used  Vaping Use  . Vaping Use: Never used  Substance and Sexual Activity  . Alcohol use: Yes    Alcohol/week: 1.0 - 2.0 standard drink    Types: 1 - 2 Glasses of wine per week  . Drug use: No  . Sexual activity: Not Currently    Birth control/protection: Post-menopausal  Other Topics Concern  . Not on file  Social History Narrative  . Not on file   Social Determinants of Health   Financial Resource Strain: Not on file  Food Insecurity: Not  on file  Transportation Needs: Not on file  Physical Activity: Not on file  Stress: Not on file  Social Connections: Not on file  Intimate Partner Violence: Not on file    Outpatient Medications Prior to Visit  Medication Sig Dispense Refill  . alendronate (FOSAMAX) 70 MG tablet Take 1 po once a week first thing in the morning with a full glass of water on an empty stomach. Stay upright for 30 minutes. 12 tablet 3  . Ascorbic Acid (VITAMIN C PO) Take 1 tablet by mouth daily. Vitamin C with Zinc    . B Complex-C (SUPER B COMPLEX PO) Take by mouth.    Marland Kitchen buPROPion (WELLBUTRIN XL) 150 MG 24 hr tablet Take 1 tablet (150 mg total) by mouth daily. 90 tablet 3  . Cholecalciferol (VITAMIN D3) 125 MCG (5000 UT) CAPS     . DULoxetine (CYMBALTA) 30 MG capsule TAKE 1 CAPSULES BY MOUTH DAILY WITH 60MG  CAPSULE TO EQUAL TOTAL DAILY DOSE OF 90MG  90 capsule 3  . DULoxetine (CYMBALTA) 60 MG capsule TAKE 1 CAPSULE BY MOUTH DAILY WITH 30MG  CAPSULE TO EQUAL TOTAL DAILY DOSE OF 90MG  90 capsule  3  . Multiple Vitamin (MULTIVITAMIN) capsule Take 1 capsule by mouth daily. Juice Plus    . Omega-3 Fatty Acids (FISH OIL) 1000 MG CAPS Take by mouth.    . traZODone (DESYREL) 50 MG tablet Take 1/2-1 tablet by mouth at bedtime as needed for sleep 90 tablet 2  . losartan (COZAAR) 50 MG tablet Take 1 tablet (50 mg total) by mouth daily. 30 tablet 1  . Probiotic Product (PROBIOTIC DAILY PO) Take by mouth.    . promethazine (PHENERGAN) 12.5 MG tablet Take 1 tablet (12.5 mg total) by mouth every 6 (six) hours as needed for nausea or vomiting. 30 tablet 0   No facility-administered medications prior to visit.    Allergies  Allergen Reactions  . Keflex [Cephalexin]     Nausea, hives  . Sulfa Antibiotics Rash    ROS Review of Systems  Constitutional: Negative for chills, fatigue and fever.  Eyes: Negative for visual disturbance.  Respiratory: Negative for cough, chest tightness, shortness of breath and wheezing.   Cardiovascular: Negative for chest pain, palpitations and leg swelling.  Neurological: Negative for dizziness, seizures, syncope, weakness, light-headedness and headaches.      Objective:    Physical Exam Vitals reviewed.  Constitutional:      Appearance: Normal appearance.  Cardiovascular:     Rate and Rhythm: Normal rate and regular rhythm.  Pulmonary:     Effort: Pulmonary effort is normal.     Breath sounds: Normal breath sounds.  Musculoskeletal:     Right lower leg: No edema.     Left lower leg: No edema.     Comments: She has no localized tenderness right shoulder.  Good range of motion.  Does have some pain with internal rotation and abduction greater than 90 degrees against resistance.  Neurological:     Mental Status: She is alert.     Comments: Good strength with rotator cuff testing     BP 140/70 (BP Location: Left Arm, Patient Position: Sitting, Cuff Size: Normal)   Pulse 83   Temp 98.1 F (36.7 C) (Oral)   Wt 165 lb 6.4 oz (75 kg)   LMP 08/29/2002  (Approximate)   SpO2 97%   BMI 27.11 kg/m  Wt Readings from Last 3 Encounters:  03/28/21 165 lb 6.4 oz (75 kg)  03/07/21 164 lb  9.6 oz (74.7 kg)  02/16/21 163 lb (73.9 kg)     Health Maintenance Due  Topic Date Due  . COVID-19 Vaccine (4 - Booster for Pfizer series) 11/02/2020    There are no preventive care reminders to display for this patient.  Lab Results  Component Value Date   TSH 3.44 03/07/2021   Lab Results  Component Value Date   WBC 7.3 03/07/2021   HGB 13.7 03/07/2021   HCT 41.5 03/07/2021   MCV 83.9 03/07/2021   PLT 299.0 03/07/2021   Lab Results  Component Value Date   NA 137 03/07/2021   K 4.1 03/07/2021   CO2 31 03/07/2021   GLUCOSE 100 (H) 03/07/2021   BUN 16 03/07/2021   CREATININE 0.99 03/07/2021   BILITOT 0.6 03/07/2021   ALKPHOS 70 03/07/2021   AST 19 03/07/2021   ALT 17 03/07/2021   PROT 6.8 03/07/2021   ALBUMIN 4.3 03/07/2021   CALCIUM 9.3 03/07/2021   GFR 57.40 (L) 03/07/2021   Lab Results  Component Value Date   CHOL 253 (H) 03/07/2021   Lab Results  Component Value Date   HDL 75.90 03/07/2021   Lab Results  Component Value Date   LDLCALC 155 (H) 03/07/2021   Lab Results  Component Value Date   TRIG 110.0 03/07/2021   Lab Results  Component Value Date   CHOLHDL 3 03/07/2021   Lab Results  Component Value Date   HGBA1C 5.7 (H) 09/08/2019      Assessment & Plan:   #1 hypertension.  Recent addition of losartan 50 mg daily without much benefit thus far in blood pressure readings.  Still has several readings up 342A range systolic.  Change losartan to losartan HCTZ 100/12.5 mg 1 daily. Get back to more consistent aerobic exercise.  Recommend 1 month follow-up and will check basic metabolic panel then.  Consider addition of low-dose calcium channel blocker if not to goal at follow-up  #2 right shoulder pain.  At risk for adhesive capsulitis.  Suspect rotator cuff tendinitis syndrome. -Set up sports medicine  referral  Meds ordered this encounter  Medications  . losartan-hydrochlorothiazide (HYZAAR) 100-12.5 MG tablet    Sig: Take 1 tablet by mouth daily.    Dispense:  30 tablet    Refill:  11    Follow-up: Return in about 1 month (around 04/27/2021).    Carolann Littler, MD

## 2021-03-29 ENCOUNTER — Other Ambulatory Visit: Payer: Self-pay | Admitting: Family Medicine

## 2021-03-29 NOTE — Telephone Encounter (Signed)
Patient was seen in office Nothing further needed.

## 2021-04-06 ENCOUNTER — Ambulatory Visit: Payer: Medicare Other | Admitting: Family Medicine

## 2021-04-13 ENCOUNTER — Ambulatory Visit: Payer: Medicare Other | Admitting: Family Medicine

## 2021-04-18 ENCOUNTER — Telehealth: Payer: Self-pay | Admitting: Family Medicine
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
## 2021-04-18 NOTE — Telephone Encounter (Signed)
Left message for patient to call back and schedule Medicare Annual Wellness Visit (AWV) either virtually or in office.   Last AWV 02/17/2019 please schedule at anytime with [facility] Nurse Health Advisor 1 or 2   This should be a 45 minute visit.

## 2021-04-19 ENCOUNTER — Ambulatory Visit: Payer: Medicare Other | Admitting: Family Medicine

## 2021-04-19 ENCOUNTER — Ambulatory Visit: Payer: Self-pay

## 2021-04-19 ENCOUNTER — Other Ambulatory Visit: Payer: Self-pay

## 2021-04-19 ENCOUNTER — Encounter: Payer: Self-pay | Admitting: Family Medicine

## 2021-04-19 VITALS — BP 140/80 | HR 68 | Ht 65.5 in | Wt 166.0 lb

## 2021-04-19 DIAGNOSIS — G8929 Other chronic pain: Secondary | ICD-10-CM

## 2021-04-19 DIAGNOSIS — M25511 Pain in right shoulder: Secondary | ICD-10-CM | POA: Diagnosis not present

## 2021-04-19 MED ORDER — NITROGLYCERIN 0.2 MG/HR TD PT24: MEDICATED_PATCH | TRANSDERMAL | 1 refills | Status: DC

## 2021-04-19 NOTE — Patient Instructions (Addendum)
Thank you for coming in today.   Please perform the exercise program that we have prepared for you and gone over in detail on a daily basis.  In addition to the handout you were provided you can access your program through: [email or web address]   Your unique program code is:   [email or web address]  Please use Voltaren gel (Generic Diclofenac Gel) up to 4x daily for pain as needed.  This is available over-the-counter as both the name brand Voltaren gel and the generic diclofenac gel.   Nitroglycerin Protocol  Apply 1/4 nitroglycerin patch to affected area daily. Change position of patch within the affected area every 24 hours. You may experience a headache during the first 1-2 weeks of using the patch, these should subside. If you experience headaches after beginning nitroglycerin patch treatment, you may take your preferred over the counter pain reliever. Another side effect of the nitroglycerin patch is skin irritation or rash related to patch adhesive. Please notify our office if you develop more severe headaches or rash, and stop the patch. Tendon healing with nitroglycerin patch may require 12 to 24 weeks depending on the extent of injury. Men should not use if taking Viagra, Cialis, or Levitra.  Do not use if you have migraines or rosacea.

## 2021-05-07 ENCOUNTER — Other Ambulatory Visit: Payer: Self-pay | Admitting: Psychiatry

## 2021-05-07 DIAGNOSIS — F5101 Primary insomnia: Secondary | ICD-10-CM

## 2021-05-22 ENCOUNTER — Telehealth: Payer: Self-pay | Admitting: Family Medicine

## 2021-05-22 NOTE — Telephone Encounter (Signed)
Left message for patient to call back and schedule Medicare Annual Wellness Visit (AWV) either virtually or in office.   Last AWVI 02/17/2019 ; please schedule at anytime with [facility] Nurse Health Advisor 1 or 2   This should be a 45 minute visit.

## 2021-06-21 ENCOUNTER — Telehealth: Payer: Self-pay | Admitting: Family Medicine

## 2021-06-21 NOTE — Telephone Encounter (Signed)
Left message for patient to call back and schedule Medicare Annual Wellness Visit (AWV) either virtually or in office.   Left  my Lori Harvey number [phone]   Last AWVI  02/17/2019  please schedule at anytime with [facility] Nurse Health Advisor 1 or 2   This should be a 45 minute visit.

## 2021-07-04 ENCOUNTER — Other Ambulatory Visit: Payer: Self-pay

## 2021-07-04 ENCOUNTER — Ambulatory Visit (INDEPENDENT_AMBULATORY_CARE_PROVIDER_SITE_OTHER): Payer: Medicare Other

## 2021-07-04 DIAGNOSIS — Z0000 Encounter for general adult medical examination without abnormal findings: Secondary | ICD-10-CM

## 2021-07-04 NOTE — Progress Notes (Signed)
Virtual Visit via Telephone Note  I connected with  Lori Harvey on 07/04/21 at 11:45 AM EDT by telephone and verified that I am speaking with the correct person using two identifiers.  Location: Patient: Home Provider: Office Persons participating in the virtual visit: patient/Nurse Health Advisor   I discussed the limitations, risks, security and privacy concerns of performing an evaluation and management service by telephone and the availability of in person appointments. The patient expressed understanding and agreed to proceed.  Interactive audio and video telecommunications were attempted between this nurse and patient, however failed, due to patient having technical difficulties OR patient did not have access to video capability.  We continued and completed visit with audio only.  Some vital signs may be absent or patient reported.   Willette Brace, LPN   Subjective:   Lori Harvey is a 70 y.o. female who presents for Medicare Annual (Subsequent) preventive examination.  Review of Systems     Cardiac Risk Factors include: advanced age (>94mn, >>67women);hypertension     Objective:    There were no vitals filed for this visit. There is no height or weight on file to calculate BMI.  Advanced Directives 07/04/2021 04/18/2020 12/30/2018  Does Patient Have a Medical Advance Directive? Yes Yes Yes  Type of Advance Directive Living will [place]  Copy of [place]? - No - copy requested -    Current Medications (verified) Outpatient Encounter Medications as of 07/04/2021  Medication Sig   alendronate (FOSAMAX) 70 MG tablet Take 1 po once a week first thing in the morning with a full glass of water on an empty stomach. Stay upright for 30 minutes.   B Complex-C (SUPER B COMPLEX PO) Take by mouth.   buPROPion (WELLBUTRIN XL) 150 MG 24 hr tablet Take 1 tablet (150 mg total) by  mouth daily.   DULoxetine (CYMBALTA) 30 MG capsule TAKE 1 CAPSULES BY MOUTH DAILY WITH '60MG'$  CAPSULE TO EQUAL TOTAL DAILY DOSE OF '90MG'$    DULoxetine (CYMBALTA) 60 MG capsule TAKE 1 CAPSULE BY MOUTH DAILY WITH '30MG'$  CAPSULE TO EQUAL TOTAL DAILY DOSE OF '90MG'$    losartan-hydrochlorothiazide (HYZAAR) 100-12.5 MG tablet Take 1 tablet by mouth daily.   Multiple Vitamin (MULTIVITAMIN) capsule Take 1 capsule by mouth daily. Juice Plus   nitroGLYCERIN (NITRODUR - DOSED IN MG/24 HR) 0.2 mg/hr patch Apply 1/4 patch daily to tendon for tendonitis.   Omega-3 Fatty Acids (FISH OIL) 1000 MG CAPS Take by mouth.   traZODone (DESYREL) 50 MG tablet TAKE 1/2 TO 1 TABLET BY MOUTH AT BEDTIME AS NEEDED FOR SLEEP   [DISCONTINUED] Ascorbic Acid (VITAMIN C PO) Take 1 tablet by mouth daily. Vitamin C with Zinc   [DISCONTINUED] Cholecalciferol (VITAMIN D3) 125 MCG (5000 UT) CAPS    No facility-administered encounter medications on file as of 07/04/2021.    Allergies (verified) Keflex [cephalexin] and Sulfa antibiotics   History: Past Medical History:  Diagnosis Date   [place]    right leg, upper thigh AND CALF melanoma   Depression    GERD (gastroesophageal reflux disease)    Hypertension    Patient denies   Melan[place]    Obstructive sleep apnea    Osteopenia    Prediabetes    UTI (lower urinary tract infection)    Past Surgical History:  Procedure Laterality Date   BREAST BIOPSY Left    COLONOSCOPY     MELANOMA EXCISION  2006   right thigh and calf   REFRACTIVE SURGERY     TONSILLECTOMY     WISDOM TOOTH EXTRACTION     Family History  Problem Relation Age of Onset   Cancer Mother        breast   Stroke Mother    Breast cancer Mother    Depression Mother    Alcohol abuse Father    Heart attack Father    Other Sister 25       colon mass, benign   Hypertension Sister    Colon cancer Sister 56   Depression Sister    Colon polyps Sister    Prostate cancer Brother    Other Sister 67        colon mass- not colon cancer   Hypertension Sister    Colon polyps Sister    Anxiety disorder Maternal Aunt    Esophageal cancer Neg Hx    Rectal cancer Neg Hx    Stomach cancer Neg Hx    Social History   Socioeconomic History   Marital status: Widowed    Spouse name: Not on file   Number of children: Not on file   Years of education: Not on file   Highest education level: Not on file  Occupational History   Not on file  Tobacco Use   Smoking status: Never   Smokeless tobacco: Never  Vaping Use   Vaping Use: Never used  Substance and Sexual Activity   Alcohol use: Yes    Alcohol/week: 1.0 - 2.0 standard drink    Types: 1 - 2 Glasses of wine per week   Drug use: No   Sexual activity: Not Currently    Birth control/protection: Post-menopausal  Other Topics Concern   Not on file  Social History Narrative   Not on file   Social Determinants of Health   Financial Resource Strain: Low Risk    Difficulty of Paying Living Expenses: Not hard at all  Food Insecurity: No Food Insecurity   Worried About Charity fundraiser in the Last Year: Never true   Ran Out of Food in the Last Year: Never true  Transportation Needs: No Transportation Needs   Lack of Transportation (Medical): No   Lack of Transportation (Non-Medical): No  Physical Activity: Inactive   Days of Exercise per Week: 0 days   Minutes of Exercise per Session: 0 min  Stress: Stress Concern Present   Feeling of Stress : Rather much  Social Connections: Moderately Integrated   Frequency of Communication with Friends and Family: More than three times a week   Frequency of Social Gatherings with Friends and Family: More than three times a week   Attends Religious Services: More than 4 times per year   Active Member of [employer] or Organizations: Yes   Attends Archivist Meetings: 1 to 4 times per year   Marital Status: Widowed    Tobacco Counseling Counseling given: Not Answered   Clinical  Intake:  Pre-visit preparation completed: Yes  Pain : No/denies pain     BMI - recorded: 27.2 Nutritional Status: BMI 25 -29 Overweight Nutritional Risks: None Diabetes: No  How often do you need to have someone help you when you read instructions, pamphlets, or other written materials from your doctor or pharmacy?: 1 - Never  Diabetic?No  Interpreter Needed?: No  Information entered by :: Charlott Rakes, LPN   Activities of Daily Living In your present state of health, do you have  any difficulty performing the following activities: 07/04/2021  Hearing? N  Vision? N  Difficulty concentrating or making decisions? N  Walking or climbing stairs? N  Dressing or bathing? N  Doing errands, shopping? N  Preparing Food and eating ? N  Using the Toilet? N  In the past six months, have you accidently leaked urine? N  Do you have problems with loss of bowel control? N  Managing your Medications? N  Managing your Finances? N  Housekeeping or managing your Housekeeping? N  Some recent data might be hidden    Patient Care Team: Eulas Post, MD as PCP - General (Family Medicine)  Indicate any recent Medical Services you may have received from other than [facility] providers in the past year (date may be approximate).     Assessment:   This is a routine wellness examination for Lori Harvey.  Hearing/Vision screen Hearing Screening - Comments:: Pt denies any hearing issues  Vision Screening - Comments:: Pt follows up with [facility] opthalmology with Dr Deon Pilling for annual eye exams   Dietary issues and exercise activities discussed: Current Exercise Habits: The patient does not participate in regular exercise at present   Goals Addressed             This Visit's Progress    Patient Stated       Lose weight        Depression Screen PHQ 2/9 Scores 07/04/2021 03/21/2020 02/17/2019 12/24/2014  PHQ - 2 Score 0 0 0 0    Fall Risk Fall Risk  07/04/2021 03/21/2020 12/24/2014  Falls  in the past year? 0 0 No  Number falls in past yr: 0 0 -  Injury with Fall? 0 0 -  Risk for fall due to : Impaired vision - -  Follow up Falls prevention discussed Falls evaluation completed -    FALL RISK PREVENTION PERTAINING TO THE HOME:  Any stairs in or around the home? Yes  If so, are there any without handrails? No  Home free of loose throw rugs in walkways, pet beds, electrical cords, etc? Yes  Adequate lighting in your home to reduce risk of falls? Yes   ASSISTIVE DEVICES UTILIZED TO PREVENT FALLS:  Life alert? No  Use of a cane, walker or w/c? No  Grab bars in the bathroom? Yes  Shower chair or bench in shower? Yes  Elevated toilet seat or a handicapped toilet? No   TIMED UP AND GO:  Was the test performed? No .   Cognitive Function:     6CIT Screen 07/04/2021  What Year? 0 points  What month? 0 points  What time? 0 points  Count back from 20 0 points  Months in reverse 0 points  Repeat phrase 0 points  Total Score 0    Immunizations Immunization History  Administered Date(s) Administered   Influenza Split 08/16/2014   Influenza, High Dose Seasonal PF 08/25/2015, 08/17/2019   Influenza-Unspecified 08/15/2016, 08/15/2017   PFIZER(Purple Top)SARS-COV-2 Vaccination 11/24/2019, 12/15/2019, 08/02/2020   Pneumococcal Conjugate-13 12/24/2014   Pneumococcal Polysaccharide-23 12/17/2016   Pneumococcal-Unspecified 12/17/2016   Td 04/01/2003   Tdap 02/11/2014   Zoster Recombinat (Shingrix) 02/09/2017, 04/11/2017   Zoster, Live 05/10/2010    TDAP status: Up to date  Flu Vaccine status: Due, Education has been provided regarding the importance of this vaccine. Advised may receive this vaccine at local pharmacy or Health Dept. Aware to provide a copy of the vaccination record if obtained from local pharmacy or Health Dept. Verbalized acceptance  and understanding.  Pneumococcal vaccine status: Up to date  Covid-19 vaccine status: Completed vaccines  Qualifies  for Shingles Vaccine? Yes   Zostavax completed Yes   Shingrix Completed?: Yes  Screening Tests Health Maintenance  Topic Date Due   COVID-19 Vaccine (4 - Booster for Pfizer series) 11/02/2020   INFLUENZA VACCINE  05/29/2021   MAMMOGRAM  03/01/2022   COLONOSCOPY (Pts 45-85yr Insurance coverage will need to be confirmed)  01/12/2024   TETANUS/TDAP  02/12/2024   DEXA SCAN  Completed   Hepatitis C Screening  Completed   PNA vac Low Risk Adult  Completed   Zoster Vaccines- Shingrix  Completed   HPV VACCINES  Aged Out    Health Maintenance  Health Maintenance Due  Topic Date Due   COVID-19 Vaccine (4 - Booster for P[place]series) 11/02/2020   INFLUENZA VACCINE  05/29/2021    Colorectal cancer screening: Type of screening: Colonoscopy. Completed 01/12/19. Repeat every 5 years  Mammogram status: Completed 03/01/21. Repeat every year  Bone Density status: Completed 02/11/20. Results reflect: Bone density results: OSTEOPENIA. Repeat every 2 years.  Additional Screening:  Hepatitis C Screening:  Completed 07/16/16  Vision Screening: Recommended annual ophthalmology exams for early detection of glaucoma and other disorders of the eye. Is the patient up to date with their annual eye exam?  Yes  Who is the provider or what is the name of the office in which the patient attends annual eye exams? Dr BDeon PillingIf pt is not established with a provider, would they like to be referred to a provider to establish care? No .   Dental Screening: Recommended annual dental exams for proper oral hygiene  Community Resource Referral / Chronic Care Management: CRR required this visit?  No   CCM required this visit?  No      Plan:     I have personally reviewed and noted the following in the patient's chart:   Medical and social history Use of alcohol, tobacco or illicit drugs  Current medications and supplements including opioid prescriptions.  Functional ability and status Nutritional  status Physical activity Advanced directives List of other physicians Hospitalizations, surgeries, and ER visits in previous 12 months Vitals Screenings to include cognitive, depression, and falls Referrals and appointments  In addition, I have reviewed and discussed with patient certain preventive protocols, quality metrics, and best practice recommendations. A written personalized care plan for preventive services as well as general preventive health recommendations were provided to patient.     TWillette Brace LPN   [id number]  Nurse Notes: None

## 2021-07-04 NOTE — Patient Instructions (Signed)
Lori Harvey , Thank you for taking time to come for your Medicare Wellness Visit. I appreciate your ongoing commitment to your health goals. Please review the following plan we discussed and let me know if I can assist you in the future.   Screening recommendations/referrals: Colonoscopy: Done 01/12/2019 repeat in  5 years due 01/12/24 Mammogram: Done 03/01/21 repeat every year  Bone Density: Done 02/11/20 repeat every 2 years  Recommended yearly ophthalmology/optometry visit for glaucoma screening and checkup Recommended yearly dental visit for hygiene and checkup  Vaccinations: Influenza vaccine: Due Pneumococcal vaccine: Completed  Tdap vaccine: Done 02/11/14 repeat every 10 years due 02/11/14 Shingles vaccine: Completed 4/14 & 04/11/17   Covid-19:Completed 1/26, 2/16, 07/31/20   Advanced directives: Please bring a copy of your health care power of attorney and living will to the office at your convenience.  Conditions/risks identified: Lose weight   Next appointment: Follow up in one year for your annual wellness visit    Preventive Care 65 Years and Older, Female Preventive care refers to lifestyle choices and visits with your health care provider that can promote health and wellness. What does preventive care include? A yearly physical exam. This is also called an annual well check. Dental exams once or twice a year. Routine eye exams. Ask your health care provider how often you should have your eyes checked. Personal lifestyle choices, including: Daily care of your teeth and gums. Regular physical activity. Eating a healthy diet. Avoiding tobacco and drug use. Limiting alcohol use. Practicing safe sex. Taking low-dose aspirin every day. Taking vitamin and mineral supplements as recommended by your health care provider. What happens during an annual well check? The services and screenings done by your health care provider during your annual well check will depend on your age,  overall health, lifestyle risk factors, and family history of disease. Counseling  Your health care provider may ask you questions about your: Alcohol use. Tobacco use. Drug use. Emotional well-being. Home and relationship well-being. Sexual activity. Eating habits. History of falls. Memory and ability to understand (cognition). Work and work Statistician. Reproductive health. Screening  You may have the following tests or measurements: Height, weight, and BMI. Blood pressure. Lipid and cholesterol levels. These may be checked every 5 years, or more frequently if you are over 14 years old. Skin check. Lung cancer screening. You may have this screening every year starting at age 37 if you have a 30-pack-year history of smoking and currently smoke or have quit within the past 15 years. Fecal occult blood test (FOBT) of the stool. You may have this test every year starting at age 75. Flexible sigmoidoscopy or colonoscopy. You may have a sigmoidoscopy every 5 years or a colonoscopy every 10 years starting at age 20. Hepatitis C blood test. Hepatitis B blood test. Sexually transmitted disease (STD) testing. Diabetes screening. This is done by checking your blood sugar (glucose) after you have not eaten for a while (fasting). You may have this done every 1-3 years. Bone density scan. This is done to screen for osteoporosis. You may have this done starting at age 74. Mammogram. This may be done every 1-2 years. Talk to your health care provider about how often you should have regular mammograms. Talk with your health care provider about your test results, treatment options, and if necessary, the need for more tests. Vaccines  Your health care provider may recommend certain vaccines, such as: Influenza vaccine. This is recommended every year. Tetanus, diphtheria, and acellular pertussis (Tdap,  Td) vaccine. You may need a Td booster every 10 years. Zoster vaccine. You may need this after age  53. Pneumococcal 13-valent conjugate (PCV13) vaccine. One dose is recommended after age 31. Pneumococcal polysaccharide (PPSV23) vaccine. One dose is recommended after age 79. Talk to your health care provider about which screenings and vaccines you need and how often you need them. This information is not intended to replace advice given to you by your health care provider. Make sure you discuss any questions you have with your health care provider. Document Released: 11/11/2015 Document Revised: 07/04/2016 Document Reviewed: 08/16/2015 Elsevier Interactive Patient Education  2017 [place] Prevention in the Home Falls can cause injuries. They can happen to people of all ages. There are many things you can do to make your home safe and to help prevent falls. What can I do on the outside of my home? Regularly fix the edges of walkways and driveways and fix any cracks. Remove anything that might make you trip as you walk through a door, such as a raised step or threshold. Trim any bushes or trees on the path to your home. Use bright outdoor lighting. Clear any walking paths of anything that might make someone trip, such as rocks or tools. Regularly check to see if handrails are loose or broken. Make sure that both sides of any steps have handrails. Any raised decks and porches should have guardrails on the edges. Have any leaves, snow, or ice cleared regularly. Use sand or salt on walking paths during winter. Clean up any spills in your garage right away. This includes oil or grease spills. What can I do in the bathroom? Use night lights. Install grab bars by the toilet and in the tub and shower. Do not use towel bars as grab bars. Use non-skid mats or decals in the tub or shower. If you need to sit down in the shower, use a plastic, non-slip stool. Keep the floor dry. Clean up any water that spills on the floor as soon as it happens. Remove soap buildup in the tub or shower  regularly. Attach bath mats securely with double-sided non-slip rug tape. Do not have throw rugs and other things on the floor that can make you trip. What can I do in the bedroom? Use night lights. Make sure that you have a light by your bed that is easy to reach. Do not use any sheets or blankets that are too big for your bed. They should not hang down onto the floor. Have a firm chair that has side arms. You can use this for support while you get dressed. Do not have throw rugs and other things on the floor that can make you trip. What can I do in the kitchen? Clean up any spills right away. Avoid walking on wet floors. Keep items that you use a lot in easy-to-reach places. If you need to reach something above you, use a strong step stool that has a grab bar. Keep electrical cords out of the way. Do not use floor polish or wax that makes floors slippery. If you must use wax, use non-skid floor wax. Do not have throw rugs and other things on the floor that can make you trip. What can I do with my stairs? Do not leave any items on the stairs. Make sure that there are handrails on both sides of the stairs and use them. Fix handrails that are broken or loose. Make sure that handrails are as  long as the stairways. Check any carpeting to make sure that it is firmly attached to the stairs. Fix any carpet that is loose or worn. Avoid having throw rugs at the top or bottom of the stairs. If you do have throw rugs, attach them to the floor with carpet tape. Make sure that you have a light switch at the top of the stairs and the bottom of the stairs. If you do not have them, ask someone to add them for you. What else can I do to help prevent falls? Wear shoes that: Do not have high heels. Have rubber bottoms. Are comfortable and fit you well. Are closed at the toe. Do not wear sandals. If you use a stepladder: Make sure that it is fully opened. Do not climb a closed stepladder. Make sure that  both sides of the stepladder are locked into place. Ask someone to hold it for you, if possible. Clearly mark and make sure that you can see: Any grab bars or handrails. First and last steps. Where the edge of each step is. Use tools that help you move around (mobility aids) if they are needed. These include: Canes. Walkers. Scooters. Crutches. Turn on the lights when you go into a dark area. Replace any light bulbs as soon as they burn out. Set up your furniture so you have a clear path. Avoid moving your furniture around. If any of your floors are uneven, fix them. If there are any pets around you, be aware of where they are. Review your medicines with your doctor. Some medicines can make you feel dizzy. This can increase your chance of falling. Ask your doctor what other things that you can do to help prevent falls. This information is not intended to replace advice given to you by your health care provider. Make sure you discuss any questions you have with your health care provider. Document Released: 08/11/2009 Document Revised: 03/22/2016 Document Reviewed: 11/19/2014 Elsevier Interactive Patient Education  2017 [employer].

## 2021-07-23 ENCOUNTER — Encounter: Payer: Self-pay | Admitting: Family Medicine

## 2021-07-24 NOTE — Telephone Encounter (Signed)
The recommendation is to quarantine for 5 days from the beginning of symptoms, then wear a mask the next 5 days

## 2021-07-25 NOTE — Telephone Encounter (Signed)
Please review in Dr. Erick Blinks absence.

## 2021-08-04 ENCOUNTER — Other Ambulatory Visit: Payer: Self-pay | Admitting: Psychiatry

## 2021-08-04 DIAGNOSIS — F5101 Primary insomnia: Secondary | ICD-10-CM

## 2021-08-04 DIAGNOSIS — F3342 Major depressive disorder, recurrent, in full remission: Secondary | ICD-10-CM

## 2021-08-07 NOTE — Telephone Encounter (Signed)
Please schedule appt

## 2021-08-09 NOTE — Telephone Encounter (Signed)
LVM for patient.    Last seen Aug 2021 (from what I can see).    To whoever speaks to when she calls in about her script:  Per Beth, ask the provider if it's ok for the pt to be seen if it's been longer than a year since pt was last seen.

## 2021-08-10 NOTE — Telephone Encounter (Signed)
Refills okay X 1 or until gets apt ?

## 2021-08-12 DIAGNOSIS — G4733 Obstructive sleep apnea (adult) (pediatric): Secondary | ICD-10-CM | POA: Diagnosis not present

## 2021-08-22 ENCOUNTER — Encounter: Payer: Self-pay | Admitting: Family Medicine

## 2021-08-23 DIAGNOSIS — G4733 Obstructive sleep apnea (adult) (pediatric): Secondary | ICD-10-CM | POA: Diagnosis not present

## 2021-08-24 ENCOUNTER — Other Ambulatory Visit: Payer: Self-pay | Admitting: Psychiatry

## 2021-08-24 DIAGNOSIS — F3342 Major depressive disorder, recurrent, in full remission: Secondary | ICD-10-CM

## 2021-08-24 NOTE — Telephone Encounter (Signed)
Last seen 06/16/20

## 2021-08-24 NOTE — Telephone Encounter (Signed)
LM for Jaimi to call us to make an appt

## 2021-08-24 NOTE — Telephone Encounter (Signed)
Please schedule appt

## 2021-08-29 NOTE — Telephone Encounter (Signed)
Called and LVM

## 2021-09-12 DIAGNOSIS — G4733 Obstructive sleep apnea (adult) (pediatric): Secondary | ICD-10-CM | POA: Diagnosis not present

## 2021-09-17 ENCOUNTER — Other Ambulatory Visit: Payer: Self-pay | Admitting: Psychiatry

## 2021-09-17 DIAGNOSIS — F3342 Major depressive disorder, recurrent, in full remission: Secondary | ICD-10-CM

## 2021-09-18 NOTE — Telephone Encounter (Signed)
Patient notified that Rx had been sent.

## 2021-09-18 NOTE — Telephone Encounter (Signed)
Pt lost script please just call it in her wellbutrin. She leaves to go out of town tomorrow

## 2021-09-28 DIAGNOSIS — D0439 Carcinoma in situ of skin of other parts of face: Secondary | ICD-10-CM | POA: Diagnosis not present

## 2021-09-28 DIAGNOSIS — D485 Neoplasm of uncertain behavior of skin: Secondary | ICD-10-CM | POA: Diagnosis not present

## 2021-09-28 DIAGNOSIS — D040 Carcinoma in situ of skin of lip: Secondary | ICD-10-CM | POA: Diagnosis not present

## 2021-10-12 DIAGNOSIS — G4733 Obstructive sleep apnea (adult) (pediatric): Secondary | ICD-10-CM | POA: Diagnosis not present

## 2021-10-29 HISTORY — PX: OTHER SURGICAL HISTORY: SHX169

## 2021-11-06 ENCOUNTER — Ambulatory Visit: Payer: Medicare Other | Admitting: Psychiatry

## 2021-11-06 ENCOUNTER — Other Ambulatory Visit: Payer: Self-pay | Admitting: Psychiatry

## 2021-11-06 DIAGNOSIS — F5101 Primary insomnia: Secondary | ICD-10-CM

## 2021-11-12 DIAGNOSIS — G4733 Obstructive sleep apnea (adult) (pediatric): Secondary | ICD-10-CM | POA: Diagnosis not present

## 2021-11-21 DIAGNOSIS — G4733 Obstructive sleep apnea (adult) (pediatric): Secondary | ICD-10-CM | POA: Diagnosis not present

## 2021-11-29 HISTORY — PX: OTHER SURGICAL HISTORY: SHX169

## 2021-12-05 ENCOUNTER — Encounter: Payer: Self-pay | Admitting: Family Medicine

## 2021-12-05 DIAGNOSIS — I10 Essential (primary) hypertension: Secondary | ICD-10-CM

## 2021-12-06 ENCOUNTER — Encounter: Payer: Self-pay | Admitting: Psychiatry

## 2021-12-06 ENCOUNTER — Ambulatory Visit (INDEPENDENT_AMBULATORY_CARE_PROVIDER_SITE_OTHER): Payer: Medicare Other | Admitting: Psychiatry

## 2021-12-06 ENCOUNTER — Other Ambulatory Visit: Payer: Self-pay

## 2021-12-06 DIAGNOSIS — F3342 Major depressive disorder, recurrent, in full remission: Secondary | ICD-10-CM

## 2021-12-06 MED ORDER — BUPROPION HCL ER (XL) 150 MG PO TB24: 150.0000 mg | ORAL_TABLET | Freq: Every day | ORAL | 0 refills | Status: DC

## 2021-12-06 NOTE — Progress Notes (Signed)
Lori Harvey [id number] [birth year] 71 y.o.  Subjective:   Patient ID:  Lori Harvey is a 71 y.o. (DOB June 17, 1951) female.  Chief Complaint:  Chief Complaint  Patient presents with   Follow-up    Depression, Anxiety, Insomnia     HPI Lori Harvey presents to the office today for follow-up of mood and anxiety. She reports that about 2 weeks ago she had increased anxiety and was not sure of the trigger. She had ended a relationship around the same time. She reports that at times her motivation is lower and she has to push herself. She reports that she has not been exercising and this usually helps with her energy. Notices her mood is better on sunny days and lower on rainy days. She reports that she has been less interested in things. She has been more withdrawn. Has been cancelling plans or not committing. Has noticed some anxiety periodically. She reports that she will "over-thinking things." Sleeping better. She now has a cPap and reports improved sleep quality. She reports that about 3 weeks ago she was not able to wear mask after having skin cancer removed and also having to replace mask. She will take a 1/4 of Trazodone 50 mg prn when she has insomnia. Appetite has been good. She reports that she would like to lose weight. She reports poor concentration and focus. She reports that she will forget what she went into a room to get. She reports that she has been having difficulty reading. Denies SI.   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   She reports "maybe I am burned out from all I was doing." She had a group for widows.   Lost husband 11.5 years ago.   Past Psychiatric Medication Trials: Sertraline Lexapro Celexa Cymbalta Wellbutrin Ambien Valium Trazodone  PHQ2-9    Flowsheet Row Clinical Support from 07/04/2021 in [place] at [facility] from 03/21/2020 in [place] at [facility] from 02/17/2019 in [place] at [facility] from 12/24/2014 in [place] at [facility] Total Score 0 0 0 0        Review of Systems:  Review of Systems  Musculoskeletal:  Negative for gait problem.  Neurological:  Positive for tremors.  Psychiatric/Behavioral:         Please refer to HPI   Medications: I have reviewed the patient's current medications.  Current Outpatient Medications  Medication Sig Dispense Refill   B Complex-C (SUPER B COMPLEX PO) Take by mouth.     losartan-hydrochlorothiazide (HYZAAR) 100-12.5 MG tablet Take 1 tablet by mouth daily. 30 tablet 11   Multiple Vitamin (MULTIVITAMIN) capsule Take 1 capsule by mouth daily. Juice Plus     Omega-3 Fatty Acids (FISH OIL) 1000 MG CAPS Take by mouth.     traZODone (DESYREL) 50 MG tablet TAKE 1/2 TO 1 TABLET BY MOUTH AT BEDTIME AS NEEDED FOR SLEEP 90 tablet 0   alendronate (FOSAMAX) 70 MG tablet Take 1 po once a week first thing in the morning with a full glass of water on an empty stomach. Stay upright for 30 minutes. (Patient not taking: Reported on 12/06/2021) 12 tablet 3   buPROPion (WELLBUTRIN XL) 150 MG 24 hr tablet Take 1 tablet (150 mg total) by mouth daily. 90 tablet 0   DULoxetine (CYMBALTA) 30 MG capsule TAKE 1 CAPSULES BY MOUTH DAILY WITH 60MG  CAPSULE TO  EQUAL TOTAL DAILY DOSE OF 90MG  90 capsule 3   DULoxetine (CYMBALTA) 60 MG capsule TAKE 1 CAPSULE BY MOUTH DAILY WITH 30MG  CAPSULE TO EQUAL TOTAL DAILY DOSE OF 90MG  90 capsule 3   nitroGLYCERIN (NITRODUR - DOSED IN MG/24 HR) 0.2 mg/hr patch Apply 1/4 patch daily to tendon for tendonitis. 30 patch 1   No current facility-administered medications for this visit.    Medication Side Effects: Other: Mild tremor  Allergies:  Allergies  Allergen Reactions   Keflex [Cephalexin]     Nausea, hives   Sulfa Antibiotics Rash    Past Medical History:  Diagnosis Date   Cancer ([place])    right leg,  upper thigh AND CALF melanoma   Depression    GERD (gastroesophageal reflux disease)    Hypertension    Patient denies   Melanoma ([place])    Obstructive sleep apnea    Osteopenia    Prediabetes    UTI (lower urinary tract infection)     Past Medical History, Surgical history, Social history, and Family history were reviewed and updated as appropriate.   Please see review of systems for further details on the patient's review from today.   Objective:   Physical Exam:  LMP 08/29/2002 (Approximate)   Physical Exam Constitutional:      General: She is not in acute distress. Musculoskeletal:        General: No deformity.  Neurological:     Mental Status: She is alert and oriented to person, place, and time.     Coordination: Coordination normal.  Psychiatric:        Attention and Perception: Attention and perception normal. She does not perceive auditory or visual hallucinations.        Mood and Affect: Mood is not anxious. Affect is not labile, blunt, angry or inappropriate.        Speech: Speech normal.        Behavior: Behavior normal.        Thought Content: Thought content normal. Thought content is not paranoid or delusional. Thought content does not include homicidal or suicidal ideation. Thought content does not include homicidal or suicidal plan.        Cognition and Memory: Cognition and memory normal.        Judgment: Judgment normal.     Comments: Insight intact Mildly depressed mood    Lab Review:     Component Value Date/Time   NA 137 03/07/2021 1357   NA 139 09/08/2019 1045   K 4.1 03/07/2021 1357   CL 99 03/07/2021 1357   CO2 31 03/07/2021 1357   GLUCOSE 100 (H) 03/07/2021 1357   BUN 16 03/07/2021 1357   BUN 11 09/08/2019 1045   CREATININE 0.99 03/07/2021 1357   CREATININE 0.87 07/16/2016 1455   CALCIUM 9.3 03/07/2021 1357   PROT 6.8 03/07/2021 1357   PROT 6.6 09/08/2019 1045   ALBUMIN 4.3 03/07/2021 1357   ALBUMIN 4.2 09/08/2019 1045   AST 19  03/07/2021 1357   ALT 17 03/07/2021 1357   ALKPHOS 70 03/07/2021 1357   BILITOT 0.6 03/07/2021 1357   BILITOT 0.4 09/08/2019 1045   GFRNONAA 62 09/08/2019 1045   GFRAA 72 09/08/2019 1045       Component Value Date/Time   WBC 7.3 03/07/2021 1357   RBC 4.94 03/07/2021 1357   HGB 13.7 03/07/2021 1357   HGB 13.7 09/08/2019 1045   HGB 13.7 02/11/2014 1304   HCT 41.5 03/07/2021 1357   HCT 41.9 09/08/2019  1045   PLT 299.0 03/07/2021 1357   PLT 340 09/08/2019 1045   MCV 83.9 03/07/2021 1357   MCV 85 09/08/2019 1045   MCH 27.7 09/08/2019 1045   MCH 28.1 07/16/2016 1455   MCHC 33.1 03/07/2021 1357   RDW 13.9 03/07/2021 1357   RDW 13.1 09/08/2019 1045   LYMPHSABS 2.5 03/07/2021 1357   MONOABS 0.4 03/07/2021 1357   EOSABS 0.1 03/07/2021 1357   BASOSABS 0.0 03/07/2021 1357    No results found for: POCLITH, LITHIUM   No results found for: PHENYTOIN, PHENOBARB, VALPROATE, CBMZ   .res Assessment: Plan:   Pt seen for 30 minutes and time spent discussing treatment plan. She reports that mood, energy, and motivation have recently been lower and is not sure if it is related to several stressors. Discussed treatment options and she prefers not to change medications at this time. Discussed that not using cPap for about a month due to issue with mask could have caused decreased energy and motivation and may improve now that she is able to use cPap again.  Discussed that it may be beneficial to resume Vit D since she has had a low Vitamin D in the past and this could cause lower energy, motivation, and mood.  Continue Cymbalta 90 mg po qd for anxiety and depression.  Continue Wellbutrin XL 150 mg po qd for depression. Continue Trazodone 50 mg 1/4-1 tab po QHS prn insomnia.  Patient advised to contact office with any questions, adverse effects, or acute worsening in signs and symptoms. Patient advised to contact office with any questions, adverse effects, or acute worsening in signs and  symptoms.   [place] was seen today for follow-up.  Diagnoses and all orders for this visit:  Major depressive disorder, recurrent episode, in full remission ([place]) -     buPROPion (WELLBUTRIN XL) 150 MG 24 hr tablet; Take 1 tablet (150 mg total) by mouth daily.     Please see After Visit Summary for patient specific instructions.  Future Appointments  Date Time Provider [place]  12/11/2021 10:45 AM Rigoberto Noel, MD [facility] None  03/07/2022 11:30 AM Thayer Headings, PMHNP [facility] None  07/17/2022 11:00 AM [facility] HEALTH ADVISOR 2 [facility]    No orders of the defined types were placed in this encounter.   -------------------------------

## 2021-12-11 ENCOUNTER — Ambulatory Visit: Payer: Medicare Other | Admitting: Pulmonary Disease

## 2021-12-11 ENCOUNTER — Encounter: Payer: Self-pay | Admitting: Pulmonary Disease

## 2021-12-11 ENCOUNTER — Other Ambulatory Visit: Payer: Self-pay

## 2021-12-11 VITALS — BP 146/80 | HR 80 | Temp 98.3°F | Ht 66.5 in | Wt 172.6 lb

## 2021-12-11 DIAGNOSIS — G4733 Obstructive sleep apnea (adult) (pediatric): Secondary | ICD-10-CM

## 2021-12-11 DIAGNOSIS — I10 Essential (primary) hypertension: Secondary | ICD-10-CM

## 2021-12-11 NOTE — Assessment & Plan Note (Signed)
Continue losartan, recheck blood pressure readings and discuss with PCP

## 2021-12-11 NOTE — Assessment & Plan Note (Signed)
CPAP download was reviewed which shows good control of events on average pressure of 11 cm with maximum pressure 12 cm on auto settings with minimal leak.  CPAP is certainly helped improve her daytime somnolence and fatigue and she has relatively good compliance except for the period  When she had nasal surgery. We discussed alternative therapies including dental appliance and hypoglossal nerve stimulation  Weight loss encouraged, compliance with goal of at least 4-6 hrs every night is the expectation. Advised against medications with sedative side effects Cautioned against driving when sleepy - understanding that sleepiness will vary on a day to day basis

## 2021-12-11 NOTE — Progress Notes (Signed)
° °  Subjective:    Patient ID: Lori Harvey, female    DOB: [birth year], 71 y.o.   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  She reports she has been able to use her machine, settled down with nasal pillows, wakes up feeling rested and seems to have more energy during the day.  [place].  Of time where she underwent nasal surgery for skin cancer and was unable to use her machine and she felt more tired and fatigued.  She is now back on her machine No problems with pressure, she has settled down with nasal pillows mask Blood pressure slight high today, she denies dizziness or nausea   Significant tests/ events reviewed   HST 02/2020 160 pounds, AHI 73/hour lowest desaturation 77%, supine AHI 57/hour, nonsupine AHI 7/4.   03/2020 CPAP titration >> 17 cm, medium full facemask  Review of Systems neg for any significant sore throat, dysphagia, itching, sneezing, nasal congestion or excess/ purulent secretions, fever, chills, sweats, unintended wt loss, pleuritic or exertional cp, hempoptysis, orthopnea pnd or change in chronic leg swelling. Also denies presyncope, palpitations, heartburn, abdominal pain, nausea, vomiting, diarrhea or change in bowel or urinary habits, dysuria,hematuria, rash, arthralgias, visual complaints, headache, numbness weakness or ataxia.     Objective:   Physical Exam  Gen. Pleasant, well-nourished, in no distress ENT - no thrush, no pallor/icterus,no post nasal drip Neck: No JVD, no thyromegaly, no carotid bruits Lungs: no use of accessory muscles, no dullness to percussion, clear without rales or rhonchi  Cardiovascular: Rhythm regular, heart sounds  normal, no murmurs or gallops, no peripheral edema Musculoskeletal: No deformities, no cyanosis or clubbing        Assessment & Plan:

## 2021-12-11 NOTE — Patient Instructions (Signed)
°  X cpap supplies will be renewed x  1 year

## 2021-12-13 DIAGNOSIS — G4733 Obstructive sleep apnea (adult) (pediatric): Secondary | ICD-10-CM | POA: Diagnosis not present

## 2022-01-09 NOTE — Progress Notes (Signed)
? ?Chief Complaint  ?Patient presents with  ? New Patient (Initial Visit)  ?  Cardiac risk assessment  ? ?History of Present Illness: 71 yo female with history depression, GERD, sleep apnea on CPAP and HTN here today as a new patient for the evaluation of her cardiac risk. She is close friends with Staci Righter who I also take care of. She feels well overall. Occasional palpitations. This happens once every few months and lasts for a few seconds. No chest pain or dyspnea. No LE edema. She is very active. She has a family history of stroke and heart disease.  ? ?Primary Care Physician: Eulas Post, MD ? ? ?Past Medical History:  ?Diagnosis Date  ? Cancer [facility])   ? right leg, upper thigh AND CALF melanoma  ? Depression   ? GERD (gastroesophageal reflux disease)   ? Hypertension   ? Patient denies  ? Melanoma ([place])   ? Obstructive sleep apnea   ? Osteopenia   ? Prediabetes   ? UTI (lower urinary tract infection)   ? ? ?Past Surgical History:  ?Procedure Laterality Date  ? BREAST BIOPSY Left   ? COLONOSCOPY    ? MELANOMA EXCISION  2006  ? right thigh and calf  ? REFRACTIVE SURGERY    ? TONSILLECTOMY    ? WISDOM TOOTH EXTRACTION    ? ? ?Current Outpatient Medications  ?Medication Sig Dispense Refill  ? B Complex-C (SUPER B COMPLEX PO) Take 1 capsule by mouth daily.    ? buPROPion (WELLBUTRIN XL) 150 MG 24 hr tablet Take 1 tablet (150 mg total) by mouth daily. 90 tablet 0  ? DULoxetine (CYMBALTA) 30 MG capsule TAKE 1 CAPSULES BY MOUTH DAILY WITH '60MG'$  CAPSULE TO EQUAL TOTAL DAILY DOSE OF '90MG'$  90 capsule 3  ? DULoxetine (CYMBALTA) 60 MG capsule TAKE 1 CAPSULE BY MOUTH DAILY WITH '30MG'$  CAPSULE TO EQUAL TOTAL DAILY DOSE OF '90MG'$  90 capsule 3  ? losartan-hydrochlorothiazide (HYZAAR) 100-12.5 MG tablet Take 1 tablet by mouth daily. 30 tablet 11  ? Multiple Vitamin (MULTIVITAMIN) capsule Take 1 capsule by mouth daily.    ? nitroGLYCERIN (NITRODUR - DOSED IN MG/24 HR) 0.2 mg/hr patch Place 0.2 mg onto the skin daily as  needed (as directed).    ? Omega-3 Fatty Acids (FISH OIL) 1000 MG CAPS Take 1 capsule by mouth daily.    ? traZODone (DESYREL) 50 MG tablet TAKE 1/2 TO 1 TABLET BY MOUTH AT BEDTIME AS NEEDED FOR SLEEP 90 tablet 0  ? ?No current facility-administered medications for this visit.  ? ? ?Allergies  ?Allergen Reactions  ? Keflex [Cephalexin]   ?  Nausea, hives  ? Sulfa Antibiotics Rash  ? ? ?Social History  ? ?Socioeconomic History  ? Marital status: Widowed  ?  Spouse name: Not on file  ? Number of children: 0  ? Years of education: Not on file  ? Highest education level: Not on file  ?Occupational History  ? Occupation: Retired Education officer, museum and [employer]  ?Tobacco Use  ? Smoking status: Never  ? Smokeless tobacco: Never  ?Vaping Use  ? Vaping Use: Never used  ?Substance and Sexual Activity  ? Alcohol use: Yes  ?  Alcohol/week: 1.0 - 2.0 standard drink  ?  Types: 1 - 2 Glasses of wine per week  ? Drug use: No  ? Sexual activity: Not Currently  ?  Birth control/protection: Post-menopausal  ?Other Topics Concern  ? Not on file  ?Social History  Narrative  ? Not on file  ? ?Social Determinants of Health  ? ?Financial Resource Strain: Low Risk   ? Difficulty of Paying Living Expenses: Not hard at all  ?Food Insecurity: No Food Insecurity  ? Worried About Charity fundraiser in the Last Year: Never true  ? Ran Out of Food in the Last Year: Never true  ?Transportation Needs: No Transportation Needs  ? Lack of Transportation (Medical): No  ? Lack of Transportation (Non-Medical): No  ?Physical Activity: Inactive  ? Days of Exercise per Week: 0 days  ? Minutes of Exercise per Session: 0 min  ?Stress: Stress Concern Present  ? Feeling of Stress : Rather much  ?Social Connections: Moderately Integrated  ? Frequency of Communication with Friends and Family: More than three times a week  ? Frequency of Social Gatherings with Friends and Family: More than three times a week  ? Attends Religious Services: More than 4 times  per year  ? Active Member of Clubs or Organizations: Yes  ? Attends Archivist Meetings: 1 to 4 times per year  ? Marital Status: Widowed  ?Intimate Partner Violence: Not At Risk  ? Fear of Current or Ex-Partner: No  ? Emotionally Abused: No  ? Physically Abused: No  ? Sexually Abused: No  ? ? ?Family History  ?Problem Relation Age of Onset  ? Cancer Mother   ?     breast  ? Stroke Mother   ? Breast cancer Mother   ? Depression Mother   ? Alcohol abuse Father   ? Heart attack Father   ? Other Sister 28  ?     colon mass, benign  ? Hypertension Sister   ? Colon cancer Sister 57  ? Depression Sister   ? Colon polyps Sister   ? Prostate cancer Brother   ? Other Sister 67  ?     colon mass- not colon cancer  ? Hypertension Sister   ? Colon polyps Sister   ? Anxiety disorder Maternal Aunt   ? Esophageal cancer Neg Hx   ? Rectal cancer Neg Hx   ? Stomach cancer Neg Hx   ? ? ?Review of Systems:  As stated in the HPI and otherwise negative.  ? ?BP (!) 148/80   Pulse 83   Ht '5\' 6"'$  (1.676 m)   Wt 171 lb 3.2 oz (77.7 kg)   LMP 08/29/2002 (Approximate)   SpO2 96%   BMI 27.63 kg/m?  ? ?Physical Examination: ?General: Well developed, well nourished, NAD  ?HEENT: OP clear, mucus membranes moist  ?SKIN: warm, dry. No rashes. ?Neuro: No focal deficits  ?Musculoskeletal: Muscle strength 5/5 all ext  ?Psychiatric: Mood and affect normal  ?Neck: No JVD, no carotid bruits, no thyromegaly, no lymphadenopathy.  ?Lungs:Clear bilaterally, no wheezes, rhonci, crackles ?Cardiovascular: Regular rate and rhythm. No murmurs, gallops or rubs. ?Abdomen:Soft. Bowel sounds present. Non-tender.  ?Extremities: No lower extremity edema. Pulses are 2 + in the bilateral DP/PT. ? ?EKG:  EKG is ordered today. ?The ekg ordered today demonstrates sinus ? ?Recent Labs: ?03/07/2021: ALT 17; BUN 16; Creatinine, Ser 0.99; Hemoglobin 13.7; Platelets 299.0; Potassium 4.1; Sodium 137; TSH 3.44  ? ?Lipid Panel ?   ?Component Value Date/Time  ? CHOL  253 (H) 03/07/2021 1357  ? CHOL 256 (H) 09/08/2019 1045  ? TRIG 110.0 03/07/2021 1357  ? HDL 75.90 03/07/2021 1357  ? HDL 72 09/08/2019 1045  ? CHOLHDL 3 03/07/2021 1357  ? VLDL 22.0 03/07/2021  1357  ? LDLCALC 155 (H) 03/07/2021 1357  ? LDLCALC 164 (H) 09/08/2019 1045  ? ?  ?Wt Readings from Last 3 Encounters:  ?01/10/22 171 lb 3.2 oz (77.7 kg)  ?12/11/21 172 lb 9.6 oz (78.3 kg)  ?04/19/21 166 lb (75.3 kg)  ?  ? ?Assessment and Plan:  ? ?1. Palpitations: Rare and short duration. Will arrange an echo to assess LV function to get a baseline.  ? ?2. Cardiac risk assessment: Will arrange a coronary calcium score for risk assessment. If her calcium score is abnormal, will start ASA and statin.  ? ?Current medicines are reviewed at length with the patient today.  The patient does not have concerns regarding medicines. ? ?The following changes have been made:  no change ? ?Labs/ tests ordered today include:  ? ?Orders Placed This Encounter  ?Procedures  ? CT CARDIAC SCORING (SELF PAY ONLY)  ? EKG 12-Lead  ? ECHOCARDIOGRAM COMPLETE  ? ?Disposition:   F/U with me in one year.  ? ?Signed, ?Lauree Chandler, MD ?01/10/2022 11:16 AM    ?[facility] ?[address], [place], [place]  [address] ?Phone: [phone]; Fax: [phone]  ? ? ?

## 2022-01-10 ENCOUNTER — Encounter: Payer: Self-pay | Admitting: Cardiovascular Disease

## 2022-01-10 ENCOUNTER — Other Ambulatory Visit: Payer: Self-pay

## 2022-01-10 ENCOUNTER — Ambulatory Visit: Payer: Medicare Other | Admitting: Cardiovascular Disease

## 2022-01-10 VITALS — BP 148/80 | HR 83 | Ht 66.0 in | Wt 171.2 lb

## 2022-01-10 DIAGNOSIS — R002 Palpitations: Secondary | ICD-10-CM | POA: Diagnosis not present

## 2022-01-10 DIAGNOSIS — Z7189 Other specified counseling: Secondary | ICD-10-CM | POA: Diagnosis not present

## 2022-01-10 DIAGNOSIS — G4733 Obstructive sleep apnea (adult) (pediatric): Secondary | ICD-10-CM | POA: Diagnosis not present

## 2022-01-10 NOTE — Patient Instructions (Signed)
Medication Instructions:  ?Your physician recommends that you continue on your current medications as directed. Please refer to the Current Medication list given to you today.  ? ?*If you need a refill on your cardiac medications before your next appointment, please call your pharmacy* ? ? ?Lab Work: ?None ordered  ? ?If you have labs (blood work) drawn today and your tests are completely normal, you will receive your results only by: ?MyChart Message (if you have MyChart) OR ?A paper copy in the mail ?If you have any lab test that is abnormal or we need to change your treatment, we will call you to review the results. ? ? ?Testing/Procedures: ?Your physician has requested that you have an echocardiogram. Echocardiography is a painless test that uses sound waves to create images of your heart. It provides your doctor with information about the size and shape of your heart and how well your heart?s chambers and valves are working. This procedure takes approximately one hour. There are no restrictions for this procedure. ? ?Your physician has requested that you have a ct calcium score. The out of pocket cost for this test is $99 ? ? ?Follow-Up: ?At [facility], you and your health needs are our priority.  As part of our continuing mission to provide you with exceptional heart care, we have created designated Provider Care Teams.  These Care Teams include your primary Cardiologist (physician) and Advanced Practice Providers (APPs -  Physician Assistants and Nurse Practitioners) who all work together to provide you with the care you need, when you need it. ? ?We recommend signing up for the patient portal called "MyChart".  Sign up information is provided on this After Visit Summary.  MyChart is used to connect with patients for Virtual Visits (Telemedicine).  Patients are able to view lab/test results, encounter notes, upcoming appointments, etc.  Non-urgent messages can be sent to your provider as well.   ?To learn  more about what you can do with MyChart, go to [email or web address].   ? ?Your next appointment:   ?12 month(s) ? ?The format for your next appointment:   ?In Person ? ?Provider:   ?Dr. Lauree Chandler   ? ? ?Other Instructions ?None   ?

## 2022-01-26 ENCOUNTER — Ambulatory Visit (INDEPENDENT_AMBULATORY_CARE_PROVIDER_SITE_OTHER)
Admission: RE | Admit: 2022-01-26 | Discharge: 2022-01-26 | Disposition: A | Discharge status: Home / Self Care | Payer: Self-pay | Source: Ambulatory Visit | Attending: Cardiovascular Disease | Admitting: Cardiovascular Disease

## 2022-01-26 ENCOUNTER — Ambulatory Visit (HOSPITAL_COMMUNITY): Payer: Medicare Other | Attending: Cardiology

## 2022-01-26 DIAGNOSIS — Z23 Encounter for immunization: Secondary | ICD-10-CM | POA: Diagnosis not present

## 2022-01-26 DIAGNOSIS — D485 Neoplasm of uncertain behavior of skin: Secondary | ICD-10-CM | POA: Diagnosis not present

## 2022-01-26 DIAGNOSIS — D2272 Melanocytic nevi of left lower limb, including hip: Secondary | ICD-10-CM | POA: Diagnosis not present

## 2022-01-26 DIAGNOSIS — Z86018 Personal history of other benign neoplasm: Secondary | ICD-10-CM | POA: Diagnosis not present

## 2022-01-26 DIAGNOSIS — I5030 Unspecified diastolic (congestive) heart failure: Secondary | ICD-10-CM | POA: Diagnosis not present

## 2022-01-26 DIAGNOSIS — Z7189 Other specified counseling: Secondary | ICD-10-CM

## 2022-01-26 DIAGNOSIS — D0471 Carcinoma in situ of skin of right lower limb, including hip: Secondary | ICD-10-CM | POA: Diagnosis not present

## 2022-01-26 DIAGNOSIS — I10 Essential (primary) hypertension: Secondary | ICD-10-CM | POA: Diagnosis not present

## 2022-01-26 DIAGNOSIS — R002 Palpitations: Secondary | ICD-10-CM | POA: Insufficient documentation

## 2022-01-26 DIAGNOSIS — L710 Perioral dermatitis: Secondary | ICD-10-CM | POA: Diagnosis not present

## 2022-01-26 DIAGNOSIS — L988 Other specified disorders of the skin and subcutaneous tissue: Secondary | ICD-10-CM | POA: Diagnosis not present

## 2022-01-26 DIAGNOSIS — D2271 Melanocytic nevi of right lower limb, including hip: Secondary | ICD-10-CM | POA: Diagnosis not present

## 2022-01-26 LAB — ECHOCARDIOGRAM COMPLETE
Area-P 1/2: 3.24 cm2
S' Lateral: 2.6 cm

## 2022-01-30 ENCOUNTER — Telehealth: Payer: Self-pay | Admitting: *Deleted

## 2022-01-30 DIAGNOSIS — E785 Hyperlipidemia, unspecified: Secondary | ICD-10-CM

## 2022-01-30 MED ORDER — ASPIRIN EC 81 MG PO TBEC: 81.0000 mg | DELAYED_RELEASE_TABLET | Freq: Every day | ORAL | 3 refills | Status: AC

## 2022-01-30 MED ORDER — ROSUVASTATIN CALCIUM 10 MG PO TABS: 10.0000 mg | ORAL_TABLET | Freq: Every day | ORAL | 3 refills | Status: DC

## 2022-01-30 NOTE — Telephone Encounter (Signed)
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 Given her lack of symptoms suggestive of obstructive CAD (bad blockages), I would only recommend that she start an ASA 81 mg daily and Crestor 10 mg daily. If she is agreeable, I would have her come in for lipids and LFTs in 12 weeks. cdm ?

## 2022-01-30 NOTE — Telephone Encounter (Signed)
Patient aware of results and voices understanding and agreement.  She will begin asa and crestor and return for labs in 12 weeks.  Appreciative for information provided. ?

## 2022-02-06 ENCOUNTER — Other Ambulatory Visit: Payer: Self-pay | Admitting: Psychiatry

## 2022-02-06 DIAGNOSIS — F5101 Primary insomnia: Secondary | ICD-10-CM

## 2022-03-01 DIAGNOSIS — L905 Scar conditions and fibrosis of skin: Secondary | ICD-10-CM | POA: Diagnosis not present

## 2022-03-01 DIAGNOSIS — D2372 Other benign neoplasm of skin of left lower limb, including hip: Secondary | ICD-10-CM | POA: Diagnosis not present

## 2022-03-01 DIAGNOSIS — L821 Other seborrheic keratosis: Secondary | ICD-10-CM | POA: Diagnosis not present

## 2022-03-03 ENCOUNTER — Other Ambulatory Visit: Payer: Self-pay | Admitting: Psychiatry

## 2022-03-03 DIAGNOSIS — F3342 Major depressive disorder, recurrent, in full remission: Secondary | ICD-10-CM

## 2022-03-04 NOTE — Telephone Encounter (Signed)
Has appt with Janett Billow this week ?

## 2022-03-07 ENCOUNTER — Encounter: Payer: Self-pay | Admitting: Psychiatry

## 2022-03-07 ENCOUNTER — Ambulatory Visit (INDEPENDENT_AMBULATORY_CARE_PROVIDER_SITE_OTHER): Payer: Medicare Other | Admitting: Psychiatry

## 2022-03-07 DIAGNOSIS — F3342 Major depressive disorder, recurrent, in full remission: Secondary | ICD-10-CM | POA: Diagnosis not present

## 2022-03-07 DIAGNOSIS — F5101 Primary insomnia: Secondary | ICD-10-CM | POA: Diagnosis not present

## 2022-03-07 MED ORDER — TRAZODONE HCL 50 MG PO TABS: 25.0000 mg | ORAL_TABLET | Freq: Every evening | ORAL | 1 refills | Status: DC | PRN

## 2022-03-07 MED ORDER — BUPROPION HCL ER (XL) 150 MG PO TB24: 150.0000 mg | ORAL_TABLET | Freq: Every day | ORAL | 3 refills | Status: DC

## 2022-03-07 MED ORDER — DULOXETINE HCL 60 MG PO CPEP: ORAL_CAPSULE | ORAL | 3 refills | Status: DC

## 2022-03-07 MED ORDER — DULOXETINE HCL 30 MG PO CPEP: ORAL_CAPSULE | ORAL | 3 refills | Status: DC

## 2022-03-07 NOTE — Progress Notes (Signed)
Lori Harvey ?[id number] ?1951/09/05 ?71 y.o. ? ?Subjective:  ? ?Patient ID:  Lori Harvey is a 71 y.o. (DOB 1951-08-19) female. ? ?Chief Complaint:  ?Chief Complaint  ?Patient presents with  ? Follow-up  ?  Depression, anxiety, and insomnia  ? ? ?HPI ?Lori Harvey presents to the office today for follow-up of anxiety, depression, and insomnia.  ? ?She has not been able to use cPap recently due to having skin cancer removed and then skin irritation from nasal pillows. She has been traveling and has not been able to pack her cPap. She reports taking Trazodone prn only as needed. ? ?She reports that her energy is slightly improved. She reports that her energy is ok. Mood has been "good." She reports, "I'm feeling more balanced." She reports that anxiety has improved. She reports that anxiety has been controlled. Continues to have difficulty with concentration. She reports that she is "more conscious of what it takes to concentrate." She reports some difficulty with reading. She will occ forget what she went into a room to get.  ? ?[place] at the beach more. Less stress in response to law suit.  ? ?Past Psychiatric Medication Trials: ?Sertraline ?Lexapro ?Celexa ?Cymbalta ?Wellbutrin ?Ambien ?Valium ?Trazodone ? ?PHQ2-9   ? ?Flowsheet Row Clinical Support from 07/04/2021 in [place] at [facility] from 03/21/2020 in [place] at [employer] from 02/17/2019 in [place] at [employer] from 12/24/2014 in [place] at [facility]  ?PHQ-2 Total Score 0 0 0 0  ? ?  ?  ? ?Review of Systems:  ?Review of Systems  ?Cardiovascular:   ?     Recent cardiac work-up was WNL  ?Musculoskeletal:  Negative for gait problem.  ?Neurological:  Negative for tremors.  ?Psychiatric/Behavioral:    ?     Please refer to HPI  ? ?Medications: I have reviewed the patient's current medications. ? ?Current Outpatient Medications  ?Medication Sig Dispense Refill  ? aspirin  EC 81 MG tablet Take 1 tablet (81 mg total) by mouth daily. Swallow whole. 90 tablet 3  ? B Complex-C (SUPER B COMPLEX PO) Take 1 capsule by mouth daily.    ? cholecalciferol (VITAMIN D3) 25 MCG (1000 UNIT) tablet Take 1,000 Units by mouth daily.    ? losartan-hydrochlorothiazide (HYZAAR) 100-12.5 MG tablet Take 1 tablet by mouth daily. 30 tablet 11  ? Multiple Vitamin (MULTIVITAMIN) capsule Take 1 capsule by mouth daily.    ? buPROPion (WELLBUTRIN XL) 150 MG 24 hr tablet Take 1 tablet (150 mg total) by mouth daily. 90 tablet 3  ? DULoxetine (CYMBALTA) 30 MG capsule TAKE 1 CAPSULES BY MOUTH DAILY WITH '60MG'$  CAPSULE TO EQUAL TOTAL DAILY DOSE OF '90MG'$  90 capsule 3  ? DULoxetine (CYMBALTA) 60 MG capsule TAKE 1 CAPSULE BY MOUTH DAILY WITH '30MG'$  CAPSULE TO EQUAL TOTAL DAILY DOSE OF '90MG'$  90 capsule 3  ? nitroGLYCERIN (NITRODUR - DOSED IN MG/24 HR) 0.2 mg/hr patch Place 0.2 mg onto the skin daily as needed (as directed).    ? Omega-3 Fatty Acids (FISH OIL) 1000 MG CAPS Take 1 capsule by mouth daily. (Patient not taking: Reported on 03/07/2022)    ? rosuvastatin (CRESTOR) 10 MG tablet Take 1 tablet (10 mg total) by mouth daily. (Patient not taking: Reported on 03/07/2022) 90 tablet 3  ? traZODone (DESYREL) 50 MG tablet Take 0.5-1 tablets (25-50 mg total) by mouth at bedtime as needed. for sleep 90 tablet 1  ? ?No current facility-administered medications  for this visit.  ? ? ?Medication Side Effects: None ? ?Allergies:  ?Allergies  ?Allergen Reactions  ? Keflex [Cephalexin]   ?  Nausea, hives  ? Sulfa Antibiotics Rash  ? ? ?Past Medical History:  ?Diagnosis Date  ? Cancer [facility])   ? right leg, upper thigh AND CALF melanoma  ? Depression   ? GERD (gastroesophageal reflux disease)   ? Hypertension   ? Patient denies  ? Melanoma ([place])   ? Obstructive sleep apnea   ? Osteopenia   ? Prediabetes   ? UTI (lower urinary tract infection)   ? ? ?Past Medical History, Surgical history, Social history, and Family history were reviewed and  updated as appropriate.  ? ?Please see review of systems for further details on the patient's review from today.  ? ?Objective:  ? ?Physical Exam:  ?LMP 08/29/2002 (Approximate)  ? ?Physical Exam ?Constitutional:   ?   General: She is not in acute distress. ?Musculoskeletal:     ?   General: No deformity.  ?Neurological:  ?   Mental Status: She is alert and oriented to person, place, and time.  ?   Coordination: Coordination normal.  ?Psychiatric:     ?   Attention and Perception: Attention and perception normal. She does not perceive auditory or visual hallucinations.     ?   Mood and Affect: Mood normal. Mood is not anxious or depressed. Affect is not labile, blunt, angry or inappropriate.     ?   Speech: Speech normal.     ?   Behavior: Behavior normal.     ?   Thought Content: Thought content normal. Thought content is not paranoid or delusional. Thought content does not include homicidal or suicidal ideation. Thought content does not include homicidal or suicidal plan.     ?   Cognition and Memory: Cognition and memory normal.     ?   Judgment: Judgment normal.  ?   Comments: Insight intact  ? ? ?Lab Review:  ?   ?Component Value Date/Time  ? NA 137 03/07/2021 1357  ? NA 139 09/08/2019 1045  ? K 4.1 03/07/2021 1357  ? CL 99 03/07/2021 1357  ? CO2 31 03/07/2021 1357  ? GLUCOSE 100 (H) 03/07/2021 1357  ? BUN 16 03/07/2021 1357  ? BUN 11 09/08/2019 1045  ? CREATININE 0.99 03/07/2021 1357  ? CREATININE 0.87 07/16/2016 1455  ? CALCIUM 9.3 03/07/2021 1357  ? PROT 6.8 03/07/2021 1357  ? PROT 6.6 09/08/2019 1045  ? ALBUMIN 4.3 03/07/2021 1357  ? ALBUMIN 4.2 09/08/2019 1045  ? AST 19 03/07/2021 1357  ? ALT 17 03/07/2021 1357  ? ALKPHOS 70 03/07/2021 1357  ? BILITOT 0.6 03/07/2021 1357  ? BILITOT 0.4 09/08/2019 1045  ? GFRNONAA 62 09/08/2019 1045  ? GFRAA 72 09/08/2019 1045  ? ? ?   ?Component Value Date/Time  ? WBC 7.3 03/07/2021 1357  ? RBC 4.94 03/07/2021 1357  ? HGB 13.7 03/07/2021 1357  ? HGB 13.7 09/08/2019 1045  ?  HGB 13.7 02/11/2014 1304  ? HCT 41.5 03/07/2021 1357  ? HCT 41.9 09/08/2019 1045  ? PLT 299.0 03/07/2021 1357  ? PLT 340 09/08/2019 1045  ? MCV 83.9 03/07/2021 1357  ? MCV 85 09/08/2019 1045  ? MCH 27.7 09/08/2019 1045  ? MCH 28.1 07/16/2016 1455  ? MCHC 33.1 03/07/2021 1357  ? RDW 13.9 03/07/2021 1357  ? RDW 13.1 09/08/2019 1045  ? LYMPHSABS 2.5 03/07/2021 1357  ? MONOABS 0.4  03/07/2021 1357  ? EOSABS 0.1 03/07/2021 1357  ? BASOSABS 0.0 03/07/2021 1357  ? ? ?No results found for: POCLITH, LITHIUM  ? ?No results found for: PHENYTOIN, PHENOBARB, VALPROATE, CBMZ  ? ?.res ?Assessment: Plan:   ?Pt seen for 30 minutes and time spent discussing change in mood and anxiety symptoms. She reports that she has re-started Vit D and some stressors have improved, and she is noticing some improvement in mood and anxiety s/s and symptoms are now well controlled. Discussed continuing current medications at this time and considering dose reductions in the future if appropriate.  ?Will continue Wellbutrin XL 150 mg po qd for depression.  ?Continue Cymbalta 29 m gpo qd for depression and anxiety.  ?Continue Trazodone 50 mg 1/2-1 tab po QHS prn insomnia.  ?Pt to follow-up in 6 months or sooner if clinically indicated.  ?Patient advised to contact office with any questions, adverse effects, or acute worsening in signs and symptoms. ? ? ? ?Abigial was seen today for follow-up. ? ?Diagnoses and all orders for this visit: ? ?Major depressive disorder, recurrent episode, in full remission ([place]) ?-     buPROPion (WELLBUTRIN XL) 150 MG 24 hr tablet; Take 1 tablet (150 mg total) by mouth daily. ?-     DULoxetine (CYMBALTA) 30 MG capsule; TAKE 1 CAPSULES BY MOUTH DAILY WITH '60MG'$  CAPSULE TO EQUAL TOTAL DAILY DOSE OF '90MG'$  ?-     DULoxetine (CYMBALTA) 60 MG capsule; TAKE 1 CAPSULE BY MOUTH DAILY WITH '30MG'$  CAPSULE TO EQUAL TOTAL DAILY DOSE OF '90MG'$  ? ?Primary insomnia ?-     traZODone (DESYREL) 50 MG tablet; Take 0.5-1 tablets (25-50 mg total) by  mouth at bedtime as needed. for sleep ? ?  ? ?Please see After Visit Summary for patient specific instructions. ? ?Future Appointments  ?Date Time Provider [place]  ?05/09/2022  7:30 AM [facility]

## 2022-03-13 ENCOUNTER — Other Ambulatory Visit: Payer: Self-pay | Admitting: Family Medicine

## 2022-03-19 ENCOUNTER — Other Ambulatory Visit: Payer: Self-pay

## 2022-03-19 MED ORDER — LOSARTAN POTASSIUM-HCTZ 100-12.5 MG PO TABS
ORAL_TABLET | ORAL | 0 refills | Status: DC
Start: 2022-03-19 — End: 2022-05-07

## 2022-04-10 ENCOUNTER — Other Ambulatory Visit: Payer: Self-pay | Admitting: Obstetrics and Gynecology

## 2022-04-10 DIAGNOSIS — Z1231 Encounter for screening mammogram for malignant neoplasm of breast: Secondary | ICD-10-CM

## 2022-04-13 ENCOUNTER — Encounter: Payer: Self-pay | Admitting: Family Medicine

## 2022-04-13 ENCOUNTER — Ambulatory Visit (INDEPENDENT_AMBULATORY_CARE_PROVIDER_SITE_OTHER): Payer: Medicare Other | Admitting: Family Medicine

## 2022-04-13 ENCOUNTER — Ambulatory Visit
Admission: RE | Admit: 2022-04-13 | Discharge: 2022-04-13 | Disposition: A | Discharge status: Home / Self Care | Payer: Medicare Other | Source: Ambulatory Visit

## 2022-04-13 ENCOUNTER — Encounter: Payer: Self-pay | Admitting: Cardiovascular Disease

## 2022-04-13 VITALS — BP 150/80 | HR 80 | Temp 97.7°F | Ht 66.0 in | Wt 167.1 lb

## 2022-04-13 DIAGNOSIS — I10 Essential (primary) hypertension: Secondary | ICD-10-CM

## 2022-04-13 DIAGNOSIS — Z1231 Encounter for screening mammogram for malignant neoplasm of breast: Secondary | ICD-10-CM

## 2022-04-13 MED ORDER — AMLODIPINE BESYLATE 2.5 MG PO TABS
2.5000 mg | ORAL_TABLET | Freq: Every day | ORAL | 3 refills | Status: DC
Start: 2022-04-13 — End: 2023-04-01

## 2022-04-13 NOTE — Progress Notes (Signed)
Established Patient Office Visit  Subjective   Patient ID: Lori Harvey, female    DOB: November 16, 1950  Age: 71 y.o. MRN: [id number]  Chief Complaint  Patient presents with   Medication Consultation   Hypertension         HPI   Lori Harvey is seen for follow-up hypertension.  She takes losartan HCTZ and compliant with therapy.  She has recently started walking 3 days/week for exercise.  She did see cardiology during the past year and had a elevated coronary calcium score over 800 and most of this was confined to the right coronary artery.  She was started on Crestor but only took this for a few weeks and had increased fatigue issues and took herself off.  She does have pending follow-up later this summer with cardiology.  No recent chest pains.  Home blood pressures 160F to 093A systolic.  Diastolics usually fairly well controlled.  No regular alcohol use.  Past Medical History:  Diagnosis Date   Cancer ([place])    right leg, upper thigh AND CALF melanoma   Depression    GERD (gastroesophageal reflux disease)    Hypertension    Patient denies   Melanoma ([place])    Obstructive sleep apnea    Osteopenia    Prediabetes    UTI (lower urinary tract infection)    Past Surgical History:  Procedure Laterality Date   BREAST BIOPSY Left    COLONOSCOPY     MELANOMA EXCISION  2006   right thigh and calf   REFRACTIVE SURGERY     TONSILLECTOMY     WISDOM TOOTH EXTRACTION      reports that she has never smoked. She has never used smokeless tobacco. She reports current alcohol use of about 1.0 - 2.0 standard drink of alcohol per week. She reports that she does not use drugs. family history includes Alcohol abuse in her father; Anxiety disorder in her maternal aunt; Breast cancer in her mother; Cancer in her mother; Colon cancer (age of onset: 92) in her sister; Colon polyps in her sister and sister; Depression in her mother and sister; Heart attack in her father; Hypertension in her sister and  sister; Other (age of onset: 27) in her sister; Other (age of onset: 66) in her sister; Prostate cancer in her brother; Stroke in her mother. Allergies  Allergen Reactions   Keflex [Cephalexin]     Nausea, hives   Sulfa Antibiotics Rash    Review of Systems  Constitutional:  Negative for malaise/fatigue.  Eyes:  Negative for blurred vision.  Respiratory:  Negative for shortness of breath.   Cardiovascular:  Negative for chest pain.  Neurological:  Negative for dizziness, weakness and headaches.      Objective:     BP (!) 150/80 (BP Location: Left Arm, Patient Position: Sitting, Cuff Size: Normal)   Pulse 80   Temp 97.7 F (36.5 C) (Oral)   Ht '5\' 6"'$  (1.676 m)   Wt 167 lb 1.6 oz (75.8 kg)   LMP 08/29/2002 (Approximate)   SpO2 98%   BMI 26.97 kg/m    Physical Exam Constitutional:      Appearance: She is well-developed.  Eyes:     Pupils: Pupils are equal, round, and reactive to light.  Neck:     Thyroid: No thyromegaly.     Vascular: No JVD.  Cardiovascular:     Rate and Rhythm: Normal rate and regular rhythm.     Heart sounds:     No gallop.  Pulmonary:     Effort: Pulmonary effort is normal. No respiratory distress.     Breath sounds: Normal breath sounds. No wheezing or rales.  Musculoskeletal:     Cervical back: Neck supple.     Right lower leg: No edema.     Left lower leg: No edema.  Neurological:     Mental Status: She is alert.      No results found for any visits on 04/13/22.    The 10-year ASCVD risk score (Arnett DK, et al., 2019) is: 21%    Assessment & Plan:   Hypertension.  Suboptimally controlled.  Add low-dose amlodipine 2.5 mg daily to her losartan HCTZ.  Handout on DASH diet given.  Regular aerobic activity as tolerated.  Recommend continue close follow-up with cardiology regarding her dyslipidemia.  We did mention that she may be a candidate for another statin or possibly PCSK9 medication  Return in about 1 month (around  05/13/2022).    Carolann Littler, MD

## 2022-04-16 MED ORDER — ROSUVASTATIN CALCIUM 5 MG PO TABS: 5.0000 mg | ORAL_TABLET | ORAL | 3 refills | Status: AC

## 2022-04-16 NOTE — Telephone Encounter (Signed)
Burnell Blanks, MD  01/29/2022  8:32 AM EDT     Abnormal coronary calcium score suggesting that she has hardening of her arteries. Given her lack of symptoms suggestive of obstructive CAD (bad blockages), I would only recommend that she start an ASA 81 mg daily and Crestor 10 mg daily. If she is agreeable, I would have her come in for lipids and LFTs in 12 weeks. cdm    Will route to Dr. Angelena Form for recommendations.

## 2022-04-18 DIAGNOSIS — G4733 Obstructive sleep apnea (adult) (pediatric): Secondary | ICD-10-CM | POA: Diagnosis not present

## 2022-05-02 ENCOUNTER — Encounter: Payer: Self-pay | Admitting: Obstetrics and Gynecology

## 2022-05-02 ENCOUNTER — Ambulatory Visit: Payer: Medicare Other | Admitting: Obstetrics and Gynecology

## 2022-05-02 VITALS — BP 144/72 | HR 72 | Ht 66.0 in

## 2022-05-02 DIAGNOSIS — N952 Postmenopausal atrophic vaginitis: Secondary | ICD-10-CM | POA: Diagnosis not present

## 2022-05-02 DIAGNOSIS — R5383 Other fatigue: Secondary | ICD-10-CM

## 2022-05-02 DIAGNOSIS — N950 Postmenopausal bleeding: Secondary | ICD-10-CM | POA: Diagnosis not present

## 2022-05-02 DIAGNOSIS — N898 Other specified noninflammatory disorders of vagina: Secondary | ICD-10-CM

## 2022-05-02 LAB — WET PREP FOR TRICH, YEAST, CLUE

## 2022-05-02 NOTE — Progress Notes (Signed)
GYNECOLOGY  VISIT   HPI: 71 y.o.   Widowed White or Caucasian Not Hispanic or Latino  female   [id number] with Patient's last menstrual period was 08/29/2002 (approximate).   here for vaginal discharge, small amount of light brown d/c at the end of the day. It has been happening intermittently for about 4-6 weeks. No itching, burning or irritation. She is also having vaginal dryness. H/O vaginal atrophy.  Her partner is having treatment for ED, may be trying to have intercourse again. In the past it was painful.   She c/o a couple of month h/o fatigue. No other thyroid c/o.    GYNECOLOGIC HISTORY: Patient's last menstrual period was 08/29/2002 (approximate). Contraception:PMP  Menopausal hormone therapy: none         OB History     Gravida  1   Para  0   Term  0   Preterm  0   AB  1   Living  0      SAB  0   IAB  1   Ectopic  0   Multiple  0   Live Births  0              Patient Active Problem List   Diagnosis Date Noted   OSA (obstructive sleep apnea) 01/04/2021   Perennial non-allergic rhinitis 01/04/2021   Prediabetes    Major depressive disorder, recurrent episode, mild ([place]) 08/24/2018   Major depressive disorder, recurrent episode, in full remission ([place]) 07/14/2018   Pain in joint of right elbow 12/04/2017   Osteopenia 02/11/2014   Benign paroxysmal positional vertigo 02/11/2014   Melanoma ([place]) 03/31/2012   History of depression 03/31/2012   Essential hypertension 03/31/2012    Past Medical History:  Diagnosis Date   Cancer ([place])    right leg, upper thigh AND CALF melanoma   Depression    GERD (gastroesophageal reflux disease)    Hypertension    Patient denies   Melanoma ([place])    Obstructive sleep apnea    Osteopenia    Prediabetes    UTI (lower urinary tract infection)     Past Surgical History:  Procedure Laterality Date   BREAST BIOPSY Left    COLONOSCOPY     MELANOMA EXCISION  2006   right thigh and calf   REFRACTIVE SURGERY      TONSILLECTOMY     WISDOM TOOTH EXTRACTION      Current Outpatient Medications  Medication Sig Dispense Refill   amLODipine (NORVASC) 2.5 MG tablet Take 1 tablet (2.5 mg total) by mouth daily. 90 tablet 3   aspirin EC 81 MG tablet Take 1 tablet (81 mg total) by mouth daily. Swallow whole. 90 tablet 3   B Complex-C (SUPER B COMPLEX PO) Take 1 capsule by mouth daily.     buPROPion (WELLBUTRIN XL) 150 MG 24 hr tablet Take 1 tablet (150 mg total) by mouth daily. 90 tablet 3   cholecalciferol (VITAMIN D3) 25 MCG (1000 UNIT) tablet Take 1,000 Units by mouth daily.     DULoxetine (CYMBALTA) 30 MG capsule TAKE 1 CAPSULES BY MOUTH DAILY WITH '60MG'$  CAPSULE TO EQUAL TOTAL DAILY DOSE OF '90MG'$  90 capsule 3   DULoxetine (CYMBALTA) 60 MG capsule TAKE 1 CAPSULE BY MOUTH DAILY WITH '30MG'$  CAPSULE TO EQUAL TOTAL DAILY DOSE OF '90MG'$  90 capsule 3   losartan-hydrochlorothiazide (HYZAAR) 100-12.5 MG tablet Take 1 tablet by mouth daily. 30 tablet 0   Multiple Vitamin (MULTIVITAMIN) capsule Take 1 capsule by mouth  daily.     rosuvastatin (CRESTOR) 5 MG tablet Take 1 tablet (5 mg total) by mouth every other day. 45 tablet 3   traZODone (DESYREL) 50 MG tablet Take 0.5-1 tablets (25-50 mg total) by mouth at bedtime as needed. for sleep 90 tablet 1   No current facility-administered medications for this visit.     ALLERGIES: Keflex [cephalexin] and Sulfa antibiotics  Family History  Problem Relation Age of Onset   Cancer Mother        breast   Stroke Mother    Breast cancer Mother    Depression Mother    Alcohol abuse Father    Heart attack Father    Other Sister 51       colon mass, benign   Hypertension Sister    Colon cancer Sister 66   Depression Sister    Colon polyps Sister    Prostate cancer Brother    Other Sister 80       colon mass- not colon cancer   Hypertension Sister    Colon polyps Sister    Anxiety disorder Maternal Aunt    Esophageal cancer Neg Hx    Rectal cancer Neg Hx    Stomach  cancer Neg Hx     Social History   Socioeconomic History   Marital status: Widowed    Spouse name: Not on file   Number of children: 0   Years of education: Not on file   Highest education level: Bachelor's degree (e.g., BA, AB, BS)  Occupational History   Occupation: Retired Education officer, museum and [place] Use   Smoking status: Never   Smokeless tobacco: Never  Vaping Use   Vaping Use: Never used  Substance and Sexual Activity   Alcohol use: Yes    Alcohol/week: 1.0 - 2.0 standard drink of alcohol    Types: 1 - 2 Glasses of wine per week   Drug use: No   Sexual activity: Not Currently    Birth control/protection: Post-menopausal  Other Topics Concern   Not on file  Social History Narrative   Not on file   Social Determinants of Health   Financial Resource Strain: Low Risk  (04/09/2022)   Overall Financial Resource Strain (CARDIA)    Difficulty of Paying Living Expenses: Not hard at all  Food Insecurity: No Food Insecurity (04/09/2022)   Hunger Vital Sign    Worried About Running Out of Food in the Last Year: Never true    Ran Out of Food in the Last Year: Never true  Transportation Needs: No Transportation Needs (04/09/2022)   PRAPARE - Hydrologist (Medical): No    Lack of Transportation (Non-Medical): No  Physical Activity: Sufficiently Active (04/09/2022)   Exercise Vital Sign    Days of Exercise per Week: 3 days    Minutes of Exercise per Session: 60 min  Stress: Stress Concern Present (04/09/2022)   [place]    Feeling of Stress : To some extent  Social Connections: Moderately Integrated (04/09/2022)   Social Connection and Isolation Panel [NHANES]    Frequency of Communication with Friends and Family: More than three times a week    Frequency of Social Gatherings with Friends and Family: Three times a week    Attends Religious Services: More than 4  times per year    Active Member of Clubs or Organizations: Yes    Attends Archivist Meetings:  More than 4 times per year    Marital Status: Widowed  Intimate Partner Violence: Not At Risk (07/04/2021)   Humiliation, Afraid, Rape, and Kick questionnaire    Fear of Current or Ex-Partner: No    Emotionally Abused: No    Physically Abused: No    Sexually Abused: No    Review of Systems  All other systems reviewed and are negative.   PHYSICAL EXAMINATION:    BP (!) 144/72   Pulse 72   Ht '5\' 6"'$  (1.676 m)   LMP 08/29/2002 (Approximate)   SpO2 100%   BMI 26.97 kg/m     General appearance: alert, cooperative and appears stated age  Pelvic: External genitalia:  no lesions              Urethra:  normal appearing urethra with no masses, tenderness or lesions              Bartholins and Skenes: normal                 Vagina: atrophic appearing vagina with a small amount of thin, yellow vaginal discharge. Tight introitus, only able to fit one finger vaginally.               Cervix: no lesions              Bimanual Exam:  Uterus:   no mases or tenderness              Adnexa: no mass, fullness, tenderness                Chaperone was present for exam.  1. Postmenopausal bleeding She has had intermittent light brown vaginal discharge. Not seen on exam today. We discussed that sometimes you can get a yellow/brown d/c from atrophy but it wasn't seen on exam today. Need to r/o endometrial source.  - US PELVIS TRANSVAGINAL NON-OB (TV ONLY); Future  2. Vaginal atrophy If her ultrasound is normal, will call in vaginal estrogen for her. No contraindication, reviewed very small possible risks  3. Other fatigue I recommended she f/u with her primary  4. Vaginal discharge - WET PREP FOR [place], YEAST, CLUE: negative

## 2022-05-03 ENCOUNTER — Other Ambulatory Visit: Payer: Self-pay | Admitting: Obstetrics and Gynecology

## 2022-05-03 ENCOUNTER — Ambulatory Visit (INDEPENDENT_AMBULATORY_CARE_PROVIDER_SITE_OTHER): Payer: Medicare Other

## 2022-05-03 DIAGNOSIS — N950 Postmenopausal bleeding: Secondary | ICD-10-CM

## 2022-05-05 ENCOUNTER — Other Ambulatory Visit: Payer: Self-pay | Admitting: Family Medicine

## 2022-05-09 ENCOUNTER — Other Ambulatory Visit: Payer: Medicare Other

## 2022-05-10 ENCOUNTER — Other Ambulatory Visit: Payer: Self-pay | Admitting: Obstetrics and Gynecology

## 2022-05-10 NOTE — Telephone Encounter (Signed)
Surgery: CPT [phone] - Hysteroscopy/D&C/Myosure  Diagnosis: N95.0 Postmenopausal Bleeding  Location: [place]  Status: Outpatient  Time: 30 Minutes  Assistant: N/A  Urgency: First Available  Pre-Op Appointment: To Be Scheduled  Post-Op Appointment(s): 1 Week  Time Out Of Work: Day Of Surgery ONLY  Please pretreat with cytotec: 200 mgc, place 2 tablets vaginally the night prior to surgery.

## 2022-05-11 ENCOUNTER — Encounter: Payer: Self-pay | Admitting: Obstetrics and Gynecology

## 2022-05-11 NOTE — Telephone Encounter (Signed)
Left message to call Muzammil Bruins, RN at [facility], [phone].  

## 2022-05-14 NOTE — Telephone Encounter (Signed)
See open refill encounter dated 05/10/22.   Encounter closed

## 2022-05-14 NOTE — Telephone Encounter (Signed)
Spoke with patient. Reviewed surgery dates. Patient request to proceed with surgery on 06/05/22, declined earlier date, will be out of town.  Advised patient I will forward to business office for return call. I will return call once surgery date and time confirmed. Patient verbalizes understanding and is agreeable.   Surgery request sent.

## 2022-05-15 MED ORDER — MISOPROSTOL 200 MCG PO TABS: ORAL_TABLET | ORAL | 0 refills | Status: DC

## 2022-05-15 NOTE — Telephone Encounter (Signed)
Spoke with patient. Surgery date request confirmed.  Advised surgery is scheduled for 06/05/22, [facility] at 0730.  Cytotec instructions reviewed and Rx sent to verified pharmacy.  Surgery instruction sheet and hospital brochure reviewed, printed copy will picked up by patient.   Routing to [employer] for benefits.

## 2022-05-18 DIAGNOSIS — H5212 Myopia, left eye: Secondary | ICD-10-CM | POA: Diagnosis not present

## 2022-05-18 DIAGNOSIS — H2513 Age-related nuclear cataract, bilateral: Secondary | ICD-10-CM | POA: Diagnosis not present

## 2022-05-18 DIAGNOSIS — H04123 Dry eye syndrome of bilateral lacrimal glands: Secondary | ICD-10-CM | POA: Diagnosis not present

## 2022-05-18 DIAGNOSIS — H5201 Hypermetropia, right eye: Secondary | ICD-10-CM | POA: Diagnosis not present

## 2022-05-25 NOTE — Telephone Encounter (Signed)
Spoke with patient regarding surgery benefits. Patient acknowledges understanding of information presented. Patient is aware that benefits presented are professional benefits only. Patient is aware the hospital will call with facility benefits. See account note.Message sent to front desk regarding surgery payment. Patient aware of cost.

## 2022-05-28 ENCOUNTER — Other Ambulatory Visit: Payer: Self-pay

## 2022-05-28 ENCOUNTER — Encounter (HOSPITAL_BASED_OUTPATIENT_CLINIC_OR_DEPARTMENT_OTHER): Payer: Self-pay | Admitting: Obstetrics and Gynecology

## 2022-05-28 NOTE — Progress Notes (Signed)
Spoke w/ via phone for pre-op interview---pt Lab needs dos----  I stat per anesthesia, surgery orders pending             Lab results------ COVID test -----patient states asymptomatic no test needed Arrive at -------530 am 06-05-2022 NPO after MN NO Solid Food.  Clear liquids from MN until---430 am Med rec completed Medications to take morning of surgery -----amlodipine, wellbutrin, duloxetine, rosuvastatin if taken that day Diabetic medication -----n/a Patient instructed no nail polish to be worn day of surgery Patient instructed to bring photo id and insurance card day of surgery Patient aware to have Driver (ride ) / caregiver   Lori Harvey friend for 24 hours after surgery  Patient Special Instructions -----pt to follow up with dr Talbert Nan on 81 mg aspirin at pre op appointment on 05-31-2022 Pre-Op special Istructions -----bring cpap mask tubing and machine and leave in car, surgery orders requested dr Talbert Nan epic ib Patient verbalized understanding of instructions that were given at this phone interview. Patient denies shortness of breath, chest pain, fever, cough at this phone interview.   Lov dr Angelena Form cardiology  01-10-2022 epic Ekg 01-10-2022 chart/epic Ct scoring 01-26-2022 epic Echo 01-26-2022 epic Cpap titration report 04-18-2020 epic

## 2022-05-30 ENCOUNTER — Ambulatory Visit: Payer: Medicare Other | Admitting: Obstetrics and Gynecology

## 2022-05-31 ENCOUNTER — Encounter: Payer: Medicare Other | Admitting: Obstetrics and Gynecology

## 2022-06-04 ENCOUNTER — Ambulatory Visit (INDEPENDENT_AMBULATORY_CARE_PROVIDER_SITE_OTHER): Payer: Medicare Other | Admitting: Obstetrics and Gynecology

## 2022-06-04 ENCOUNTER — Encounter: Payer: Self-pay | Admitting: Obstetrics and Gynecology

## 2022-06-04 VITALS — BP 122/64 | HR 88 | Wt 166.8 lb

## 2022-06-04 DIAGNOSIS — N950 Postmenopausal bleeding: Secondary | ICD-10-CM | POA: Diagnosis not present

## 2022-06-04 NOTE — H&P (View-Only) (Signed)
GYNECOLOGY  VISIT   HPI: 71 y.o.   Widowed White or Caucasian Not Hispanic or Latino  female   G1P0010 with Patient's last menstrual period was 08/29/2002 (approximate).   here for pre-op appointment. She has had PMP spotting, u/s last month showed an endometrial stripe of 5.9 mm, asymmetric, 7 mm intracavitary mass suspected.   GYNECOLOGIC HISTORY: Patient's last menstrual period was 08/29/2002 (approximate). 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   Prediabetes    Major depressive disorder, recurrent episode, mild ([place]) 08/24/2018   Major depressive disorder, recurrent episode, in full remission ([place]) 07/14/2018   Pain in joint of right elbow 12/04/2017   Osteopenia 02/11/2014   Benign paroxysmal positional vertigo 02/11/2014   Melanoma ([place]) 03/31/2012   History of depression 03/31/2012   Essential hypertension 03/31/2012    Past Medical History:  Diagnosis Date   Arthritis    Cancer ([place])    right leg, upper thigh AND CALF melanoma   Depression    Family history of adverse reaction to anesthesia    older sisters x 2 with ponv   GERD (gastroesophageal reflux disease)    Hypertension    Patient denies   Melanoma ([place])    Obstructive sleep apnea    uses cpap some nights set on 17 per cpap titration report 04-18-2020 epic   Osteopenia    PMB (postmenopausal bleeding)    Pre-diabetes    UTI (lower urinary tract infection)    Wears glasses     Past Surgical History:  Procedure Laterality Date   BREAST BIOPSY Left    several yrs ago   COLONOSCOPY  2020   MELANOMA EXCISION  2006   right thigh and calf   pre melamona area  removed  10/2021   REFRACTIVE SURGERY     squamous cell area removed  11/2021   upper left leg   TONSILLECTOMY     as child   WISDOM TOOTH EXTRACTION     many yrs ago    Current Outpatient Medications  Medication Sig Dispense Refill   amLODipine (NORVASC) 2.5 MG tablet Take 1 tablet (2.5 mg total) by mouth daily. 90 tablet 3   aspirin EC 81 MG tablet Take 1 tablet (81 mg total) by mouth daily. Swallow whole. 90 tablet 3   B Complex-C (SUPER B COMPLEX PO) Take 1 capsule by mouth daily.     buPROPion (WELLBUTRIN XL) 150 MG 24 hr tablet Take 1 tablet (150 mg total) by mouth daily. 90 tablet 3   cholecalciferol (VITAMIN D3) 25 MCG (1000 UNIT) tablet Take 1,000 Units by mouth daily.     DULoxetine (CYMBALTA) 30 MG capsule TAKE 1 CAPSULES BY MOUTH DAILY WITH '60MG'$  CAPSULE TO EQUAL TOTAL DAILY DOSE OF '90MG'$  90 capsule 3   DULoxetine (CYMBALTA) 60 MG capsule TAKE 1 CAPSULE BY MOUTH DAILY WITH '30MG'$  CAPSULE TO EQUAL TOTAL DAILY  DOSE OF '90MG'$  90 capsule 3   losartan-hydrochlorothiazide (HYZAAR) 100-12.5 MG tablet TAKE 1 TABLET BY MOUTH EVERY DAY 90 tablet 0   misoprostol (CYTOTEC) 200 MCG tablet Cytotec 200 mcg. Place 2 tablets vaginally the night prior to surgery. 2 tablet 0   Multiple Vitamin (MULTIVITAMIN) capsule Take 1 capsule by mouth daily.     Omega-3 Fatty Acids (FISH OIL PO) Take by mouth daily.     rosuvastatin (CRESTOR) 5 MG tablet Take 1 tablet (5 mg total) by mouth every other day. 45 tablet 3   traZODone (DESYREL) 50 MG tablet Take 0.5-1 tablets (25-50 mg total) by mouth at bedtime as needed. for sleep 90 tablet 1   No current facility-administered medications for this visit.     ALLERGIES: Keflex [cephalexin] and Sulfa antibiotics  Family History  Problem Relation Age of Onset   Cancer Mother        breast   Stroke Mother    Breast cancer Mother    Depression Mother    Alcohol abuse Father    Heart attack Father    Other Sister 26       colon mass, benign   Hypertension  Sister    Colon cancer Sister 52   Depression Sister    Colon polyps Sister    Prostate cancer Brother    Other Sister 57       colon mass- not colon cancer   Hypertension Sister    Colon polyps Sister    Anxiety disorder Maternal Aunt    Esophageal cancer Neg Hx    Rectal cancer Neg Hx    Stomach cancer Neg Hx     Social History   Socioeconomic History   Marital status: Widowed    Spouse name: Not on file   Number of children: 0   Years of education: Not on file   Highest education level: Bachelor's degree (e.g., BA, AB, BS)  Occupational History   Occupation: Retired Education officer, 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   Smoking status: Never   Smokeless tobacco: Never  Vaping Use   Vaping Use: Never used  Substance and Sexual Activity   Alcohol use: Yes    Alcohol/week: 1.0 - 2.0 standard drink of alcohol    Types: 1 - 2 Glasses of wine per week    Comment: 1 to  3 glasses wine per week   Drug use: No   Sexual activity: Not Currently    Birth control/protection: Post-menopausal  Other Topics Concern   Not on file  Social History Narrative   Not on file   Social Determinants of Health   Financial Resource Strain: Low Risk  (04/09/2022)   Overall Financial Resource Strain (CARDIA)    Difficulty of Paying Living Expenses: Not hard at all  Food Insecurity: No Food Insecurity (04/09/2022)   Hunger Vital Sign    Worried About Running Out of Food in the Last Year: Never true    Ran Out of Food in the Last Year: Never true  Transportation Needs: No Transportation Needs (04/09/2022)   PRAPARE - Hydrologist (Medical): No    Lack of Transportation (Non-Medical): No  Physical Activity: Sufficiently Active (04/09/2022)   Exercise Vital Sign    Days of Exercise per Week: 3 days    Minutes of Exercise per Session: 60 min  Stress: Stress Concern Present (04/09/2022)   [place]  Feeling of Stress : To some extent  Social Connections: Moderately Integrated (04/09/2022)   Social Connection and Isolation Panel [NHANES]    Frequency of Communication with Friends and Family: More than three times a week    Frequency of Social Gatherings with Friends and Family: Three times a week    Attends Religious Services: More than 4 times per year    Active Member of Clubs or Organizations: Yes    Attends Archivist Meetings: More than 4 times per year    Marital Status: Widowed  Intimate Partner Violence: Not At Risk (07/04/2021)   Humiliation, Afraid, Rape, and Kick questionnaire    Fear of Current or Ex-Partner: No    Emotionally Abused: No    Physically Abused: No    Sexually Abused: No    Review of Systems  All other systems reviewed and are negative.   PHYSICAL EXAMINATION:    BP 122/64   Pulse 88   Wt 166 lb 12.8 oz (75.7 kg)   LMP 08/29/2002 (Approximate)   SpO2 99%   BMI 26.92 kg/m     General appearance: alert, cooperative and appears stated age Neck: no adenopathy, supple, symmetrical, trachea midline and thyroid normal to inspection and palpation Heart: regular rate and rhythm Lungs: CTAB Abdomen: soft, non-tender; bowel sounds normal; no masses,  no organomegaly Extremities: normal, atraumatic, no cyanosis Skin: normal color, texture and turgor, no rashes or lesions Lymph: normal cervical supraclavicular and inguinal nodes Neurologic: grossly normal   1. Postmenopausal bleeding Plan: hysteroscopy, dilation and curettage. Reviewed risks, including: bleeding, infection, uterine perforation, fluid overload, need for further surgery. Will pre treat with cytotec.

## 2022-06-04 NOTE — Anesthesia Preprocedure Evaluation (Signed)
Anesthesia Evaluation  Patient identified by MRN, date of birth, ID band Patient awake    Reviewed: Allergy & Precautions, NPO status , Patient's Chart, lab work & pertinent test results  History of Anesthesia Complications (+) PONV and history of anesthetic complications  Airway Mallampati: II  TM Distance: >3 FB Neck ROM: Full    Dental no notable dental hx. (+) Caps, Dental Advisory Given, Teeth Intact   Pulmonary sleep apnea ,    Pulmonary exam normal breath sounds clear to auscultation       Cardiovascular hypertension, Normal cardiovascular exam Rhythm:Regular Rate:Normal     Neuro/Psych PSYCHIATRIC DISORDERS Depression    GI/Hepatic Neg liver ROS, GERD  ,  Endo/Other  negative endocrine ROS  Renal/GU negative Renal ROS     Musculoskeletal  (+) Arthritis ,   Abdominal   Peds  Hematology   Anesthesia Other Findings All: Keflex, sulfa  Reproductive/Obstetrics                            Anesthesia Physical Anesthesia Plan  ASA: 2  Anesthesia Plan: General   Post-op Pain Management: Toradol IV (intra-op)* and Tylenol PO (pre-op)*   Induction: Intravenous  PONV Risk Score and Plan: 4 or greater and Treatment may vary due to age or medical condition, Ondansetron, Midazolam and TIVA  Airway Management Planned: LMA  Additional Equipment: None  Intra-op Plan:   Post-operative Plan:   Informed Consent:     Dental advisory given  Plan Discussed with:   Anesthesia Plan Comments:        Anesthesia Quick Evaluation

## 2022-06-04 NOTE — Progress Notes (Signed)
GYNECOLOGY  VISIT   HPI: 71 y.o.   Widowed White or Caucasian Not Hispanic or Latino  female   G1P0010 with Patient's last menstrual period was 08/29/2002 (approximate).   here for pre-op appointment. She has had PMP spotting, u/s last month showed an endometrial stripe of 5.9 mm, asymmetric, 7 mm intracavitary mass suspected.   GYNECOLOGIC HISTORY: Patient's last menstrual period was 08/29/2002 (approximate). 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   Prediabetes    Major depressive disorder, recurrent episode, mild ([place]) 08/24/2018   Major depressive disorder, recurrent episode, in full remission ([place]) 07/14/2018   Pain in joint of right elbow 12/04/2017   Osteopenia 02/11/2014   Benign paroxysmal positional vertigo 02/11/2014   Melanoma ([place]) 03/31/2012   History of depression 03/31/2012   Essential hypertension 03/31/2012    Past Medical History:  Diagnosis Date   Arthritis    Cancer ([place])    right leg, upper thigh AND CALF melanoma   Depression    Family history of adverse reaction to anesthesia    older sisters x 2 with ponv   GERD (gastroesophageal reflux disease)    Hypertension    Patient denies   Melanoma ([place])    Obstructive sleep apnea    uses cpap some nights set on 17 per cpap titration report 04-18-2020 epic   Osteopenia    PMB (postmenopausal bleeding)    Pre-diabetes    UTI (lower urinary tract infection)    Wears glasses     Past Surgical History:  Procedure Laterality Date   BREAST BIOPSY Left    several yrs ago   COLONOSCOPY  2020   MELANOMA EXCISION  2006   right thigh and calf   pre melamona area  removed  10/2021   REFRACTIVE SURGERY     squamous cell area removed  11/2021   upper left leg   TONSILLECTOMY     as child   WISDOM TOOTH EXTRACTION     many yrs ago    Current Outpatient Medications  Medication Sig Dispense Refill   amLODipine (NORVASC) 2.5 MG tablet Take 1 tablet (2.5 mg total) by mouth daily. 90 tablet 3   aspirin EC 81 MG tablet Take 1 tablet (81 mg total) by mouth daily. Swallow whole. 90 tablet 3   B Complex-C (SUPER B COMPLEX PO) Take 1 capsule by mouth daily.     buPROPion (WELLBUTRIN XL) 150 MG 24 hr tablet Take 1 tablet (150 mg total) by mouth daily. 90 tablet 3   cholecalciferol (VITAMIN D3) 25 MCG (1000 UNIT) tablet Take 1,000 Units by mouth daily.     DULoxetine (CYMBALTA) 30 MG capsule TAKE 1 CAPSULES BY MOUTH DAILY WITH '60MG'$  CAPSULE TO EQUAL TOTAL DAILY DOSE OF '90MG'$  90 capsule 3   DULoxetine (CYMBALTA) 60 MG capsule TAKE 1 CAPSULE BY MOUTH DAILY WITH '30MG'$  CAPSULE TO EQUAL TOTAL DAILY  DOSE OF '90MG'$  90 capsule 3   losartan-hydrochlorothiazide (HYZAAR) 100-12.5 MG tablet TAKE 1 TABLET BY MOUTH EVERY DAY 90 tablet 0   misoprostol (CYTOTEC) 200 MCG tablet Cytotec 200 mcg. Place 2 tablets vaginally the night prior to surgery. 2 tablet 0   Multiple Vitamin (MULTIVITAMIN) capsule Take 1 capsule by mouth daily.     Omega-3 Fatty Acids (FISH OIL PO) Take by mouth daily.     rosuvastatin (CRESTOR) 5 MG tablet Take 1 tablet (5 mg total) by mouth every other day. 45 tablet 3   traZODone (DESYREL) 50 MG tablet Take 0.5-1 tablets (25-50 mg total) by mouth at bedtime as needed. for sleep 90 tablet 1   No current facility-administered medications for this visit.     ALLERGIES: Keflex [cephalexin] and Sulfa antibiotics  Family History  Problem Relation Age of Onset   Cancer Mother        breast   Stroke Mother    Breast cancer Mother    Depression Mother    Alcohol abuse Father    Heart attack Father    Other Sister 66       colon mass, benign   Hypertension  Sister    Colon cancer Sister 5   Depression Sister    Colon polyps Sister    Prostate cancer Brother    Other Sister 59       colon mass- not colon cancer   Hypertension Sister    Colon polyps Sister    Anxiety disorder Maternal Aunt    Esophageal cancer Neg Hx    Rectal cancer Neg Hx    Stomach cancer Neg Hx     Social History   Socioeconomic History   Marital status: Widowed    Spouse name: Not on file   Number of children: 0   Years of education: Not on file   Highest education level: Bachelor's degree (e.g., BA, AB, BS)  Occupational History   Occupation: Retired Education officer, 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   Smoking status: Never   Smokeless tobacco: Never  Vaping Use   Vaping Use: Never used  Substance and Sexual Activity   Alcohol use: Yes    Alcohol/week: 1.0 - 2.0 standard drink of alcohol    Types: 1 - 2 Glasses of wine per week    Comment: 1 to  3 glasses wine per week   Drug use: No   Sexual activity: Not Currently    Birth control/protection: Post-menopausal  Other Topics Concern   Not on file  Social History Narrative   Not on file   Social Determinants of Health   Financial Resource Strain: Low Risk  (04/09/2022)   Overall Financial Resource Strain (CARDIA)    Difficulty of Paying Living Expenses: Not hard at all  Food Insecurity: No Food Insecurity (04/09/2022)   Hunger Vital Sign    Worried About Running Out of Food in the Last Year: Never true    Ran Out of Food in the Last Year: Never true  Transportation Needs: No Transportation Needs (04/09/2022)   PRAPARE - Hydrologist (Medical): No    Lack of Transportation (Non-Medical): No  Physical Activity: Sufficiently Active (04/09/2022)   Exercise Vital Sign    Days of Exercise per Week: 3 days    Minutes of Exercise per Session: 60 min  Stress: Stress Concern Present (04/09/2022)   [place]  Feeling of Stress : To some extent  Social Connections: Moderately Integrated (04/09/2022)   Social Connection and Isolation Panel [NHANES]    Frequency of Communication with Friends and Family: More than three times a week    Frequency of Social Gatherings with Friends and Family: Three times a week    Attends Religious Services: More than 4 times per year    Active Member of Clubs or Organizations: Yes    Attends Archivist Meetings: More than 4 times per year    Marital Status: Widowed  Intimate Partner Violence: Not At Risk (07/04/2021)   Humiliation, Afraid, Rape, and Kick questionnaire    Fear of Current or Ex-Partner: No    Emotionally Abused: No    Physically Abused: No    Sexually Abused: No    Review of Systems  All other systems reviewed and are negative.   PHYSICAL EXAMINATION:    BP 122/64   Pulse 88   Wt 166 lb 12.8 oz (75.7 kg)   LMP 08/29/2002 (Approximate)   SpO2 99%   BMI 26.92 kg/m     General appearance: alert, cooperative and appears stated age Neck: no adenopathy, supple, symmetrical, trachea midline and thyroid normal to inspection and palpation Heart: regular rate and rhythm Lungs: CTAB Abdomen: soft, non-tender; bowel sounds normal; no masses,  no organomegaly Extremities: normal, atraumatic, no cyanosis Skin: normal color, texture and turgor, no rashes or lesions Lymph: normal cervical supraclavicular and inguinal nodes Neurologic: grossly normal   1. Postmenopausal bleeding Plan: hysteroscopy, dilation and curettage. Reviewed risks, including: bleeding, infection, uterine perforation, fluid overload, need for further surgery. Will pre treat with cytotec.

## 2022-06-05 ENCOUNTER — Encounter (HOSPITAL_BASED_OUTPATIENT_CLINIC_OR_DEPARTMENT_OTHER)
Admission: RE | Disposition: A | Discharge status: Home / Self Care | Payer: Self-pay | Source: Ambulatory Visit | Attending: Obstetrics and Gynecology

## 2022-06-05 ENCOUNTER — Ambulatory Visit (HOSPITAL_BASED_OUTPATIENT_CLINIC_OR_DEPARTMENT_OTHER): Admit: 2022-06-05 | Payer: Medicare Other | Admitting: Obstetrics and Gynecology

## 2022-06-05 ENCOUNTER — Ambulatory Visit (HOSPITAL_BASED_OUTPATIENT_CLINIC_OR_DEPARTMENT_OTHER)
Admission: RE | Admit: 2022-06-05 | Discharge: 2022-06-05 | Disposition: A | Discharge status: Home / Self Care | Payer: Medicare Other | Source: Ambulatory Visit | Attending: Obstetrics and Gynecology | Admitting: Obstetrics and Gynecology

## 2022-06-05 ENCOUNTER — Encounter (HOSPITAL_BASED_OUTPATIENT_CLINIC_OR_DEPARTMENT_OTHER): Payer: Self-pay

## 2022-06-05 ENCOUNTER — Encounter (HOSPITAL_BASED_OUTPATIENT_CLINIC_OR_DEPARTMENT_OTHER): Payer: Self-pay | Admitting: Obstetrics and Gynecology

## 2022-06-05 ENCOUNTER — Ambulatory Visit (HOSPITAL_BASED_OUTPATIENT_CLINIC_OR_DEPARTMENT_OTHER): Payer: Medicare Other | Admitting: Anesthesiology

## 2022-06-05 ENCOUNTER — Other Ambulatory Visit: Payer: Self-pay

## 2022-06-05 DIAGNOSIS — N8500 Endometrial hyperplasia, unspecified: Secondary | ICD-10-CM | POA: Diagnosis not present

## 2022-06-05 DIAGNOSIS — Z01818 Encounter for other preprocedural examination: Secondary | ICD-10-CM

## 2022-06-05 DIAGNOSIS — N840 Polyp of corpus uteri: Secondary | ICD-10-CM | POA: Diagnosis not present

## 2022-06-05 DIAGNOSIS — R9389 Abnormal findings on diagnostic imaging of other specified body structures: Secondary | ICD-10-CM

## 2022-06-05 DIAGNOSIS — N950 Postmenopausal bleeding: Secondary | ICD-10-CM | POA: Insufficient documentation

## 2022-06-05 HISTORY — DX: Prediabetes: R73.03

## 2022-06-05 HISTORY — DX: Unspecified osteoarthritis, unspecified site: M19.90

## 2022-06-05 HISTORY — DX: Family history of other specified conditions: Z84.89

## 2022-06-05 HISTORY — PX: DILATATION & CURETTAGE/HYSTEROSCOPY WITH MYOSURE: SHX6511

## 2022-06-05 HISTORY — DX: Presence of spectacles and contact lenses: Z97.3

## 2022-06-05 HISTORY — DX: Postmenopausal bleeding: N95.0

## 2022-06-05 SURGERY — DILATATION & CURETTAGE/HYSTEROSCOPY WITH MYOSURE
Anesthesia: General | Site: Uterus

## 2022-06-05 SURGERY — DILATATION & CURETTAGE/HYSTEROSCOPY WITH MYOSURE
Anesthesia: Choice

## 2022-06-05 MED ORDER — LIDOCAINE 2% (20 MG/ML) 5 ML SYRINGE
INTRAMUSCULAR | Status: DC | PRN
  Administered 2022-06-05: 80 mg via INTRAVENOUS

## 2022-06-05 MED ORDER — MIDAZOLAM HCL 2 MG/2ML IJ SOLN
INTRAMUSCULAR | Status: AC
  Filled 2022-06-05: qty 2

## 2022-06-05 MED ORDER — ACETAMINOPHEN 500 MG PO TABS
ORAL_TABLET | ORAL | Status: AC
  Filled 2022-06-05: qty 2

## 2022-06-05 MED ORDER — FENTANYL CITRATE (PF) 100 MCG/2ML IJ SOLN
INTRAMUSCULAR | Status: AC
  Filled 2022-06-05: qty 2

## 2022-06-05 MED ORDER — PROPOFOL 500 MG/50ML IV EMUL
INTRAVENOUS | Status: DC | PRN
  Administered 2022-06-05: 200 ug/kg/min via INTRAVENOUS

## 2022-06-05 MED ORDER — KETOROLAC TROMETHAMINE 30 MG/ML IJ SOLN: 15.0000 mg | Freq: Once | INTRAMUSCULAR | Status: DC | PRN

## 2022-06-05 MED ORDER — ONDANSETRON HCL 4 MG/2ML IJ SOLN
INTRAMUSCULAR | Status: AC
  Filled 2022-06-05: qty 2

## 2022-06-05 MED ORDER — ONDANSETRON HCL 4 MG/2ML IJ SOLN
INTRAMUSCULAR | Status: DC | PRN
  Administered 2022-06-05: 4 mg via INTRAVENOUS

## 2022-06-05 MED ORDER — HYDROMORPHONE HCL 1 MG/ML IJ SOLN: 0.2500 mg | INTRAMUSCULAR | Status: DC | PRN

## 2022-06-05 MED ORDER — ONDANSETRON HCL 4 MG/2ML IJ SOLN: 4.0000 mg | Freq: Once | INTRAMUSCULAR | Status: DC | PRN

## 2022-06-05 MED ORDER — MIDAZOLAM HCL 5 MG/5ML IJ SOLN
INTRAMUSCULAR | Status: DC | PRN
  Administered 2022-06-05: 1 mg via INTRAVENOUS

## 2022-06-05 MED ORDER — PROPOFOL 10 MG/ML IV BOLUS
INTRAVENOUS | Status: AC
  Filled 2022-06-05: qty 20

## 2022-06-05 MED ORDER — PROPOFOL 1000 MG/100ML IV EMUL
INTRAVENOUS | Status: AC
  Filled 2022-06-05: qty 100

## 2022-06-05 MED ORDER — OXYCODONE HCL 5 MG PO TABS: 5.0000 mg | ORAL_TABLET | Freq: Once | ORAL | Status: DC | PRN

## 2022-06-05 MED ORDER — LIDOCAINE HCL (PF) 2 % IJ SOLN
INTRAMUSCULAR | Status: AC
  Filled 2022-06-05: qty 5

## 2022-06-05 MED ORDER — ACETAMINOPHEN 500 MG PO TABS
1000.0000 mg | ORAL_TABLET | ORAL | Status: AC
  Administered 2022-06-05: 1000 mg via ORAL

## 2022-06-05 MED ORDER — LACTATED RINGERS IV SOLN
INTRAVENOUS | Status: DC
Start: 2022-06-05 — End: 2022-06-05

## 2022-06-05 MED ORDER — DEXAMETHASONE SODIUM PHOSPHATE 10 MG/ML IJ SOLN
INTRAMUSCULAR | Status: DC | PRN
  Administered 2022-06-05: 5 mg via INTRAVENOUS

## 2022-06-05 MED ORDER — DEXAMETHASONE SODIUM PHOSPHATE 10 MG/ML IJ SOLN
INTRAMUSCULAR | Status: AC
  Filled 2022-06-05: qty 1

## 2022-06-05 MED ORDER — FENTANYL CITRATE (PF) 100 MCG/2ML IJ SOLN
INTRAMUSCULAR | Status: DC | PRN
Start: 2022-06-05 — End: 2022-06-05
  Administered 2022-06-05 (×2): 50 ug via INTRAVENOUS

## 2022-06-05 MED ORDER — OXYCODONE HCL 5 MG/5ML PO SOLN: 5.0000 mg | Freq: Once | ORAL | Status: DC | PRN

## 2022-06-05 MED ORDER — PROPOFOL 10 MG/ML IV BOLUS
INTRAVENOUS | Status: DC | PRN
  Administered 2022-06-05: 170 mg via INTRAVENOUS
  Administered 2022-06-05: 30 mg via INTRAVENOUS

## 2022-06-05 MED ORDER — SODIUM CHLORIDE 0.9 % IR SOLN
Status: DC | PRN
  Administered 2022-06-05: 3000 mL

## 2022-06-05 SURGICAL SUPPLY — 22 items
CATH ROBINSON RED A/P 16FR (CATHETERS) IMPLANT
DEVICE MYOSURE LITE (MISCELLANEOUS) IMPLANT
DEVICE MYOSURE REACH (MISCELLANEOUS) ×1 IMPLANT
DILATOR CANAL MILEX (MISCELLANEOUS) IMPLANT
DRSG TELFA 3X8 NADH (GAUZE/BANDAGES/DRESSINGS) ×2 IMPLANT
GAUZE 4X4 16PLY ~~LOC~~+RFID DBL (SPONGE) ×4 IMPLANT
GLOVE BIO SURGEON STRL SZ 6.5 (GLOVE) ×3 IMPLANT
GOWN STRL REUS W/TWL LRG LVL3 (GOWN DISPOSABLE) ×3 IMPLANT
IV NS IRRIG 3000ML ARTHROMATIC (IV SOLUTION) ×3 IMPLANT
KIT PROCEDURE FLUENT (KITS) ×3 IMPLANT
KIT TURNOVER CYSTO (KITS) ×3 IMPLANT
MYOSURE XL FIBROID (MISCELLANEOUS)
PACK VAGINAL MINOR WOMEN LF (CUSTOM PROCEDURE TRAY) ×3 IMPLANT
PAD DRESSING TELFA 3X8 NADH (GAUZE/BANDAGES/DRESSINGS) ×2 IMPLANT
PAD OB MATERNITY 4.3X12.25 (PERSONAL CARE ITEMS) ×3 IMPLANT
PAD PREP 24X48 CUFFED NSTRL (MISCELLANEOUS) ×3 IMPLANT
SEAL CERVICAL OMNI LOK (ABLATOR) IMPLANT
SEAL ROD LENS SCOPE MYOSURE (ABLATOR) ×3 IMPLANT
SYR 20ML LL LF (SYRINGE) IMPLANT
SYSTEM TISS REMOVAL MYOSURE XL (MISCELLANEOUS) IMPLANT
TOWEL OR 17X26 10 PK STRL BLUE (TOWEL DISPOSABLE) ×3 IMPLANT
WATER STERILE IRR 500ML POUR (IV SOLUTION) IMPLANT

## 2022-06-05 NOTE — Interval H&P Note (Signed)
History and Physical Interval Note:  06/05/2022 7:14 AM  Lori Harvey  has presented today for surgery, with the diagnosis of Postmenopausal bleeding.  The various methods of treatment have been discussed with the patient and family. After consideration of risks, benefits and other options for treatment, the patient has consented to  Procedure(s): [place] (N/A) as a surgical intervention.  The patient's history has been reviewed, patient examined, no change in status, stable for surgery.  I have reviewed the patient's chart and labs.  Questions were answered to the patient's satisfaction.     Salvadore Dom

## 2022-06-05 NOTE — Op Note (Signed)
Preoperative Diagnosis: postmenopausal bleeding, thickened endometrial stripe  Postoperative Diagnosis: same  Procedure: Hysteroscopy, dilation and curettage  Surgeon: Dr Sumner Boast  Assistants: None  Anesthesia: General via LMA  EBL: 3 cc  Fluids: 700 cc  Fluid deficit: 75 cc  Urine output: not recorded  Indications for surgery: The patient is a 71 yo female, who presented with postmenopausal bleeding. Ultrasound revealed an endometrial stripe of 5.9 mm, 7 mm intracavitary mass suspected.  The risks of the surgery were reviewed with the patient and the consent form was signed prior to her surgery.  Findings: EUA: small, anteverted uterus, no adnexal masses. Hysteroscopy: scarring of the endometrium near the ostia bilaterally. Thickening of the anterior endometrium consistent with a sessile polyp  Specimens: endometrial polyp, endometrial curettings   Procedure: The patient was taken to the operating room with an IV in place. She was placed in the dorsal lithotomy position and anesthesia was administered. She was prepped and draped in the usual sterile fashion for a vaginal procedure. She voided on the way to the OR. An adolescent speculum was placed in the vagina and a single tooth tenaculum was placed on the anterior lip of the cervix. The cervix was dilated to a #18 pratt dilator. The uterus was sounded to ~5 cm. The myosure hysteroscope was inserted into the uterine cavity. With continuous infusion of normal saline, the uterine cavity was visualized with the above findings. The myosure reach was used to resect the endometrial thickening. The myosure was then removed. The cavity was then curetted with the small sharp curette. The cavity had the characteristically gritty texture at the end of the procedure. The curette and the single tooth tenaculum were removed. The speculum was removed. The patients perineum was cleansed of betadine and she was taken out of the dorsal lithotomy  position.  Upon awakening the LMA was removed and the patient was transferred to the recovery room in stable and awake condition.  The sponge and instrument count were correct. There were no complications.

## 2022-06-05 NOTE — Anesthesia Procedure Notes (Signed)
Procedure Name: LMA Insertion Date/Time: 06/05/2022 7:42 AM  Performed by: Rogers Blocker, CRNAPre-anesthesia Checklist: Patient identified, Emergency Drugs available, Suction available and Patient being monitored Patient Re-evaluated:Patient Re-evaluated prior to induction Oxygen Delivery Method: Circle System Utilized Preoxygenation: Pre-oxygenation with 100% oxygen Induction Type: IV induction Ventilation: Mask ventilation without difficulty LMA: LMA inserted LMA Size: 4.0 Number of attempts: 1 Placement Confirmation: positive ETCO2 Tube secured with: Tape Dental Injury: Teeth and Oropharynx as per pre-operative assessment

## 2022-06-05 NOTE — Discharge Instructions (Signed)
NO TYLENOL PRODUCTS UNTIL 12:15 PM TODAY.        Post Anesthesia Home Care Instructions  Activity: Get plenty of rest for the remainder of the day. A responsible individual must stay with you for 24 hours following the procedure.  For the next 24 hours, DO NOT: -Drive a car -Paediatric nurse -Drink alcoholic beverages -Take any medication unless instructed by your physician -Make any legal decisions or sign important papers.  Meals: Start with liquid foods such as gelatin or soup. Progress to regular foods as tolerated. Avoid greasy, spicy, heavy foods. If nausea and/or vomiting occur, drink only clear liquids until the nausea and/or vomiting subsides. Call your physician if vomiting continues.  Special Instructions/Symptoms: Your throat may feel dry or sore from the anesthesia or the breathing tube placed in your throat during surgery. If this causes discomfort, gargle with warm salt water. The discomfort should disappear within 24 hours.

## 2022-06-05 NOTE — Anesthesia Postprocedure Evaluation (Signed)
Anesthesia Post Note  Patient: Lori Harvey  Procedure(s) Performed: DILATATION & CURETTAGE/HYSTEROSCOPY WITH MYOSURE (Uterus)     Patient location during evaluation: PACU Anesthesia Type: General Level of consciousness: awake and alert Pain management: pain level controlled Vital Signs Assessment: post-procedure vital signs reviewed and stable Respiratory status: spontaneous breathing, nonlabored ventilation, respiratory function stable and patient connected to nasal cannula oxygen Cardiovascular status: blood pressure returned to baseline and stable Postop Assessment: no apparent nausea or vomiting Anesthetic complications: no   No notable events documented.  Last Vitals:  Vitals:   06/05/22 0930 06/05/22 0945  BP: (!) 145/72 (!) 154/75  Pulse: 74 75  Resp: 17 15  Temp:  36.7 C  SpO2: 95% 100%    Last Pain:  Vitals:   06/05/22 0945  TempSrc:   PainSc: 0-No pain                 Barnet Glasgow

## 2022-06-05 NOTE — Transfer of Care (Signed)
Immediate Anesthesia Transfer of Care Note  Patient: Lori Harvey  Procedure(s) Performed: DILATATION & CURETTAGE/HYSTEROSCOPY WITH MYOSURE (Uterus)  Patient Location: PACU  Anesthesia Type:General  Level of Consciousness: drowsy, patient cooperative and responds to stimulation  Airway & Oxygen Therapy: Patient Spontanous Breathing and Patient connected to face mask oxygen  Post-op Assessment: Report given to RN and Post -op Vital signs reviewed and stable  Post vital signs: Reviewed and stable  Last Vitals:  Vitals Value Taken Time  BP 112/62 06/05/22 0818  Temp    Pulse 69 06/05/22 0823  Resp 14 06/05/22 0823  SpO2 100 % 06/05/22 0823  Vitals shown include unvalidated device data.  Last Pain:  Vitals:   06/05/22 0611  TempSrc: Oral  PainSc: 0-No pain      Patients Stated Pain Goal: 6 (39/[id number])  Complications: No notable events documented.

## 2022-06-06 ENCOUNTER — Encounter (HOSPITAL_BASED_OUTPATIENT_CLINIC_OR_DEPARTMENT_OTHER): Payer: Self-pay | Admitting: Obstetrics and Gynecology

## 2022-06-06 LAB — SURGICAL PATHOLOGY

## 2022-06-12 ENCOUNTER — Encounter: Payer: Self-pay | Admitting: Obstetrics and Gynecology

## 2022-06-12 ENCOUNTER — Ambulatory Visit (INDEPENDENT_AMBULATORY_CARE_PROVIDER_SITE_OTHER): Payer: Medicare Other | Admitting: Obstetrics and Gynecology

## 2022-06-12 VITALS — BP 134/72 | HR 93 | Wt 169.0 lb

## 2022-06-12 DIAGNOSIS — N8501 Benign endometrial hyperplasia: Secondary | ICD-10-CM

## 2022-06-12 MED ORDER — MEDROXYPROGESTERONE ACETATE 10 MG PO TABS: 10.0000 mg | ORAL_TABLET | Freq: Every day | ORAL | 3 refills | Status: DC

## 2022-06-12 NOTE — Progress Notes (Unsigned)
GYNECOLOGY  VISIT   HPI: 71 y.o.   Widowed White or Caucasian Not Hispanic or Latino  female   [id number] with Patient's last menstrual period was 08/29/2002 (approximate).   here for a post op visit 1 week s/p hysteroscopy, D&C. She is still having some bleeding every day.    A. ENDOMETRIUM, CURETTAGE:  Fragments atrophic squamous epithelium and minute fragments of normal  endocervical tissue.  Small fragments of endometrium with features suggestive of a benign  endometrial polyp.  Negative for hyperplasia and malignancy.  B. ENDOMETRIUM, POLYPECTOMY:  Fragments of benign endometrial polyp with foci of simple hyperplasia  without atypia.  Portions of myometrium suspicious of submucosal uterine leiomyoma.  Negative for malignancy.   GYNECOLOGIC HISTORY: Patient's last menstrual period was 08/29/2002 (approximate). 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   Prediabetes    Major depressive disorder, recurrent episode, mild ([place]) 08/24/2018   Major depressive disorder, recurrent episode, in full remission ([place]) 07/14/2018   Pain in joint of right elbow 12/04/2017   Osteopenia 02/11/2014   Benign paroxysmal positional vertigo 02/11/2014   Melanoma ([place]) 03/31/2012   History of depression 03/31/2012   Essential hypertension 03/31/2012    Past Medical History:  Diagnosis Date   Arthritis    Cancer ([place])    right leg, upper thigh AND CALF melanoma   Depression    Family history of adverse reaction to anesthesia    older sisters x 2 with ponv   GERD (gastroesophageal reflux disease)    Hypertension    Patient denies   Melanoma ([place])     Obstructive sleep apnea    uses cpap some nights set on 17 per cpap titration report 04-18-2020 epic   Osteopenia    PMB (postmenopausal bleeding)    Pre-diabetes    UTI (lower urinary tract infection)    Wears glasses     Past Surgical History:  Procedure Laterality Date   BREAST BIOPSY Left    several yrs ago   COLONOSCOPY  2020   [place] N/A 06/05/2022   Procedure: [place];  Surgeon: Salvadore Dom, MD;  Location: [facility];  Service: Gynecology;  Laterality: N/A;   MELANOMA EXCISION  2006   right thigh and calf   pre melamona area removed  10/2021   REFRACTIVE SURGERY     squamous cell area removed  11/2021   upper left leg   TONSILLECTOMY     as child   WISDOM TOOTH EXTRACTION     many yrs ago    Current Outpatient Medications  Medication Sig Dispense Refill   amLODipine (NORVASC) 2.5 MG tablet Take 1 tablet (2.5 mg total) by mouth daily. 90 tablet 3   aspirin EC 81 MG tablet Take 1 tablet (81 mg total) by mouth daily. Swallow whole. 90 tablet 3   B  Complex-C (SUPER B COMPLEX PO) Take 1 capsule by mouth daily.     buPROPion (WELLBUTRIN XL) 150 MG 24 hr tablet Take 1 tablet (150 mg total) by mouth daily. 90 tablet 3   cholecalciferol (VITAMIN D3) 25 MCG (1000 UNIT) tablet Take 1,000 Units by mouth daily.     DULoxetine (CYMBALTA) 30 MG capsule TAKE 1 CAPSULES BY MOUTH DAILY WITH '60MG'$  CAPSULE TO EQUAL TOTAL DAILY DOSE OF '90MG'$  90 capsule 3   DULoxetine (CYMBALTA) 60 MG capsule TAKE 1 CAPSULE BY MOUTH DAILY WITH '30MG'$  CAPSULE TO EQUAL TOTAL DAILY DOSE OF '90MG'$  90 capsule 3   losartan-hydrochlorothiazide (HYZAAR) 100-12.5 MG tablet TAKE 1 TABLET BY MOUTH EVERY DAY 90 tablet 0   Multiple Vitamin (MULTIVITAMIN) capsule Take 1 capsule by mouth daily.     Omega-3 Fatty Acids (FISH OIL PO) Take by mouth daily.     rosuvastatin (CRESTOR) 5 MG tablet Take 1 tablet (5 mg total) by mouth  every other day. 45 tablet 3   traZODone (DESYREL) 50 MG tablet Take 0.5-1 tablets (25-50 mg total) by mouth at bedtime as needed. for sleep 90 tablet 1   No current facility-administered medications for this visit.     ALLERGIES: Keflex [cephalexin] and Sulfa antibiotics  Family History  Problem Relation Age of Onset   Cancer Mother        breast   Stroke Mother    Breast cancer Mother    Depression Mother    Alcohol abuse Father    Heart attack Father    Other Sister 49       colon mass, benign   Hypertension Sister    Colon cancer Sister 4   Depression Sister    Colon polyps Sister    Prostate cancer Brother    Other Sister 46       colon mass- not colon cancer   Hypertension Sister    Colon polyps Sister    Anxiety disorder Maternal Aunt    Esophageal cancer Neg Hx    Rectal cancer Neg Hx    Stomach cancer Neg Hx     Social History   Socioeconomic History   Marital status: Widowed    Spouse name: Not on file   Number of children: 0   Years of education: Not on file   Highest education level: Bachelor's degree (e.g., BA, AB, BS)  Occupational History   Occupation: Retired Education officer, museum and [employer] Use   Smoking status: Never   Smokeless tobacco: Never  Vaping Use   Vaping Use: Never used  Substance and Sexual Activity   Alcohol use: Yes    Alcohol/week: 1.0 - 2.0 standard drink of alcohol    Types: 1 - 2 Glasses of wine per week    Comment: 1 to  3 glasses wine per week   Drug use: No   Sexual activity: Not Currently    Birth control/protection: Post-menopausal  Other Topics Concern   Not on file  Social History Narrative   Not on file   Social Determinants of Health   Financial Resource Strain: Low Risk  (04/09/2022)   Overall Financial Resource Strain (CARDIA)    Difficulty of Paying Living Expenses: Not hard at all  Food Insecurity: No Food Insecurity (04/09/2022)   Hunger Vital Sign    Worried About Running Out of Food in  the Last Year: Never true    Ran Out of Food in the Last Year: Never true  Transportation  Needs: No Transportation Needs (04/09/2022)   PRAPARE - Hydrologist (Medical): No    Lack of Transportation (Non-Medical): No  Physical Activity: Sufficiently Active (04/09/2022)   Exercise Vital Sign    Days of Exercise per Week: 3 days    Minutes of Exercise per Session: 60 min  Stress: Stress Concern Present (04/09/2022)   [place]    Feeling of Stress : To some extent  Social Connections: Moderately Integrated (04/09/2022)   Social Connection and Isolation Panel [NHANES]    Frequency of Communication with Friends and Family: More than three times a week    Frequency of Social Gatherings with Friends and Family: Three times a week    Attends Religious Services: More than 4 times per year    Active Member of Clubs or Organizations: Yes    Attends Archivist Meetings: More than 4 times per year    Marital Status: Widowed  Intimate Partner Violence: Not At Risk (07/04/2021)   Humiliation, Afraid, Rape, and Kick questionnaire    Fear of Current or Ex-Partner: No    Emotionally Abused: No    Physically Abused: No    Sexually Abused: No    Review of Systems  All other systems reviewed and are negative.   PHYSICAL EXAMINATION:    BP 134/72   Pulse 93   Wt 169 lb (76.7 kg)   LMP 08/29/2002 (Approximate)   SpO2 97%   BMI 27.28 kg/m     General appearance: alert, cooperative and appears stated age  81. Simple endometrial hyperplasia without atypia Her uterine cavity only sounded to 5 cm, so not a candidate for the mirena IUD - medroxyPROGESTERone (PROVERA) 10 MG tablet; Take 1 tablet (10 mg total) by mouth daily.  Dispense: 90 tablet; Refill: 3 - Endometrial biopsy; Future (6 months)

## 2022-06-14 ENCOUNTER — Encounter: Payer: Self-pay | Admitting: Obstetrics and Gynecology

## 2022-06-19 DIAGNOSIS — H2513 Age-related nuclear cataract, bilateral: Secondary | ICD-10-CM | POA: Diagnosis not present

## 2022-06-19 DIAGNOSIS — H04123 Dry eye syndrome of bilateral lacrimal glands: Secondary | ICD-10-CM | POA: Diagnosis not present

## 2022-07-17 ENCOUNTER — Ambulatory Visit (INDEPENDENT_AMBULATORY_CARE_PROVIDER_SITE_OTHER): Payer: Medicare Other

## 2022-07-17 VITALS — Ht 66.0 in | Wt 164.0 lb

## 2022-07-17 DIAGNOSIS — Z0000 Encounter for general adult medical examination without abnormal findings: Secondary | ICD-10-CM | POA: Diagnosis not present

## 2022-07-17 NOTE — Progress Notes (Signed)
I connected with  Lori Harvey today via telehealth video enabled device and verified that I am speaking with the correct person using two identifiers.   Location: Patient: home Provider: work  Persons participating in virtual visit: Lori Harvey, Lori Durand LPN  I discussed the limitations, risks, security and privacy concerns of performing an evaluation and management service by video and the availability of in person appointments. The patient expressed understanding and agreed to proceed.   Some vital signs may be absent or patient reported.     Subjective:   Lori Harvey is a 71 y.o. female who presents for Medicare Annual (Subsequent) preventive examination.  Review of Systems     Cardiac Risk Factors include: advanced age (>103mn, >>76women);hypertension     Objective:    Today's Vitals   07/17/22 1053  Weight: 164 lb (74.4 kg)  Height: '5\' 6"'$  (1.676 m)   Body mass index is 26.47 kg/m.     07/17/2022   11:00 AM 06/05/2022    6:04 AM 07/04/2021   11:51 AM 04/18/2020    8:25 PM 12/30/2018    2:29 PM  Advanced Directives  Does Patient Have a Medical Advance Directive? Yes Yes Yes Yes Yes  Type of AParamedicof AJeisyvilleLiving will HLa Grange ParkLiving will Living will HCopiahLiving will HLittle MeadowsLiving will  Copy of HHarrisonin Chart? No - copy requested No - copy requested  No - copy requested     Current Medications (verified) Outpatient Encounter Medications as of 07/17/2022  Medication Sig   amLODipine (NORVASC) 2.5 MG tablet Take 1 tablet (2.5 mg total) by mouth daily.   aspirin EC 81 MG tablet Take 1 tablet (81 mg total) by mouth daily. Swallow whole.   buPROPion (WELLBUTRIN XL) 150 MG 24 hr tablet Take 1 tablet (150 mg total) by mouth daily.   cholecalciferol (VITAMIN D3) 25 MCG (1000 UNIT) tablet Take 1,000 Units by mouth daily.   Co-Enzyme Q10 100 MG  CAPS Take 1 capsule by mouth daily.   DULoxetine (CYMBALTA) 30 MG capsule TAKE 1 CAPSULES BY MOUTH DAILY WITH '60MG'$  CAPSULE TO EQUAL TOTAL DAILY DOSE OF '90MG'$    DULoxetine (CYMBALTA) 60 MG capsule TAKE 1 CAPSULE BY MOUTH DAILY WITH '30MG'$  CAPSULE TO EQUAL TOTAL DAILY DOSE OF '90MG'$    losartan-hydrochlorothiazide (HYZAAR) 100-12.5 MG tablet TAKE 1 TABLET BY MOUTH EVERY DAY   medroxyPROGESTERone (PROVERA) 10 MG tablet Take 1 tablet (10 mg total) by mouth daily.   Multiple Vitamin (MULTIVITAMIN) capsule Take 1 capsule by mouth daily.   Omega-3 Fatty Acids (FISH OIL PO) Take by mouth daily.   rosuvastatin (CRESTOR) 5 MG tablet Take 1 tablet (5 mg total) by mouth every other day.   traZODone (DESYREL) 50 MG tablet Take 0.5-1 tablets (25-50 mg total) by mouth at bedtime as needed. for sleep   B Complex-C (SUPER B COMPLEX PO) Take 1 capsule by mouth daily. (Patient not taking: Reported on 07/17/2022)   No facility-administered encounter medications on file as of 07/17/2022.    Allergies (verified) Keflex [cephalexin] and Sulfa antibiotics   History: Past Medical History:  Diagnosis Date   Arthritis    Cancer ([place]    right leg, upper thigh AND CALF melanoma   Depression    Family history of adverse reaction to anesthesia    older sisters x 2 with ponv   GERD (gastroesophageal reflux disease)    Hypertension    Patient  denies   Melanoma ([place])    Obstructive sleep apnea    uses cpap some nights set on 17 per cpap titration report 04-18-2020 epic   Osteopenia    PMB (postmenopausal bleeding)    Pre-diabetes    UTI (lower urinary tract infection)    Wears glasses    Past Surgical History:  Procedure Laterality Date   BREAST BIOPSY Left    several yrs ago   COLONOSCOPY  [place] N/A 06/05/2022   Procedure: [place];  Surgeon: Salvadore Dom, MD;  Location: [facility];  Service:  Gynecology;  Laterality: N/A;   MELANOMA EXCISION  2006   right thigh and calf   pre melamona area removed  10/2021   REFRACTIVE SURGERY     squamous cell area removed  11/2021   upper left leg   TONSILLECTOMY     as child   WISDOM TOOTH EXTRACTION     many yrs ago   Family History  Problem Relation Age of Onset   Cancer Mother        breast   Stroke Mother    Breast cancer Mother    Depression Mother    Alcohol abuse Father    Heart attack Father    Other Sister 53       colon mass, benign   Hypertension Sister    Colon cancer Sister 8   Depression Sister    Colon polyps Sister    Prostate cancer Brother    Other Sister 12       colon mass- not colon cancer   Hypertension Sister    Colon polyps Sister    Anxiety disorder Maternal Aunt    Esophageal cancer Neg Hx    Rectal cancer Neg Hx    Stomach cancer Neg Hx    Social History   Socioeconomic History   Marital status: Widowed    Spouse name: Not on file   Number of children: 0   Years of education: Not on file   Highest education level: Bachelor's degree (e.g., BA, AB, BS)  Occupational History   Occupation: Retired Education officer, 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   Smoking status: Never   Smokeless tobacco: Never  Vaping Use   Vaping Use: Never used  Substance and Sexual Activity   Alcohol use: Yes    Alcohol/week: 1.0 - 2.0 standard drink of alcohol    Types: 1 - 2 Glasses of wine per week    Comment: 1 to  3 glasses wine per week   Drug use: No   Sexual activity: Not Currently    Birth control/protection: Post-menopausal  Other Topics Concern   Not on file  Social History Narrative   Not on file   Social Determinants of Health   Financial Resource Strain: Low Risk  (07/17/2022)   Overall Financial Resource Strain (CARDIA)    Difficulty of Paying Living Expenses: Not hard at all  Food Insecurity: No Food Insecurity (07/17/2022)   Hunger Vital Sign    Worried About Running Out of Food in the  Last Year: Never true    Ran Out of Food in the Last Year: Never true  Transportation Needs: No Transportation Needs (07/17/2022)   PRAPARE - Hydrologist (Medical): No    Lack of Transportation (Non-Medical): No  Physical Activity: Sufficiently Active (07/17/2022)   Exercise Vital Sign  Days of Exercise per Week: 4 days    Minutes of Exercise per Session: 60 min  Stress: No Stress Concern Present (07/17/2022)   [place]    Feeling of Stress : Not at all  Social Connections: Moderately Integrated (04/09/2022)   Social Connection and Isolation Panel [NHANES]    Frequency of Communication with Friends and Family: More than three times a week    Frequency of Social Gatherings with Friends and Family: Three times a week    Attends Religious Services: More than 4 times per year    Active Member of Clubs or Organizations: Yes    Attends Archivist Meetings: More than 4 times per year    Marital Status: Widowed    Tobacco Counseling Counseling given: Not Answered   Clinical Intake:  Pre-visit preparation completed: Yes  Pain : No/denies pain     Nutritional Status: BMI 25 -29 Overweight Nutritional Risks: None Diabetes: No  How often do you need to have someone help you when you read instructions, pamphlets, or other written materials from your doctor or pharmacy?: 1 - Never What is the last grade level you completed in school?: BA degree  Diabetic? no  Interpreter Needed?: No  Information entered by :: NAllen LPN   Activities of Daily Living    07/17/2022   11:01 AM 06/05/2022    6:13 AM  In your present state of health, do you have any difficulty performing the following activities:  Hearing? 0 0  Vision? 0 0  Difficulty concentrating or making decisions? 0 0  Walking or climbing stairs? 0 0  Dressing or bathing? 0 0  Doing errands, shopping? 0   Preparing Food  and eating ? N   Using the Toilet? N   In the past six months, have you accidently leaked urine? Y   Do you have problems with loss of bowel control? N   Managing your Medications? N   Managing your Finances? N   Housekeeping or managing your Housekeeping? N     Patient Care Team: Eulas Post, MD as PCP - General (Family Medicine) Salvadore Dom, MD as Consulting Physician (Obstetrics and Gynecology)  Indicate any recent Medical Services you may have received from other than [facility] providers in the past year (date may be approximate).     Assessment:   This is a routine wellness examination for [place].  Hearing/Vision screen Vision Screening - Comments:: Regular eye exams, [facility]  Dietary issues and exercise activities discussed: Current Exercise Habits: Home exercise routine, Type of exercise: walking, Time (Minutes): 60, Frequency (Times/Week): 4, Weekly Exercise (Minutes/Week): 240   Goals Addressed             This Visit's Progress    Patient Stated       07/17/2022, wants to lose weight       Depression Screen    07/17/2022   11:01 AM 07/04/2021   11:50 AM 03/21/2020    8:27 AM 02/17/2019   11:09 AM 12/24/2014    8:29 AM  PHQ 2/9 Scores  PHQ - 2 Score 0 0 0 0 0    Fall Risk    07/17/2022   11:00 AM 04/09/2022   11:43 AM 07/04/2021   11:53 AM 03/21/2020    8:27 AM 12/24/2014    8:29 AM  Fall Risk   Falls in the past year? 0 0 0 0 No  Number falls in past  yr: 0  0 0   Injury with Fall? 0  0 0   Risk for fall due to : Medication side effect  Impaired vision    Follow up Falls prevention discussed;Education provided;Falls evaluation completed  Falls prevention discussed Falls evaluation completed     FALL RISK PREVENTION PERTAINING TO THE HOME:  Any stairs in or around the home? Yes  If so, are there any without handrails? No  Home free of loose throw rugs in walkways, pet beds, electrical cords, etc? Yes  Adequate lighting in your home to  reduce risk of falls? Yes   ASSISTIVE DEVICES UTILIZED TO PREVENT FALLS:  Life alert? No  Use of a cane, walker or w/c? No  Grab bars in the bathroom? Yes  Shower chair or bench in shower? Yes  Elevated toilet seat or a handicapped toilet? No   TIMED UP AND GO:  Was the test performed? No .      Cognitive Function:        07/17/2022   11:04 AM 07/04/2021   11:54 AM  6CIT Screen  What Year? 0 points 0 points  What month? 0 points 0 points  What time? 0 points 0 points  Count back from 20 0 points 0 points  Months in reverse 0 points 0 points  Repeat phrase 0 points 0 points  Total Score 0 points 0 points    Immunizations Immunization History  Administered Date(s) Administered   Fluad Quad(high Dose 65+) 07/11/2022   Influenza Split 08/16/2014   Influenza, High Dose Seasonal PF 08/25/2015, 08/17/2019   Influenza-Unspecified 08/15/2016, 08/15/2017, 08/18/2021   PFIZER(Purple Top)SARS-COV-2 Vaccination 11/24/2019, 12/15/2019, 08/02/2020   Pneumococcal Conjugate-13 12/24/2014   Pneumococcal Polysaccharide-23 12/17/2016   Pneumococcal-Unspecified 12/17/2016   Td 04/01/2003   Tdap 02/11/2014   Zoster Recombinat (Shingrix) 02/09/2017, 04/11/2017   Zoster, Live 05/10/2010    TDAP status: Up to date  Flu Vaccine status: Up to date  Pneumococcal vaccine status: Up to date  Covid-19 vaccine status: Completed vaccines  Qualifies for Shingles Vaccine? Yes   Zostavax completed Yes   Shingrix Completed?: Yes  Screening Tests Health Maintenance  Topic Date Due   COVID-19 Vaccine (4 - Pfizer risk series) 09/27/2020   MAMMOGRAM  04/14/2023   COLONOSCOPY (Pts 45-83yr Insurance coverage will need to be confirmed)  01/12/2024   TETANUS/TDAP  02/12/2024   Pneumonia Vaccine 71 Years old  Completed   INFLUENZA VACCINE  Completed   DEXA SCAN  Completed   Hepatitis C Screening  Completed   Zoster Vaccines- Shingrix  Completed   HPV VACCINES  Aged Out    Health  Maintenance  Health Maintenance Due  Topic Date Due   COVID-19 Vaccine (4 - Pfizer risk series) 09/27/2020    Colorectal cancer screening: Type of screening: Colonoscopy. Completed 01/12/2019. Repeat every 5 years  Mammogram status: Completed 04/13/2022. Repeat every year  Bone Density status: Completed 02/11/2020.   Lung Cancer Screening: (Low Dose CT Chest recommended if Age 73-80years, 30 pack-year currently smoking OR have quit w/in 15years.) does not qualify.   Lung Cancer Screening Referral: no  Additional Screening:  Hepatitis C Screening: does qualify; Completed 07/16/2016  Vision Screening: Recommended annual ophthalmology exams for early detection of glaucoma and other disorders of the eye. Is the patient up to date with their annual eye exam?  Yes  Who is the provider or what is the name of the office in which the patient attends annual eye exams? [facility]  If pt is not established with a provider, would they like to be referred to a provider to establish care? No .   Dental Screening: Recommended annual dental exams for proper oral hygiene  Community Resource Referral / Chronic Care Management: CRR required this visit?  No   CCM required this visit?  No      Plan:     I have personally reviewed and noted the following in the patient's chart:   Medical and social history Use of alcohol, tobacco or illicit drugs  Current medications and supplements including opioid prescriptions. Patient is not currently taking opioid prescriptions. Functional ability and status Nutritional status Physical activity Advanced directives List of other physicians Hospitalizations, surgeries, and ER visits in previous 12 months Vitals Screenings to include cognitive, depression, and falls Referrals and appointments  In addition, I have reviewed and discussed with patient certain preventive protocols, quality metrics, and best practice recommendations. A written  personalized care plan for preventive services as well as general preventive health recommendations were provided to patient.     Kellie Simmering, LPN   9/38/1829   Nurse Notes: none  Due to this being a virtual visit, the after visit summary with patients personalized plan was offered to patient via mail or my-chart.  Patient would like to access on my-chart

## 2022-07-17 NOTE — Patient Instructions (Signed)
Lori Harvey , Thank you for taking time to come for your Medicare Wellness Visit. I appreciate your ongoing commitment to your health goals. Please review the following plan we discussed and let me know if I can assist you in the future.   Screening recommendations/referrals: Colonoscopy: completed 01/12/2019, due 01/12/2024 Mammogram: completed 04/13/2022, due 04/15/2023 Bone Density: completed 02/11/2020 Recommended yearly ophthalmology/optometry visit for glaucoma screening and checkup Recommended yearly dental visit for hygiene and checkup  Vaccinations: Influenza vaccine: completed 07/11/2022 Pneumococcal vaccine: completed 12/17/2016 Tdap vaccine: completed 02/11/2014, due 02/12/2024 Shingles vaccine: completed   Covid-19: 08/02/2020, 12/15/2019, 11/24/2019  Advanced directives: Please bring a copy of your POA (Power of Attorney) and/or Living Will to your next appointment.   Conditions/risks identified: none  Next appointment: Follow up in one year for your annual wellness visit    Preventive Care 65 Years and Older, Female Preventive care refers to lifestyle choices and visits with your health care provider that can promote health and wellness. What does preventive care include? A yearly physical exam. This is also called an annual well check. Dental exams once or twice a year. Routine eye exams. Ask your health care provider how often you should have your eyes checked. Personal lifestyle choices, including: Daily care of your teeth and gums. Regular physical activity. Eating a healthy diet. Avoiding tobacco and drug use. Limiting alcohol use. Practicing safe sex. Taking low-dose aspirin every day. Taking vitamin and mineral supplements as recommended by your health care provider. What happens during an annual well check? The services and screenings done by your health care provider during your annual well check will depend on your age, overall health, lifestyle risk factors, and  family history of disease. Counseling  Your health care provider may ask you questions about your: Alcohol use. Tobacco use. Drug use. Emotional well-being. Home and relationship well-being. Sexual activity. Eating habits. History of falls. Memory and ability to understand (cognition). Work and work Statistician. Reproductive health. Screening  You may have the following tests or measurements: Height, weight, and BMI. Blood pressure. Lipid and cholesterol levels. These may be checked every 5 years, or more frequently if you are over 73 years old. Skin check. Lung cancer screening. You may have this screening every year starting at age 73 if you have a 30-pack-year history of smoking and currently smoke or have quit within the past 15 years. Fecal occult blood test (FOBT) of the stool. You may have this test every year starting at age 65. Flexible sigmoidoscopy or colonoscopy. You may have a sigmoidoscopy every 5 years or a colonoscopy every 10 years starting at age 73. Hepatitis C blood test. Hepatitis B blood test. Sexually transmitted disease (STD) testing. Diabetes screening. This is done by checking your blood sugar (glucose) after you have not eaten for a while (fasting). You may have this done every 1-3 years. Bone density scan. This is done to screen for osteoporosis. You may have this done starting at age 73. Mammogram. This may be done every 1-2 years. Talk to your health care provider about how often you should have regular mammograms. Talk with your health care provider about your test results, treatment options, and if necessary, the need for more tests. Vaccines  Your health care provider may recommend certain vaccines, such as: Influenza vaccine. This is recommended every year. Tetanus, diphtheria, and acellular pertussis (Tdap, Td) vaccine. You may need a Td booster every 10 years. Zoster vaccine. You may need this after age 73. Pneumococcal 13-valent conjugate  (  PCV13) vaccine. One dose is recommended after age 73. Pneumococcal polysaccharide (PPSV23) vaccine. One dose is recommended after age 73. Talk to your health care provider about which screenings and vaccines you need and how often you need them. This information is not intended to replace advice given to you by your health care provider. Make sure you discuss any questions you have with your health care provider. Document Released: 11/11/2015 Document Revised: 07/04/2016 Document Reviewed: 08/16/2015 Elsevier Interactive Patient Education  2017 [place] Prevention in the Home Falls can cause injuries. They can happen to people of all ages. There are many things you can do to make your home safe and to help prevent falls. What can I do on the outside of my home? Regularly fix the edges of walkways and driveways and fix any cracks. Remove anything that might make you trip as you walk through a door, such as a raised step or threshold. Trim any bushes or trees on the path to your home. Use bright outdoor lighting. Clear any walking paths of anything that might make someone trip, such as rocks or tools. Regularly check to see if handrails are loose or broken. Make sure that both sides of any steps have handrails. Any raised decks and porches should have guardrails on the edges. Have any leaves, snow, or ice cleared regularly. Use sand or salt on walking paths during winter. Clean up any spills in your garage right away. This includes oil or grease spills. What can I do in the bathroom? Use night lights. Install grab bars by the toilet and in the tub and shower. Do not use towel bars as grab bars. Use non-skid mats or decals in the tub or shower. If you need to sit down in the shower, use a plastic, non-slip stool. Keep the floor dry. Clean up any water that spills on the floor as soon as it happens. Remove soap buildup in the tub or shower regularly. Attach bath mats securely with  double-sided non-slip rug tape. Do not have throw rugs and other things on the floor that can make you trip. What can I do in the bedroom? Use night lights. Make sure that you have a light by your bed that is easy to reach. Do not use any sheets or blankets that are too big for your bed. They should not hang down onto the floor. Have a firm chair that has side arms. You can use this for support while you get dressed. Do not have throw rugs and other things on the floor that can make you trip. What can I do in the kitchen? Clean up any spills right away. Avoid walking on wet floors. Keep items that you use a lot in easy-to-reach places. If you need to reach something above you, use a strong step stool that has a grab bar. Keep electrical cords out of the way. Do not use floor polish or wax that makes floors slippery. If you must use wax, use non-skid floor wax. Do not have throw rugs and other things on the floor that can make you trip. What can I do with my stairs? Do not leave any items on the stairs. Make sure that there are handrails on both sides of the stairs and use them. Fix handrails that are broken or loose. Make sure that handrails are as long as the stairways. Check any carpeting to make sure that it is firmly attached to the stairs. Fix any carpet that is loose  or worn. Avoid having throw rugs at the top or bottom of the stairs. If you do have throw rugs, attach them to the floor with carpet tape. Make sure that you have a light switch at the top of the stairs and the bottom of the stairs. If you do not have them, ask someone to add them for you. What else can I do to help prevent falls? Wear shoes that: Do not have high heels. Have rubber bottoms. Are comfortable and fit you well. Are closed at the toe. Do not wear sandals. If you use a stepladder: Make sure that it is fully opened. Do not climb a closed stepladder. Make sure that both sides of the stepladder are locked  into place. Ask someone to hold it for you, if possible. Clearly mark and make sure that you can see: Any grab bars or handrails. First and last steps. Where the edge of each step is. Use tools that help you move around (mobility aids) if they are needed. These include: Canes. Walkers. Scooters. Crutches. Turn on the lights when you go into a dark area. Replace any light bulbs as soon as they burn out. Set up your furniture so you have a clear path. Avoid moving your furniture around. If any of your floors are uneven, fix them. If there are any pets around you, be aware of where they are. Review your medicines with your doctor. Some medicines can make you feel dizzy. This can increase your chance of falling. Ask your doctor what other things that you can do to help prevent falls. This information is not intended to replace advice given to you by your health care provider. Make sure you discuss any questions you have with your health care provider. Document Released: 08/11/2009 Document Revised: 03/22/2016 Document Reviewed: 11/19/2014 Elsevier Interactive Patient Education  2017 [employer].

## 2022-08-03 ENCOUNTER — Other Ambulatory Visit: Payer: Self-pay | Admitting: Family Medicine

## 2022-08-06 ENCOUNTER — Telehealth: Payer: Self-pay | Admitting: *Deleted

## 2022-08-06 NOTE — Patient Outreach (Signed)
  Care Coordination   08/06/2022 Name: Lori Harvey MRN: [id number] DOB: 05/12/1951   Care Coordination Outreach Attempts:  An unsuccessful telephone outreach was attempted today to offer the patient information about available care coordination services as a benefit of their health plan.   Follow Up Plan:  Additional outreach attempts will be made to offer the patient care coordination information and services.   Encounter Outcome:  No Answer  Care Coordination Interventions Activated:  No   Care Coordination Interventions:  No, not indicated    Raina Mina, RN Care Management Coordinator [place] Office [phone]

## 2022-08-09 ENCOUNTER — Telehealth: Payer: Self-pay | Admitting: *Deleted

## 2022-08-09 NOTE — Patient Outreach (Signed)
  Care Coordination   08/09/2022 Name: Lori Harvey MRN: [id number] DOB: 1951/09/18   Care Coordination Outreach Attempts:  A second unsuccessful outreach was attempted today to offer the patient with information about available care coordination services as a benefit of their health plan.     Follow Up Plan:  Additional outreach attempts will be made to offer the patient care coordination information and services.   Encounter Outcome:  No Answer  Care Coordination Interventions Activated:  No   Care Coordination Interventions:  No, not indicated    Raina Mina, RN Care Management Coordinator [place] Office [phone]

## 2022-08-14 DIAGNOSIS — H04123 Dry eye syndrome of bilateral lacrimal glands: Secondary | ICD-10-CM | POA: Diagnosis not present

## 2022-08-20 ENCOUNTER — Telehealth: Payer: Self-pay | Admitting: *Deleted

## 2022-08-20 NOTE — Patient Outreach (Signed)
  Care Coordination   08/20/2022 Name: Lori Harvey MRN: [id number] DOB: 1951-10-07   Care Coordination Outreach Attempts:  A third unsuccessful outreach was attempted today to offer the patient with information about available care coordination services as a benefit of their health plan.   Follow Up Plan:  No further outreach attempts will be made at this time. We have been unable to contact the patient to offer or enroll patient in care coordination services  Encounter Outcome:  No Answer  Care Coordination Interventions Activated:  No   Care Coordination Interventions:  No, not indicated    Raina Mina, RN Care Management Coordinator [place] Office [phone]

## 2022-09-07 ENCOUNTER — Encounter: Payer: Self-pay | Admitting: Psychiatry

## 2022-09-07 ENCOUNTER — Ambulatory Visit (INDEPENDENT_AMBULATORY_CARE_PROVIDER_SITE_OTHER): Payer: Medicare Other | Admitting: Psychiatry

## 2022-09-07 DIAGNOSIS — F5101 Primary insomnia: Secondary | ICD-10-CM

## 2022-09-07 DIAGNOSIS — F3342 Major depressive disorder, recurrent, in full remission: Secondary | ICD-10-CM

## 2022-09-07 MED ORDER — BELSOMRA 10 MG PO TABS: 10.0000 mg | ORAL_TABLET | Freq: Every day | ORAL | 0 refills | Status: DC

## 2022-09-07 MED ORDER — BELSOMRA 20 MG PO TABS: 20.0000 mg | ORAL_TABLET | Freq: Every day | ORAL | 0 refills | Status: DC

## 2022-09-07 MED ORDER — DULOXETINE HCL 60 MG PO CPEP: ORAL_CAPSULE | ORAL | 1 refills | Status: DC

## 2022-09-07 MED ORDER — BUPROPION HCL ER (XL) 150 MG PO TB24: 150.0000 mg | ORAL_TABLET | Freq: Every day | ORAL | 1 refills | Status: DC

## 2022-09-07 MED ORDER — BELSOMRA 15 MG PO TABS: 15.0000 mg | ORAL_TABLET | Freq: Every day | ORAL | 0 refills | Status: DC

## 2022-09-07 MED ORDER — DULOXETINE HCL 30 MG PO CPEP: ORAL_CAPSULE | ORAL | 1 refills | Status: DC

## 2022-09-07 NOTE — Progress Notes (Signed)
Lori Harvey [id number] Dec 24, 1950 71 y.o.  Subjective:   Patient ID:  Lori Harvey is a 71 y.o. (DOB Feb 20, 1951) female.  Chief Complaint:  Chief Complaint  Patient presents with   Follow-up    Anxiety, depression, and insomnia    HPI Lori Harvey presents to the office today for follow-up of anxiety, depression, and insomnia. She reports that she has been doing well. She reports that her mood has been stable. Denies any significant anxiety. Has had some situational stress related to law suit. She would like to consider reduction in medication in the future after lawsuit with contractor. Concentration has been ok and had to focus with preparation for a lawsuit. She reports that she does not feel as alert after taking Trazodone 25 mg po QHS. She stopped taking Trazodone. She reports sleeping about 6 hours a night without Trazodone. Appetite has been good. Denies SI.   Past Psychiatric Medication Trials: Sertraline Lexapro Celexa Cymbalta Wellbutrin Ambien Valium Trazodone Ambien    AUDIT    Flowsheet Row Office Visit from 04/13/2022 in [place] at [place]  Alcohol Use Disorder Identification Test Final Score (AUDIT) 3      PHQ2-9    Flowsheet Row Clinical Support from 07/17/2022 in [facility] at [place] from 07/04/2021 in [place] [facility] [place] at [facility] from 03/21/2020 in [place] at [facility] from 02/17/2019 in [place] at [facility] from 12/24/2014 in [facility] at [place]  PHQ-2 Total Score 0 0 0 0 0      Flowsheet Row Admission (Discharged) from 06/05/2022 in [facility] No Risk        Review of Systems:  Review of Systems  Medications: I have reviewed the patient's current medications.  Current Outpatient Medications  Medication Sig Dispense Refill   amLODipine (NORVASC) 2.5 MG tablet Take 1 tablet (2.5 mg total) by mouth daily.  90 tablet 3   aspirin EC 81 MG tablet Take 1 tablet (81 mg total) by mouth daily. Swallow whole. 90 tablet 3   Co-Enzyme Q10 100 MG CAPS Take 1 capsule by mouth daily.     losartan-hydrochlorothiazide (HYZAAR) 100-12.5 MG tablet TAKE 1 TABLET BY MOUTH EVERY DAY 90 tablet 0   medroxyPROGESTERone (PROVERA) 10 MG tablet Take 1 tablet (10 mg total) by mouth daily. 90 tablet 3   Multiple Vitamin (MULTIVITAMIN) capsule Take 1 capsule by mouth daily.     Omega-3 Fatty Acids (FISH OIL PO) Take by mouth daily.     rosuvastatin (CRESTOR) 5 MG tablet Take 1 tablet (5 mg total) by mouth every other day. 45 tablet 3   Suvorexant (BELSOMRA) 10 MG TABS Take 10 mg by mouth at bedtime. 10 tablet 0   Suvorexant (BELSOMRA) 15 MG TABS Take 15 mg by mouth at bedtime. 10 tablet 0   Suvorexant (BELSOMRA) 20 MG TABS Take 20 mg by mouth at bedtime for 10 days. 10 tablet 0   buPROPion (WELLBUTRIN XL) 150 MG 24 hr tablet Take 1 tablet (150 mg total) by mouth daily. 90 tablet 1   cholecalciferol (VITAMIN D3) 25 MCG (1000 UNIT) tablet Take 1,000 Units by mouth daily. (Patient not taking: Reported on 09/07/2022)     DULoxetine (CYMBALTA) 30 MG capsule TAKE 1 CAPSULES BY MOUTH DAILY WITH '60MG'$  CAPSULE TO EQUAL TOTAL DAILY DOSE OF '90MG'$  90 capsule 1   DULoxetine (CYMBALTA) 60 MG capsule TAKE 1 CAPSULE BY MOUTH DAILY WITH '30MG'$  CAPSULE TO EQUAL TOTAL DAILY DOSE  OF '90MG'$  90 capsule 1   No current facility-administered medications for this visit.    Medication Side Effects: Dry mouth  Allergies:  Allergies  Allergen Reactions   Keflex [Cephalexin]     Nausea, hives   Sulfa Antibiotics Rash    Past Medical History:  Diagnosis Date   Arthritis    Cancer ([place])    right leg, upper thigh AND CALF melanoma   Depression    Family history of adverse reaction to anesthesia    older sisters x 2 with ponv   GERD (gastroesophageal reflux disease)    Hypertension    Patient denies   Melanoma ([place])    Obstructive sleep apnea     uses cpap some nights set on 17 per cpap titration report 04-18-2020 epic   Osteopenia    PMB (postmenopausal bleeding)    Pre-diabetes    UTI (lower urinary tract infection)    Wears glasses     Past Medical History, Surgical history, Social history, and Family history were reviewed and updated as appropriate.   Please see review of systems for further details on the patient's review from today.   Objective:   Physical Exam:  LMP 08/29/2002 (Approximate)   Physical Exam Constitutional:      General: She is not in acute distress. Musculoskeletal:        General: No deformity.  Neurological:     Mental Status: She is alert and oriented to person, place, and time.     Coordination: Coordination normal.  Psychiatric:        Attention and Perception: Attention and perception normal. She does not perceive auditory or visual hallucinations.        Mood and Affect: Mood normal. Mood is not anxious or depressed. Affect is not labile, blunt, angry or inappropriate.        Speech: Speech normal.        Behavior: Behavior normal.        Thought Content: Thought content normal. Thought content is not paranoid or delusional. Thought content does not include homicidal or suicidal ideation. Thought content does not include homicidal or suicidal plan.        Cognition and Memory: Cognition and memory normal.        Judgment: Judgment normal.     Comments: Insight intact     Lab Review:     Component Value Date/Time   NA 137 03/07/2021 1357   NA 139 09/08/2019 1045   K 4.1 03/07/2021 1357   CL 99 03/07/2021 1357   CO2 31 03/07/2021 1357   GLUCOSE 100 (H) 03/07/2021 1357   BUN 16 03/07/2021 1357   BUN 11 09/08/2019 1045   CREATININE 0.99 03/07/2021 1357   CREATININE 0.87 07/16/2016 1455   CALCIUM 9.3 03/07/2021 1357   PROT 6.8 03/07/2021 1357   PROT 6.6 09/08/2019 1045   ALBUMIN 4.3 03/07/2021 1357   ALBUMIN 4.2 09/08/2019 1045   AST 19 03/07/2021 1357   ALT 17 03/07/2021 1357    ALKPHOS 70 03/07/2021 1357   BILITOT 0.6 03/07/2021 1357   BILITOT 0.4 09/08/2019 1045   GFRNONAA 62 09/08/2019 1045   GFRAA 72 09/08/2019 1045       Component Value Date/Time   WBC 7.3 03/07/2021 1357   RBC 4.94 03/07/2021 1357   HGB 13.7 03/07/2021 1357   HGB 13.7 09/08/2019 1045   HGB 13.7 02/11/2014 1304   HCT 41.5 03/07/2021 1357   HCT 41.9 09/08/2019 1045   PLT  299.0 03/07/2021 1357   PLT 340 09/08/2019 1045   MCV 83.9 03/07/2021 1357   MCV 85 09/08/2019 1045   MCH 27.7 09/08/2019 1045   MCH 28.1 07/16/2016 1455   MCHC 33.1 03/07/2021 1357   RDW 13.9 03/07/2021 1357   RDW 13.1 09/08/2019 1045   LYMPHSABS 2.5 03/07/2021 1357   MONOABS 0.4 03/07/2021 1357   EOSABS 0.1 03/07/2021 1357   BASOSABS 0.0 03/07/2021 1357    No results found for: "POCLITH", "LITHIUM"   No results found for: "PHENYTOIN", "PHENOBARB", "VALPROATE", "CBMZ"   .res Assessment: Plan:    Pt seen for 30 minutes and time spent discussing recent insomnia. Discussed potential benefits, risks, and and side effects of Belsomra for insomnia.  Patient agrees to trial of Belsomra.  Patient provided with Belsomra 15 mg samples and advised to take 1 tablet by mouth at bedtime for 9 days, and to increase to 20 mg tabs if 15 mg dose is ineffective and not experiencing any side effects.  Patient advised to contact office if Belsomra is effective and to request a script for the dose that is effective. Continue Wellbutrin XL 150 mg po qd for depression.  Continue Cymbalta 90 mg po qd for anxiety and depression. Pt to follow-up in 6 months or sooner if clinically indicated.  Patient advised to contact office with any questions, adverse effects, or acute worsening in signs and symptoms.   Lori Harvey was seen today for follow-up.  Diagnoses and all orders for this visit:  Major depressive disorder, recurrent episode, in full remission (HCC) -     DULoxetine (CYMBALTA) 60 MG capsule; TAKE 1 CAPSULE BY MOUTH DAILY  WITH '30MG'$  CAPSULE TO EQUAL TOTAL DAILY DOSE OF '90MG'$  -     DULoxetine (CYMBALTA) 30 MG capsule; TAKE 1 CAPSULES BY MOUTH DAILY WITH '60MG'$  CAPSULE TO EQUAL TOTAL DAILY DOSE OF '90MG'$  -     buPROPion (WELLBUTRIN XL) 150 MG 24 hr tablet; Take 1 tablet (150 mg total) by mouth daily.  Primary insomnia -     Suvorexant (BELSOMRA) 15 MG TABS; Take 15 mg by mouth at bedtime. -     Suvorexant (BELSOMRA) 20 MG TABS; Take 20 mg by mouth at bedtime for 10 days. -     Suvorexant (BELSOMRA) 10 MG TABS; Take 10 mg by mouth at bedtime.     Please see After Visit Summary for patient specific instructions.  Future Appointments  Date Time Provider [place]  12/11/2022 11:00 AM Parrett, Fonnie Mu, NP [facility] None  12/13/2022  2:00 PM Salvadore Dom, MD [facility] None  03/08/2023 12:45 PM Thayer Headings, PMHNP [facility] None  07/22/2023 11:00 AM [facility] ADVIS[facility] 2 [facility]    No orders of the defined types were placed in this encounter.   -------------------------------

## 2022-10-01 ENCOUNTER — Telehealth: Payer: Self-pay | Admitting: Psychiatry

## 2022-10-01 DIAGNOSIS — F5101 Primary insomnia: Secondary | ICD-10-CM

## 2022-10-01 MED ORDER — BELSOMRA 15 MG PO TABS: 15.0000 mg | ORAL_TABLET | Freq: Every day | ORAL | 5 refills | Status: DC

## 2022-10-01 NOTE — Telephone Encounter (Signed)
Please let her know script was sent. Ok to pull samples if available and needed in case Belsomra requires a PA.

## 2022-10-01 NOTE — Telephone Encounter (Signed)
Pt LVM @ 11:11a.  She got samples from [facility].  She would like a script sent in for the '15mg'$  to [facility].  Next appt 5/10

## 2022-10-01 NOTE — Telephone Encounter (Signed)
Pt has been getting samples and requesting rx

## 2022-10-02 NOTE — Telephone Encounter (Signed)
LVM with info

## 2022-10-14 ENCOUNTER — Telehealth: Payer: Medicare Other | Admitting: Physician Assistant

## 2022-10-14 DIAGNOSIS — B9689 Other specified bacterial agents as the cause of diseases classified elsewhere: Secondary | ICD-10-CM

## 2022-10-14 DIAGNOSIS — J069 Acute upper respiratory infection, unspecified: Secondary | ICD-10-CM

## 2022-10-15 MED ORDER — BENZONATATE 100 MG PO CAPS: 100.0000 mg | ORAL_CAPSULE | Freq: Three times a day (TID) | ORAL | 0 refills | Status: DC | PRN

## 2022-10-15 MED ORDER — AMOXICILLIN 500 MG PO CAPS: 500.0000 mg | ORAL_CAPSULE | Freq: Two times a day (BID) | ORAL | 0 refills | Status: AC

## 2022-10-15 NOTE — Progress Notes (Signed)
E-Visit for Sore Throat - Strep Symptoms  We are sorry that you are not feeling well.  Here is how we plan to help!  Based on what you have shared with me it is likely that you have strep pharyngitis.  Strep pharyngitis is inflammation and infection in the back of the throat.  This is an infection cause by bacteria and is treated with antibiotics.  I have prescribed Amoxicillin 500 mg twice a day for 10 days. I have also prescribed Tessalon perles '100mg'$  Take 1 capsule every 8 hours for cough. For throat pain, we recommend over the counter oral pain relief medications such as acetaminophen or aspirin, or anti-inflammatory medications such as ibuprofen or naproxen sodium. Topical treatments such as oral throat lozenges or sprays may be used as needed. Strep infections are not as easily transmitted as other respiratory infections, however we still recommend that you avoid close contact with loved ones, especially the very young and elderly.  Remember to wash your hands thoroughly throughout the day as this is the number one way to prevent the spread of infection and wipe down door knobs and counters with disinfectant.   Home Care: Only take medications as instructed by your medical team. Complete the entire course of an antibiotic. Do not take these medications with alcohol. A steam or ultrasonic humidifier can help congestion.  You can place a towel over your head and breathe in the steam from hot water coming from a faucet. Avoid close contacts especially the very young and the elderly. Cover your mouth when you cough or sneeze. Always remember to wash your hands.  Get Help Right Away If: You develop worsening fever or sinus pain. You develop a severe head ache or visual changes. Your symptoms persist after you have completed your treatment plan.  Make sure you Understand these instructions. Will watch your condition. Will get help right away if you are not doing well or get worse.   Thank  you for choosing an e-visit.  Your e-visit answers were reviewed by a board certified advanced clinical practitioner to complete your personal care plan. Depending upon the condition, your plan could have included both over the counter or prescription medications.  Please review your pharmacy choice. Make sure the pharmacy is open so you can pick up prescription now. If there is a problem, you may contact your provider through [employer] and have the prescription routed to another pharmacy.  Your safety is important to [place]. If you have drug allergies check your prescription carefully.   For the next 24 hours you can use MyChart to ask questions about today's visit, request a non-urgent call back, or ask for a work or school excuse. You will get an email in the next two days asking about your experience. I hope that your e-visit has been valuable and will speed your recovery.  I have spent 5 minutes in review of e-visit questionnaire, review and updating patient chart, medical decision making and response to patient.   Mar Daring, PA-C

## 2022-10-28 ENCOUNTER — Other Ambulatory Visit: Payer: Self-pay | Admitting: Family Medicine

## 2022-11-07 ENCOUNTER — Other Ambulatory Visit: Payer: Self-pay | Admitting: Psychiatry

## 2022-11-07 DIAGNOSIS — F5101 Primary insomnia: Secondary | ICD-10-CM

## 2022-11-24 ENCOUNTER — Other Ambulatory Visit: Payer: Self-pay | Admitting: Family Medicine

## 2022-12-11 ENCOUNTER — Ambulatory Visit: Payer: Medicare Other | Admitting: Adult Health

## 2022-12-13 ENCOUNTER — Encounter: Payer: Self-pay | Admitting: Obstetrics and Gynecology

## 2022-12-13 ENCOUNTER — Ambulatory Visit: Payer: Medicare Other | Admitting: Obstetrics and Gynecology

## 2022-12-13 ENCOUNTER — Other Ambulatory Visit (HOSPITAL_COMMUNITY)
Admission: RE | Admit: 2022-12-13 | Discharge: 2022-12-13 | Disposition: A | Discharge status: Home / Self Care | Payer: Medicare Other | Source: Ambulatory Visit | Attending: Obstetrics and Gynecology | Admitting: Obstetrics and Gynecology

## 2022-12-13 VITALS — BP 128/74 | HR 69 | Wt 161.0 lb

## 2022-12-13 DIAGNOSIS — N8501 Benign endometrial hyperplasia: Secondary | ICD-10-CM

## 2022-12-13 NOTE — Progress Notes (Signed)
GYNECOLOGY  VISIT   HPI: 72 y.o.   Widowed White or Caucasian Not Hispanic or Latino  female   [id number] with Patient's last menstrual period was 08/29/2002 (approximate).   here for endo biopsy.  H/O Hysteroscopy, D&C in 8/23 for PMP bleeding.    A. ENDOMETRIUM, CURETTAGE: Fragments atrophic squamous epithelium and minute fragments of normal endocervical tissue. Small fragments of endometrium with features suggestive of a benign endometrial polyp. Negative for hyperplasia and malignancy.  B. ENDOMETRIUM, POLYPECTOMY: Fragments of benign endometrial polyp with foci of simple hyperplasia without atypia. Portions of myometrium suspicious of submucosal uterine leiomyoma. Negative for malignancy.   Her cavity was too small for a mirena IUD, so she was started on daily provera.   GYNECOLOGIC HISTORY: Patient's last menstrual period was 08/29/2002 (approximate). Contraception:pmp Menopausal hormone therapy: none         OB History     Gravida  1   Para  0   Term  0   Preterm  0   AB  1   Living  0      SAB  0   IAB  1   Ectopic  0   Multiple  0   Live Births  0              Patient Active Problem List   Diagnosis Date Noted   OSA (obstructive sleep apnea) 01/04/2021   Perennial non-allergic rhinitis 01/04/2021   Prediabetes    Major depressive disorder, recurrent episode, mild ([place]) 08/24/2018   Major depressive disorder, recurrent episode, in full remission ([place]) 07/14/2018   Pain in joint of right elbow 12/04/2017   Osteopenia 02/11/2014   Benign paroxysmal positional vertigo 02/11/2014   Melanoma ([place]) 03/31/2012   History of depression 03/31/2012   Essential hypertension 03/31/2012    Past Medical History:  Diagnosis Date   Arthritis    Cancer ([place])    right leg, upper thigh AND CALF melanoma   Depression    Family history of adverse reaction to anesthesia    older sisters x 2 with ponv   GERD (gastroesophageal reflux disease)     Hypertension    Patient denies   Melanoma ([place])    Obstructive sleep apnea    uses cpap some nights set on 17 per cpap titration report 04-18-2020 epic   Osteopenia    PMB (postmenopausal bleeding)    Pre-diabetes    UTI (lower urinary tract infection)    Wears glasses     Past Surgical History:  Procedure Laterality Date   BREAST BIOPSY Left    several yrs ago   COLONOSCOPY  2020   [place] N/A 06/05/2022   Procedure: [place];  Surgeon: Salvadore Dom, MD;  Location: [facility];  Service: Gynecology;  Laterality: N/A;   MELANOMA EXCISION  2006   right thigh and calf   pre melamona area removed  10/2021   REFRACTIVE SURGERY     squamous cell area removed  11/2021   upper left leg   TONSILLECTOMY     as child   WISDOM TOOTH EXTRACTION     many yrs ago    Current Outpatient Medications  Medication Sig Dispense Refill   amLODipine (NORVASC) 2.5 MG tablet Take 1 tablet (2.5 mg total) by mouth daily. 90 tablet 3   aspirin EC 81 MG tablet Take 1 tablet (81 mg total) by mouth daily. Swallow whole. 90 tablet 3  buPROPion (WELLBUTRIN XL) 150 MG 24 hr tablet Take 1 tablet (150 mg total) by mouth daily. 90 tablet 1   Co-Enzyme Q10 100 MG CAPS Take 1 capsule by mouth daily.     DULoxetine (CYMBALTA) 30 MG capsule TAKE 1 CAPSULES BY MOUTH DAILY WITH 60MG CAPSULE TO EQUAL TOTAL DAILY DOSE OF 90MG 90 capsule 1   DULoxetine (CYMBALTA) 60 MG capsule TAKE 1 CAPSULE BY MOUTH DAILY WITH 30MG CAPSULE TO EQUAL TOTAL DAILY DOSE OF 90MG 90 capsule 1   losartan-hydrochlorothiazide (HYZAAR) 100-12.5 MG tablet TAKE 1 TABLET BY MOUTH EVERY DAY *SCHEDULE APPT FOR FUTURE REFILLS* 90 tablet 0   medroxyPROGESTERone (PROVERA) 10 MG tablet Take 1 tablet (10 mg total) by mouth daily. 90 tablet 3   Multiple Vitamin (MULTIVITAMIN) capsule Take 1 capsule by mouth daily.     Omega-3 Fatty Acids (FISH OIL PO) Take  by mouth daily.     rosuvastatin (CRESTOR) 5 MG tablet Take 1 tablet (5 mg total) by mouth every other day. 45 tablet 3   Suvorexant (BELSOMRA) 15 MG TABS Take 15 mg by mouth at bedtime. 30 tablet 5   No current facility-administered medications for this visit.     ALLERGIES: Keflex [cephalexin] and Sulfa antibiotics  Family History  Problem Relation Age of Onset   Cancer Mother        breast   Stroke Mother    Breast cancer Mother    Depression Mother    Alcohol abuse Father    Heart attack Father    Other Sister 37       colon mass, benign   Hypertension Sister    Colon cancer Sister 69   Depression Sister    Colon polyps Sister    Prostate cancer Brother    Other Sister 30       colon mass- not colon cancer   Hypertension Sister    Colon polyps Sister    Anxiety disorder Maternal Aunt    Esophageal cancer Neg Hx    Rectal cancer Neg Hx    Stomach cancer Neg Hx     Social History   Socioeconomic History   Marital status: Widowed    Spouse name: Not on file   Number of children: 0   Years of education: Not on file   Highest education level: Bachelor's degree (e.g., BA, AB, BS)  Occupational History   Occupation: Retired Education officer, museum and [place] Use   Smoking status: Never   Smokeless tobacco: Never  Vaping Use   Vaping Use: Never used  Substance and Sexual Activity   Alcohol use: Yes    Alcohol/week: 1.0 - 2.0 standard drink of alcohol    Types: 1 - 2 Glasses of wine per week    Comment: 1 to  3 glasses wine per week   Drug use: No   Sexual activity: Not Currently    Birth control/protection: Post-menopausal  Other Topics Concern   Not on file  Social History Narrative   Not on file   Social Determinants of Health   Financial Resource Strain: Low Risk  (07/17/2022)   Overall Financial Resource Strain (CARDIA)    Difficulty of Paying Living Expenses: Not hard at all  Food Insecurity: No Food Insecurity (07/17/2022)   Hunger  Vital Sign    Worried About Running Out of Food in the Last Year: Never true    Ran Out of Food in the Last Year: Never true  Transportation Needs:  No Transportation Needs (07/17/2022)   PRAPARE - Hydrologist (Medical): No    Lack of Transportation (Non-Medical): No  Physical Activity: Sufficiently Active (07/17/2022)   Exercise Vital Sign    Days of Exercise per Week: 4 days    Minutes of Exercise per Session: 60 min  Stress: No Stress Concern Present (07/17/2022)   Reading    Feeling of Stress : Not at all  Social Connections: Moderately Integrated (04/09/2022)   Social Connection and Isolation Panel [NHANES]    Frequency of Communication with Friends and Family: More than three times a week    Frequency of Social Gatherings with Friends and Family: Three times a week    Attends Religious Services: More than 4 times per year    Active Member of Clubs or Organizations: Yes    Attends Archivist Meetings: More than 4 times per year    Marital Status: Widowed  Intimate Partner Violence: Not At Risk (07/04/2021)   Humiliation, Afraid, Rape, and Kick questionnaire    Fear of Current or Ex-Partner: No    Emotionally Abused: No    Physically Abused: No    Sexually Abused: No    ROS  PHYSICAL EXAMINATION:    BP 128/74   Pulse 69   Wt 161 lb (73 kg)   LMP 08/29/2002 (Approximate)   SpO2 100%   BMI 25.99 kg/m     General appearance: alert, cooperative and appears stated age  Pelvic: External genitalia:  no lesions              Urethra:  normal appearing urethra with no masses, tenderness or lesions              Bartholins and Skenes: normal                 Vagina: normal appearing vagina with normal color and discharge, no lesions              Cervix: no lesions and stenotic               The risks of endometrial biopsy were reviewed and a consent was obtained.  A speculum  was placed in the vagina and the cervix was cleansed with betadine. A tenaculum was placed on the cervix and the cervix was dilated with the os finder. The pipelle was placed into the endometrial cavity. The uterus sounded to 5+ cm. The endometrial biopsy was performed, taking care to get a representative sample, sampling 360 degrees of the uterine cavity. Minimal tissue was obtained. The tenaculum and speculum were removed. There were no complications.    Chaperone was present for exam.  1. Simple endometrial hyperplasia without atypia Continue on provera - Surgical pathology( [place])

## 2022-12-13 NOTE — Patient Instructions (Signed)
Dr Edwinna Areola  Endometrial Biopsy Post-procedure Instructions Cramping is common.  You may take Ibuprofen, Aleve, or Tylenol for the cramping.  This should resolve within 24 hours.   You may have a small amount of spotting.  You should wear a mini pad for the next few days. You may have intercourse in 24 hours. You need to call the office if you have any pelvic pain, fever, heavy bleeding, or foul smelling vaginal discharge. Shower or bathe as normal You will be notified within one week of your biopsy results or we will discuss your results at your follow-up appointment if needed.

## 2022-12-20 LAB — SURGICAL PATHOLOGY

## 2023-01-04 IMAGING — CT CT CARDIAC CORONARY ARTERY CALCIUM SCORE
3 series · 14 of 20 positions shown, 16 images · non-contrast
Comparison: None.
COMPARISON: None.

Addendum:
EXAM:
OVER-READ INTERPRETATION  CT CHEST

The following report is an over-read performed by radiologist Dr.
Yulianty Lound [REDACTED] on 01/26/2022. This
over-read does not include interpretation of cardiac or coronary
anatomy or pathology. The coronary calcium score interpretation by
the cardiologist is attached.
CLINICAL DATA: Cardiovascular disease risk stratification
cardiac risk counseling
CT Coronary Calcium Score
TECHNIQUE: A gated, non-contrast computed tomography scan of the heart was
performed using 3mm slice thickness. Axial images were analyzed on a
dedicated workstation. Calcium scoring of the coronary arteries was
performed using the Agatston method.

[Series 2: cascseq 2.0 sa36 70% (id) · axial · 0.39mm/px · z∈[-172,-102]mm · 4 of 59 slices shown]
[im 12/59  vessel]
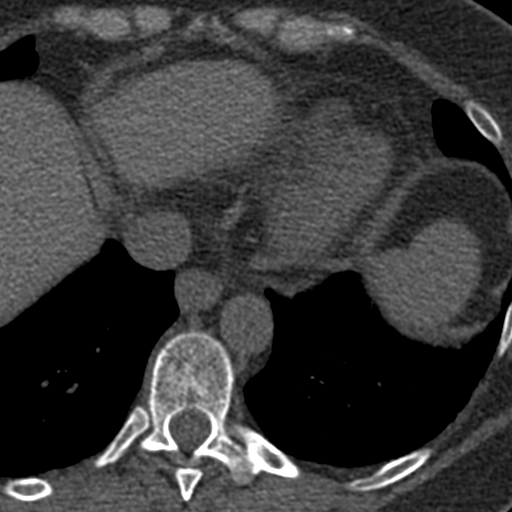
[im 24/59  vessel]
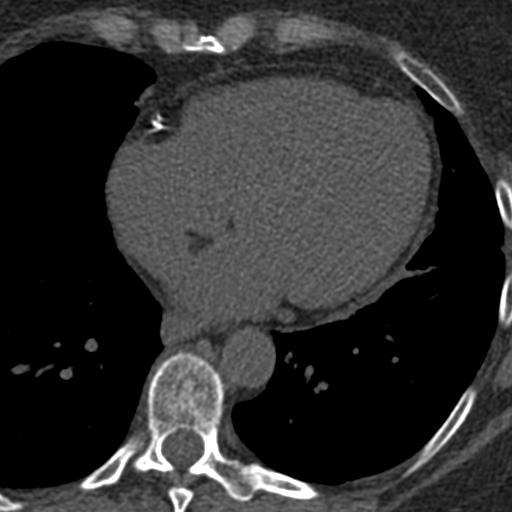
[im 35/59  vessel]
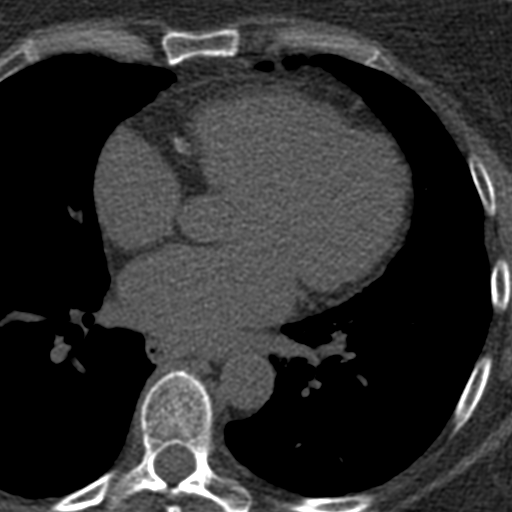
[im 47/59  vessel]
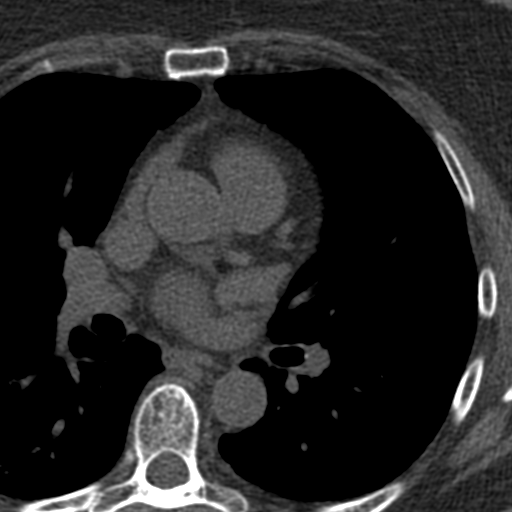

[Series 3: cascseq 2.0 bf37 st · axial · 0.63mm/px · z∈[-176,-98]mm · 5 of 59 slices shown, 7 images]
[im 10/59  vessel]
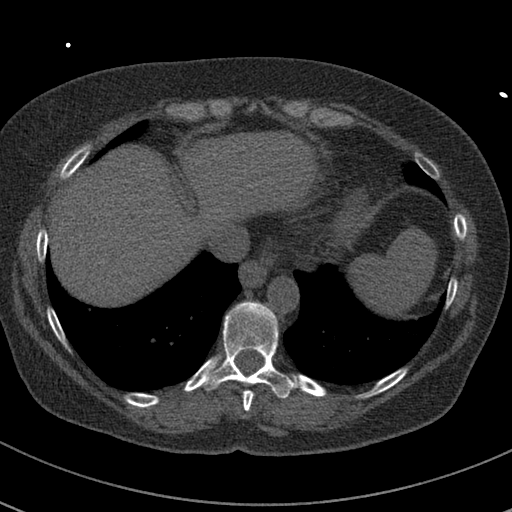
[im 10/59  lung]
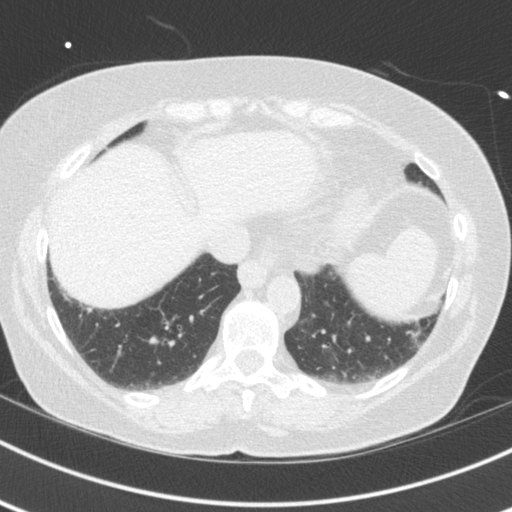
[im 20/59  vessel]
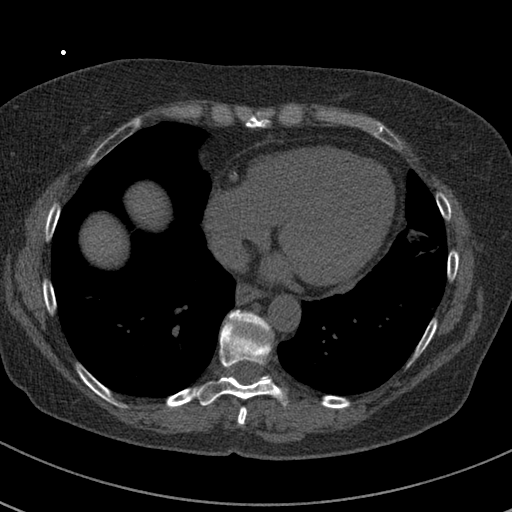
[im 30/59  vessel]
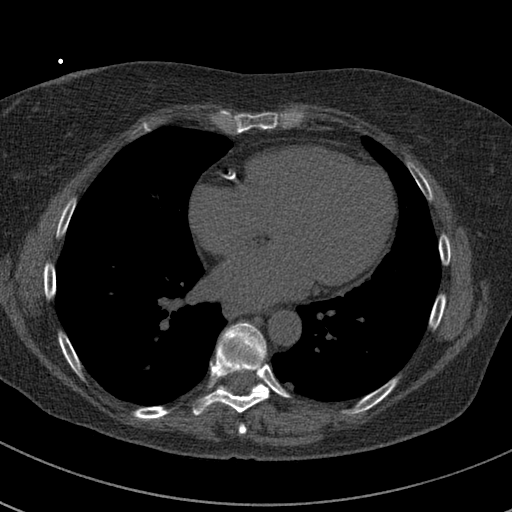
[im 39/59  vessel]
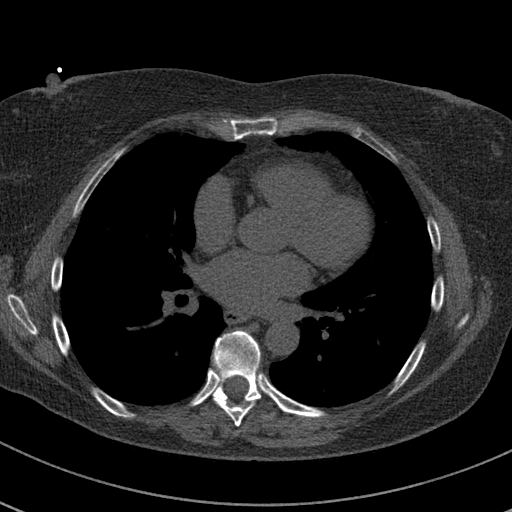
[im 49/59  vessel]
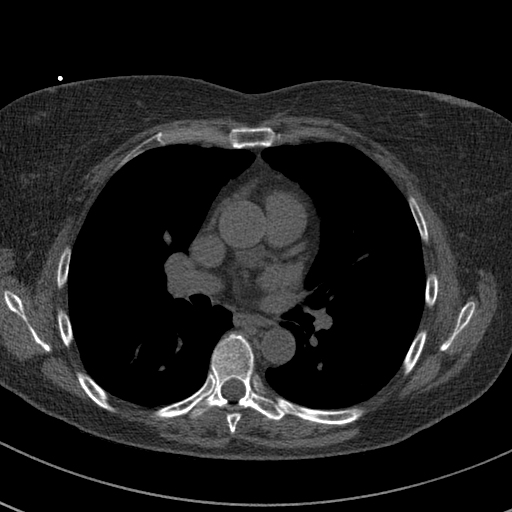
[im 49/59  lung]
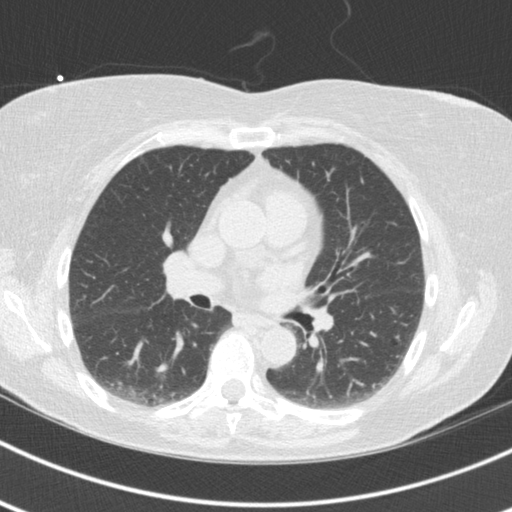

[Series 4: cascseq 2.0 br59 lung · axial · 0.63mm/px · z∈[-176,-98]mm · 5 of 59 slices shown]
[im 10/59  lung]
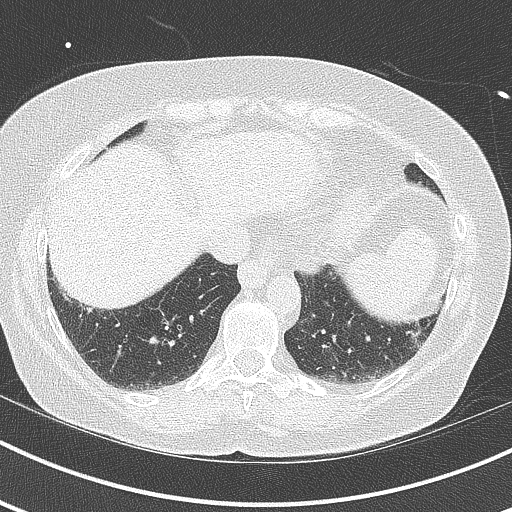
[im 20/59  lung]
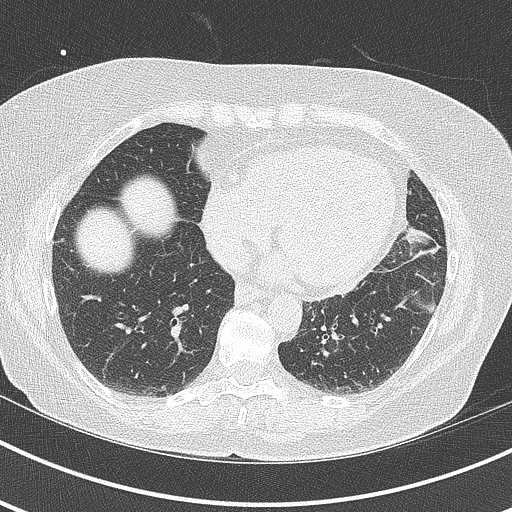
[im 30/59  lung]
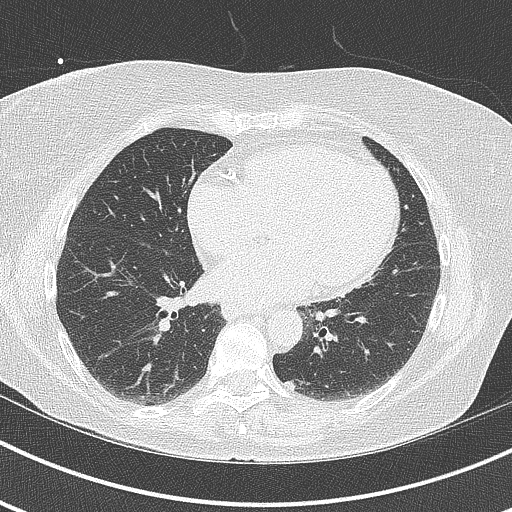
[im 39/59  lung]
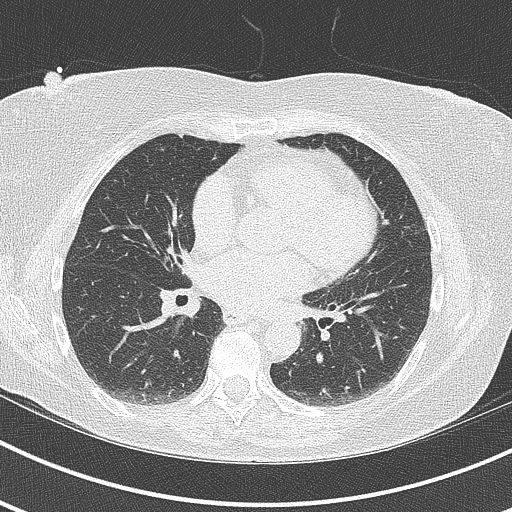
[im 49/59  lung]
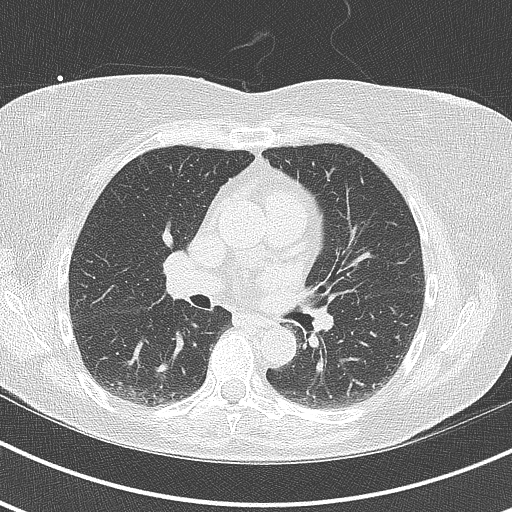

[14 of 20 positions shown; findings below may reference images not displayed]

FINDINGS: Limited view of the lung parenchyma demonstrates small 4 mm nodule
along the fissure in the LEFT lower lobe (image [DATE]). Airways are
normal.

Limited view of the mediastinum demonstrates no adenopathy.
Esophagus normal.

Limited view of the upper abdomen demonstrate small hiatal hernia.

Limited view of the skeleton and chest wall is unremarkable.
IMPRESSION: 1. Small hiatal hernia.
2. 4 mm left solid pulmonary nodule. No routine follow-up imaging is
recommended per [HOSPITAL] Guidelines.
These guidelines do not apply to immunocompromised patients and
patients with cancer. Follow up in patients with significant
comorbidities as clinically warranted. For lung cancer screening,
adhere to Lung-RADS guidelines. Reference: Radiology. 1393;
FINDINGS: Coronary Calcium Score:

Left main: 0

Left anterior descending artery:

Left circumflex artery: 0

Right coronary artery: 826

Total: 850

Percentile: 95th

Pericardium: Normal.  Trivial posterior pericardial effusion.

Ascending Aorta: Normal caliber. Ascending aorta measures
approximately 30mm at the mid ascending aorta measured in an axial
plane.

Non-cardiac: See separate report from [REDACTED].
IMPRESSION: Coronary calcium score of 850. This was 95th percentile for age-,
race-, and sex-matched controls.



If CAC=0, it is reasonable to withhold statin therapy and reassess
in 5 to 10 years, as long as higher risk conditions are absent
(diabetes mellitus, family history of premature CHD in first degree
relatives (males <55 years; females <65 years), cigarette smoking,
or LDL >=190 mg/dL).

If CAC is 1 to 99, it is reasonable to initiate statin therapy for
patients >=55 years of age.

If CAC is >=100 or >=75th percentile, it is reasonable to initiate
statin therapy at any age.

Cardiology referral should be considered for patients with CAC
scores >=400 or >=75th percentile.

*6798 AHA/ACC/AACVPR/AAPA/ABC/SADASIVEN/BRACK TIGER/TIGER/[place]/DATABEX/MOISES/ANSHUMAN
Guideline on the Management of Blood Cholesterol: A Report of the
American College of Cardiology/American Heart Association Task Force
on Clinical Practice Guidelines. J Am Coll Cardiol.
2466;73(24):8995-8073.

*** End of Addendum ***
EXAM:
OVER-READ INTERPRETATION  CT CHEST

The following report is an over-read performed by radiologist Dr.
Yulianty Lound [REDACTED] on 01/26/2022. This
over-read does not include interpretation of cardiac or coronary
anatomy or pathology. The coronary calcium score interpretation by
the cardiologist is attached.
FINDINGS: Limited view of the lung parenchyma demonstrates small 4 mm nodule
along the fissure in the LEFT lower lobe (image [DATE]). Airways are
normal.

Limited view of the mediastinum demonstrates no adenopathy.
Esophagus normal.

Limited view of the upper abdomen demonstrate small hiatal hernia.

Limited view of the skeleton and chest wall is unremarkable.
IMPRESSION: 1. Small hiatal hernia.
2. 4 mm left solid pulmonary nodule. No routine follow-up imaging is
recommended per [HOSPITAL] Guidelines.
These guidelines do not apply to immunocompromised patients and
patients with cancer. Follow up in patients with significant
comorbidities as clinically warranted. For lung cancer screening,
adhere to Lung-RADS guidelines. Reference: Radiology. 1393;

## 2023-02-26 ENCOUNTER — Other Ambulatory Visit: Payer: Self-pay | Admitting: Cardiovascular Disease

## 2023-02-26 ENCOUNTER — Other Ambulatory Visit: Payer: Self-pay | Admitting: Family Medicine

## 2023-03-08 ENCOUNTER — Ambulatory Visit: Payer: Medicare Other | Admitting: Psychiatry

## 2023-03-08 ENCOUNTER — Encounter: Payer: Self-pay | Admitting: Psychiatry

## 2023-03-08 DIAGNOSIS — F3342 Major depressive disorder, recurrent, in full remission: Secondary | ICD-10-CM

## 2023-03-08 DIAGNOSIS — F5101 Primary insomnia: Secondary | ICD-10-CM

## 2023-03-08 MED ORDER — DULOXETINE HCL 30 MG PO CPEP: ORAL_CAPSULE | ORAL | 1 refills | Status: DC

## 2023-03-08 MED ORDER — BUPROPION HCL ER (XL) 150 MG PO TB24: 150.0000 mg | ORAL_TABLET | Freq: Every day | ORAL | 1 refills | Status: DC

## 2023-03-08 MED ORDER — DULOXETINE HCL 60 MG PO CPEP
ORAL_CAPSULE | ORAL | 1 refills | Status: DC
Start: 2023-03-08 — End: 2023-09-09

## 2023-03-08 NOTE — Progress Notes (Unsigned)
Lori Harvey [id number] February 11, 1951 72 y.o.  Subjective:   Patient ID:  Lori Harvey is a 72 y.o. (DOB 1951-07-22) female.  Chief Complaint:  Chief Complaint  Patient presents with   Insomnia   Follow-up    Depression, anxiety    Insomnia   [name] presents to the office today for follow-up of insomnia, depression, and anxiety. She reports that she would like to continue current medications without changes and does not want to reduce medications. She reports that her anxiety has been manageable. Denies depressed mood. Energy and motivation have been ok. She reports that she will periodically procrastinate, and that she has been able to push herself to do things she needs to do. She reports that she will periodically forget what she was about to do. She notices changes in her memory. "It bothers me when I know I have something to do and haven't done it." Appetite has been good. She reports eating less with Invisalign. Denies SI.   She reports that she took Belsomra and stopped it while she had multiple things going on. She reports that she has been taking Trazodone most nights recently. She reports that one night she was awake until about 4 am after taking Trazodone. She continues to have some difficulty with sleep occasionally. She reports that she has not noticed anxious thoughts recently when unable to sleep.   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 She has a fully furnished apartment in [place]. She reports a family stressor with husband's children. She is selling a lake house.    Past Psychiatric Medication Trials: Sertraline Lexapro Celexa Cymbalta Wellbutrin Ambien Valium Trazodone Ambien  AUDIT    Flowsheet Row Office Visit from 04/13/2022 in [facility] [place]  Alcohol Use Disorder Identification Test Final Score (AUDIT) 3      PHQ2-9    Flowsheet Row Clinical Support from 07/17/2022 in [facility]  [place] Clinical Support from 07/04/2021 in [facility] [place] Office Visit from 03/21/2020 in [facility] [place] Office Visit from 02/17/2019 in [facility] [place] Office Visit from 12/24/2014 in [facility] [place]  PHQ-2 Total Score 0 0 0 0 0      Flowsheet Row Admission (Discharged) from 06/05/2022 in [facility]  C-SSRS RISK CATEGORY No Risk        Review of Systems:  Review of Systems  HENT:  Positive for dental problem.   Musculoskeletal:  Negative for gait problem.  Neurological:  Negative for tremors.  Psychiatric/Behavioral:  The patient has insomnia.        Please refer to HPI    Medications: I have reviewed the patient's current medications.  Current Outpatient Medications  Medication Sig Dispense Refill   traZODone (DESYREL) 50 MG tablet Take 50 mg by mouth at bedtime as needed for sleep.     amLODipine (NORVASC) 2.5 MG tablet Take 1 tablet (2.5 mg total) by mouth daily. 90 tablet 3   aspirin EC 81 MG tablet Take 1 tablet (81 mg total) by mouth daily. Swallow whole. 90 tablet 3   buPROPion (WELLBUTRIN XL) 150 MG 24 hr tablet Take 1 tablet (150 mg total) by mouth daily. 90 tablet 1   Co-Enzyme Q10 100 MG CAPS Take 1 capsule by mouth daily.     DULoxetine (CYMBALTA) 30 MG capsule TAKE 1 CAPSULES BY MOUTH DAILY WITH 60MG  CAPSULE TO EQUAL TOTAL DAILY DOSE OF 90MG   90 capsule 1   DULoxetine (CYMBALTA) 60 MG capsule TAKE 1 CAPSULE BY MOUTH DAILY WITH 30MG  CAPSULE TO EQUAL TOTAL DAILY DOSE OF 90MG  90 capsule 1   losartan-hydrochlorothiazide (HYZAAR) 100-12.5 MG tablet TAKE 1 TABLET BY MOUTH EVERY DAY *SCHEDULE APPT FOR FUTURE REFILLS* 90 tablet 0   medroxyPROGESTERone (PROVERA) 10 MG tablet Take 1 tablet (10 mg total) by mouth daily. 90 tablet 3   Multiple Vitamin (MULTIVITAMIN) capsule Take 1 capsule by mouth daily.     Omega-3 Fatty Acids (FISH OIL PO) Take by mouth daily.      rosuvastatin (CRESTOR) 5 MG tablet Take 1 tablet (5 mg total) by mouth every other day. 45 tablet 3   Suvorexant (BELSOMRA) 15 MG TABS Take 15 mg by mouth at bedtime. 30 tablet 5   No current facility-administered medications for this visit.    Medication Side Effects: None  Allergies:  Allergies  Allergen Reactions   Keflex [Cephalexin]     Nausea, hives   Sulfa Antibiotics Rash    Past Medical History:  Diagnosis Date   Arthritis    Cancer ([facility])    right leg, upper thigh AND CALF melanoma   Depression    Family history of adverse reaction to anesthesia    older sisters x 2 with ponv   GERD (gastroesophageal reflux disease)    Hypertension    Patient denies   Melanoma ([facility])    Obstructive sleep apnea    uses cpap some nights set on 17 per cpap titration report 04-18-2020 epic   Osteopenia    PMB (postmenopausal bleeding)    Pre-diabetes    UTI (lower urinary tract infection)    Wears glasses     Past Medical History, Surgical history, Social history, and Family history were reviewed and updated as appropriate.   Please see review of systems for further details on the patient's review from today.   Objective:   Physical Exam:  LMP 08/29/2002 (Approximate)   Physical Exam Constitutional:      General: She is not in acute distress. Musculoskeletal:        General: No deformity.  Neurological:     Mental Status: She is alert and oriented to person, place, and time.     Coordination: Coordination normal.  Psychiatric:        Attention and Perception: Attention and perception normal. She does not perceive auditory or visual hallucinations.        Mood and Affect: Mood normal. Mood is not anxious or depressed. Affect is not labile, blunt, angry or inappropriate.        Speech: Speech normal.        Behavior: Behavior normal.        Thought Content: Thought content normal. Thought content is not paranoid or delusional. Thought content does not include homicidal or  suicidal ideation. Thought content does not include homicidal or suicidal plan.        Cognition and Memory: Cognition and memory normal.        Judgment: Judgment normal.     Comments: Insight intact     Lab Review:     Component Value Date/Time   NA 137 03/07/2021 1357   NA 139 09/08/2019 1045   K 4.1 03/07/2021 1357   CL 99 03/07/2021 1357   CO2 31 03/07/2021 1357   GLUCOSE 100 (H) 03/07/2021 1357   BUN 16 03/07/2021 1357   BUN 11 09/08/2019 1045   CREATININE 0.99 03/07/2021 1357  CREATININE 0.87 07/16/2016 1455   CALCIUM 9.3 03/07/2021 1357   PROT 6.8 03/07/2021 1357   PROT 6.6 09/08/2019 1045   ALBUMIN 4.3 03/07/2021 1357   ALBUMIN 4.2 09/08/2019 1045   AST 19 03/07/2021 1357   ALT 17 03/07/2021 1357   ALKPHOS 70 03/07/2021 1357   BILITOT 0.6 03/07/2021 1357   BILITOT 0.4 09/08/2019 1045   GFRNONAA 62 09/08/2019 1045   GFRAA 72 09/08/2019 1045       Component Value Date/Time   WBC 7.3 03/07/2021 1357   RBC 4.94 03/07/2021 1357   HGB 13.7 03/07/2021 1357   HGB 13.7 09/08/2019 1045   HGB 13.7 02/11/2014 1304   HCT 41.5 03/07/2021 1357   HCT 41.9 09/08/2019 1045   PLT 299.0 03/07/2021 1357   PLT 340 09/08/2019 1045   MCV 83.9 03/07/2021 1357   MCV 85 09/08/2019 1045   MCH 27.7 09/08/2019 1045   MCH 28.1 07/16/2016 1455   MCHC 33.1 03/07/2021 1357   RDW 13.9 03/07/2021 1357   RDW 13.1 09/08/2019 1045   LYMPHSABS 2.5 03/07/2021 1357   MONOABS 0.4 03/07/2021 1357   EOSABS 0.1 03/07/2021 1357   BASOSABS 0.0 03/07/2021 1357    No results found for: "POCLITH", "LITHIUM"   No results found for: "PHENYTOIN", "PHENOBARB", "VALPROATE", "CBMZ"   .res Assessment: Plan:    She reports that she would like to re-start trial of Belsomra since she recalls trial of Belsomra possibly being helpful, however she has not taken Belsomra recently since she has been busy with multiple responsibilities and was concerned about possibly over-sleeping. She reports that some of  the tasks she has been working on are now completed and she would like to re-start Belsomra. She is unsure if 15 mg dose was partially or fully effective. Pt provided with samples of Belsomra 15 mg and 20 mg to re-trial to determine which dose is effective and well-tolerated. Discussed that there is a script on file at her pharmacy for Belsomra 15 mg po QHS and that she may contact office to request  script for 20 mg if this dose proves to be more effective.  Will continue Duloxetine 90 mg po qd for anxiety and depression.  Continue Wellbutrin XL 150 mg po qd for depression.  Continue Trazodone 50 mg po QHS prn. Pt to follow-up in 6 months or sooner if clinically indicated.  Patient advised to contact office with any questions, adverse effects, or acute worsening in signs and symptoms.  I spent 40 minutes dedicated to the care of this patient on the date of this  encounter to include pre-visit review of records, face-to-face time with the patient discussing treatment options for insomnia, ordering of medication, and post visit documentation.   Janki was seen today for insomnia and follow-up.  Diagnoses and all orders for this visit:  Major depressive disorder, recurrent episode, in full remission ([facility]) -     DULoxetine (CYMBALTA) 60 MG capsule; TAKE 1 CAPSULE BY MOUTH DAILY WITH 30MG  CAPSULE TO EQUAL TOTAL DAILY DOSE OF 90MG  -     DULoxetine (CYMBALTA) 30 MG capsule; TAKE 1 CAPSULES BY MOUTH DAILY WITH 60MG  CAPSULE TO EQUAL TOTAL DAILY DOSE OF 90MG  -     buPROPion (WELLBUTRIN XL) 150 MG 24 hr tablet; Take 1 tablet (150 mg total) by mouth daily.  Primary insomnia     Please see After Visit Summary for patient specific instructions.  Future Appointments  Date Time Provider Department Center  07/22/2023 11:00 AM [facility]-NURSE  HEALTH ADVISOR 2 [facility]  09/09/2023  3:00 PM Corie Chiquito, PMHNP [facility] None    No orders of the defined types were placed in this  encounter.   -------------------------------

## 2023-03-10 MED ORDER — BELSOMRA 15 MG PO TABS: 15.0000 mg | ORAL_TABLET | Freq: Every day | ORAL | 0 refills | Status: DC

## 2023-03-10 MED ORDER — BELSOMRA 20 MG PO TABS: 20.0000 mg | ORAL_TABLET | Freq: Every day | ORAL | 0 refills | Status: DC

## 2023-03-30 ENCOUNTER — Other Ambulatory Visit: Payer: Self-pay | Admitting: Family Medicine

## 2023-04-10 ENCOUNTER — Telehealth: Payer: Self-pay | Admitting: *Deleted

## 2023-04-10 ENCOUNTER — Other Ambulatory Visit: Payer: Self-pay | Admitting: *Deleted

## 2023-04-10 DIAGNOSIS — N8501 Benign endometrial hyperplasia: Secondary | ICD-10-CM

## 2023-04-10 NOTE — Telephone Encounter (Signed)
Call returned to patient.  Patient is requesting to schedule EMB in 05/2023, patient of Dr. Oscar La.   EMB scheduled with Dr. Edward Jolly on 06/20/23 at 1130. Advised to take Motrin 800 mg with food and water one hour before procedure.  New order placed.   Patient asking if anything can be given for pain or comfort? States she did not tolerate last EMB well. Advised RX for anxiety may be prescribed, if appropriate, to take 1 hour prior to procedure. Advised will need a driver day of procedure and come into office prior to day of procedure to sign procedure consent. This would need to be reviewed by provider in advance. Patient states she will think about this and return call if desired. Advised I will update both providers and return call if any additional recommendations. Patient appreciative of call.   Routing to provider for final review. Patient is agreeable to disposition. Will close encounter.  Cc: Dr. Edward Jolly

## 2023-04-19 DIAGNOSIS — Z85820 Personal history of malignant melanoma of skin: Secondary | ICD-10-CM | POA: Diagnosis not present

## 2023-04-19 DIAGNOSIS — Z85828 Personal history of other malignant neoplasm of skin: Secondary | ICD-10-CM | POA: Diagnosis not present

## 2023-04-19 DIAGNOSIS — L814 Other melanin hyperpigmentation: Secondary | ICD-10-CM | POA: Diagnosis not present

## 2023-04-19 DIAGNOSIS — D2272 Melanocytic nevi of left lower limb, including hip: Secondary | ICD-10-CM | POA: Diagnosis not present

## 2023-04-19 DIAGNOSIS — D2271 Melanocytic nevi of right lower limb, including hip: Secondary | ICD-10-CM | POA: Diagnosis not present

## 2023-04-19 DIAGNOSIS — L578 Other skin changes due to chronic exposure to nonionizing radiation: Secondary | ICD-10-CM | POA: Diagnosis not present

## 2023-04-19 DIAGNOSIS — Z86018 Personal history of other benign neoplasm: Secondary | ICD-10-CM | POA: Diagnosis not present

## 2023-05-13 ENCOUNTER — Other Ambulatory Visit: Payer: Self-pay | Admitting: Family Medicine

## 2023-05-13 DIAGNOSIS — Z1231 Encounter for screening mammogram for malignant neoplasm of breast: Secondary | ICD-10-CM

## 2023-05-14 DIAGNOSIS — H2513 Age-related nuclear cataract, bilateral: Secondary | ICD-10-CM | POA: Diagnosis not present

## 2023-05-14 DIAGNOSIS — H04123 Dry eye syndrome of bilateral lacrimal glands: Secondary | ICD-10-CM | POA: Diagnosis not present

## 2023-05-21 ENCOUNTER — Ambulatory Visit
Admission: RE | Admit: 2023-05-21 | Discharge: 2023-05-21 | Disposition: A | Discharge status: Home / Self Care | Payer: Medicare Other | Source: Ambulatory Visit

## 2023-05-21 ENCOUNTER — Telehealth: Payer: Self-pay | Admitting: Family Medicine

## 2023-05-21 DIAGNOSIS — Z1231 Encounter for screening mammogram for malignant neoplasm of breast: Secondary | ICD-10-CM | POA: Diagnosis not present

## 2023-05-21 DIAGNOSIS — Z0000 Encounter for general adult medical examination without abnormal findings: Secondary | ICD-10-CM

## 2023-05-21 NOTE — Telephone Encounter (Signed)
Pt has been scheduled for a CPE on 06/17/23.  Pt stated she called her insurance and confirmed that "As long as the labs are classified as labs for CPE"   it would be okay for her to have her labs done the week prior (06/10/23)  Pt is asking that MD please put in the order for Pt to have her labs done the week prior to her CPE.  Please advise.

## 2023-05-21 NOTE — Telephone Encounter (Signed)
Patient informed and labs placed 

## 2023-05-24 ENCOUNTER — Other Ambulatory Visit: Payer: Self-pay | Admitting: Family Medicine

## 2023-05-29 ENCOUNTER — Encounter (INDEPENDENT_AMBULATORY_CARE_PROVIDER_SITE_OTHER): Payer: Self-pay

## 2023-06-06 NOTE — Progress Notes (Signed)
GYNECOLOGY  VISIT   HPI: 72 y.o.   Widowed  Caucasian  female   [id number] with Patient's last menstrual period was 08/29/2002 (approximate).   here for   endo bx. Pt is still having some dark discharge, starting over the last couple of weeks.  She is not certain if it is blood.  This is how her symptoms presented last year.   Her pelvic ultrasound showed thickening of her endometrium to 5.9 mm with a 7 mm intracavitary mass.   Simple endometrial hyperplasia and benign polyp noted at time of post hysteroscopy, polypectomy 06/05/22.  She was started on provera 10 mg daily for treatment.   FU EMB on 12/13/22 - scant fragments benign endometrium.  Negative for hyperplasia or malignancy.   Took Xanax for visit today.  GYNECOLOGIC HISTORY: Patient's last menstrual period was 08/29/2002 (approximate). Contraception:  PMP Menopausal hormone therapy:  provera Last mammogram:  05/21/23 Breast Density Cat B, BI-RADS CAT 1 neg Last pap smear:   07/18/17 neg        OB History     Gravida  1   Para  0   Term  0   Preterm  0   AB  1   Living  0      SAB  0   IAB  1   Ectopic  0   Multiple  0   Live Births  0              Patient Active Problem List   Diagnosis Date Noted   OSA (obstructive sleep apnea) 01/04/2021   Perennial non-allergic rhinitis 01/04/2021   Prediabetes    Major depressive disorder, recurrent episode, mild (HCC) 08/24/2018   Major depressive disorder, recurrent episode, in full remission (HCC) 07/14/2018   Pain in joint of right elbow 12/04/2017   Osteopenia 02/11/2014   Benign paroxysmal positional vertigo 02/11/2014   Melanoma (HCC) 03/31/2012   History of depression 03/31/2012   Essential hypertension 03/31/2012    Past Medical History:  Diagnosis Date   Arthritis    Cancer (HCC)    right leg, upper thigh AND CALF melanoma   Depression    Family history of adverse reaction to anesthesia    older sisters x 2 with ponv   GERD  (gastroesophageal reflux disease)    Hypertension    Patient denies   Melanoma (HCC)    Obstructive sleep apnea    uses cpap some nights set on 17 per cpap titration report 04-18-2020 epic   Osteopenia    PMB (postmenopausal bleeding)    Pre-diabetes    UTI (lower urinary tract infection)    Wears glasses     Past Surgical History:  Procedure Laterality Date   BREAST BIOPSY Left    several yrs ago   COLONOSCOPY  2020   DILATATION & CURETTAGE/HYSTEROSCOPY WITH MYOSURE N/A 06/05/2022   Procedure: DILATATION & CURETTAGE/HYSTEROSCOPY WITH MYOSURE;  Surgeon: Romualdo Bolk, MD;  Location: [facility];  Service: Gynecology;  Laterality: N/A;   MELANOMA EXCISION  2006   right thigh and calf   pre melamona area removed  10/2021   REFRACTIVE SURGERY     squamous cell area removed  11/2021   upper left leg   TONSILLECTOMY     as child   WISDOM TOOTH EXTRACTION     many yrs ago    Current Outpatient Medications  Medication Sig Dispense Refill   ALPRAZolam (XANAX) 0.25 MG tablet Take one tablet (  0.25 mg) my mouth one hour prior to procedure. 2 tablet 0   amLODipine (NORVASC) 2.5 MG tablet TAKE 1 TABLET BY MOUTH EVERY DAY 90 tablet 0   aspirin EC 81 MG tablet Take 1 tablet (81 mg total) by mouth daily. Swallow whole. 90 tablet 3   buPROPion (WELLBUTRIN XL) 150 MG 24 hr tablet Take 1 tablet (150 mg total) by mouth daily. 90 tablet 1   Co-Enzyme Q10 100 MG CAPS Take 1 capsule by mouth daily.     DULoxetine (CYMBALTA) 30 MG capsule TAKE 1 CAPSULES BY MOUTH DAILY WITH 60MG  CAPSULE TO EQUAL TOTAL DAILY DOSE OF 90MG  90 capsule 1   DULoxetine (CYMBALTA) 60 MG capsule TAKE 1 CAPSULE BY MOUTH DAILY WITH 30MG  CAPSULE TO EQUAL TOTAL DAILY DOSE OF 90MG  90 capsule 1   Ginkgo Biloba 40 MG TABS      losartan-hydrochlorothiazide (HYZAAR) 100-12.5 MG tablet TAKE 1 TABLET BY MOUTH EVERY DAY *SCHEDULE APPT FOR FUTURE REFILLS* 90 tablet 1   medroxyPROGESTERone (PROVERA) 10 MG tablet Take  1 tablet (10 mg total) by mouth daily. 90 tablet 3   Multiple Vitamin (MULTIVITAMIN) capsule Take 1 capsule by mouth daily.     Omega-3 Fatty Acids (FISH OIL PO) Take by mouth daily.     rosuvastatin (CRESTOR) 5 MG tablet Take 1 tablet (5 mg total) by mouth every other day. 45 tablet 3   Suvorexant (BELSOMRA) 15 MG TABS Take 1 tablet (15 mg total) by mouth at bedtime. 30 tablet 2   traZODone (DESYREL) 50 MG tablet Take 50 mg by mouth at bedtime as needed for sleep.     TYRVAYA 0.03 MG/ACT SOLN Place into both nostrils.     No current facility-administered medications for this visit.     ALLERGIES: Keflex [cephalexin] and Sulfa antibiotics  Family History  Problem Relation Age of Onset   Cancer Mother        breast   Stroke Mother    Breast cancer Mother    Depression Mother    Alcohol abuse Father    Heart attack Father    Other Sister 71       colon mass, benign   Hypertension Sister    Colon cancer Sister 67   Depression Sister    Colon polyps Sister    Prostate cancer Brother    Other Sister 37       colon mass- not colon cancer   Hypertension Sister    Colon polyps Sister    Anxiety disorder Maternal Aunt    Esophageal cancer Neg Hx    Rectal cancer Neg Hx    Stomach cancer Neg Hx     Social History   Socioeconomic History   Marital status: Widowed    Spouse name: Not on file   Number of children: 0   Years of education: Not on file   Highest education level: Bachelor's degree (e.g., BA, AB, BS)  Occupational History   Occupation: Retired Engineer, site and [employer]  Tobacco Use   Smoking status: Never   Smokeless tobacco: Never  Vaping Use   Vaping status: Never Used  Substance and Sexual Activity   Alcohol use: Yes    Alcohol/week: 1.0 - 2.0 standard drink of alcohol    Types: 1 - 2 Glasses of wine per week    Comment: 1 to  3 glasses wine per week   Drug use: No   Sexual activity: Not Currently    Birth control/protection:  Post-menopausal   Other Topics Concern   Not on file  Social History Narrative   Not on file   Social Determinants of Health   Financial Resource Strain: Low Risk  (07/17/2022)   Overall Financial Resource Strain (CARDIA)    Difficulty of Paying Living Expenses: Not hard at all  Food Insecurity: No Food Insecurity (07/17/2022)   Hunger Vital Sign    Worried About Running Out of Food in the Last Year: Never true    Ran Out of Food in the Last Year: Never true  Transportation Needs: No Transportation Needs (07/17/2022)   PRAPARE - Administrator, Civil Service (Medical): No    Lack of Transportation (Non-Medical): No  Physical Activity: Sufficiently Active (07/17/2022)   Exercise Vital Sign    Days of Exercise per Week: 4 days    Minutes of Exercise per Session: 60 min  Stress: No Stress Concern Present (07/17/2022)   [employer] of Occupational Health - Occupational Stress Questionnaire    Feeling of Stress : Not at all  Social Connections: Moderately Integrated (04/09/2022)   Social Connection and Isolation Panel [NHANES]    Frequency of Communication with Friends and Family: More than three times a week    Frequency of Social Gatherings with Friends and Family: Three times a week    Attends Religious Services: More than 4 times per year    Active Member of Clubs or Organizations: Yes    Attends Banker Meetings: More than 4 times per year    Marital Status: Widowed  Intimate Partner Violence: Not At Risk (07/04/2021)   Humiliation, Afraid, Rape, and Kick questionnaire    Fear of Current or Ex-Partner: No    Emotionally Abused: No    Physically Abused: No    Sexually Abused: No    Review of Systems  All other systems reviewed and are negative.   PHYSICAL EXAMINATION:    BP 134/82 (BP Location: Left Arm, Patient Position: Sitting, Cuff Size: Normal)   Pulse 81   Ht 5' 5.5" (1.664 m)   Wt 160 lb (72.6 kg)   LMP 08/29/2002 (Approximate)   SpO2 98%   BMI 26.22  kg/m     General appearance: alert, cooperative and appears stated age   Pelvic: External genitalia:  no lesions              Urethra:  normal appearing urethra with no masses, tenderness or lesions              Bartholins and Skenes: normal                 Vagina: normal appearing vagina with normal color and discharge, no lesions              Cervix: no lesions                Bimanual Exam:  Uterus:  normal size, contour, position, consistency, mobility, non-tender              Adnexa: no mass, fullness, tenderness   EMB Consent done.  Hibiclens prep.  Tenaculum to anterior cervical lip.  Paracervical block 10 cc 1% lidocaine, lot [id number], exp Jul 2026. Pipelle passed to 6 cm x 2.  Tissue to pathology.  No complications.  Minimal EBL.   Chaperone was present for exam:  Warren Lacy, CMA  ASSESSMENT  Simple endometrial hyperplasia.  Potential postmenopausal bleeding.  On Provera 10 mg daily.   PLAN  FU EMB.  Refill of Provera 10 mg daily, #30, RF none.  Will await bx results but may consider Provera 20 mg for potentially 6 more months and do follow up bx again then.

## 2023-06-07 ENCOUNTER — Other Ambulatory Visit: Payer: Medicare Other

## 2023-06-07 DIAGNOSIS — Z0000 Encounter for general adult medical examination without abnormal findings: Secondary | ICD-10-CM

## 2023-06-07 LAB — CBC WITH DIFFERENTIAL/PLATELET
Basophils Absolute: 0.1 10*3/uL (ref 0.0–0.1)
Basophils Relative: 0.8 % (ref 0.0–3.0)
Eosinophils Absolute: 0.3 10*3/uL (ref 0.0–0.7)
Eosinophils Relative: 4.1 % (ref 0.0–5.0)
HCT: 41.5 % (ref 36.0–46.0)
Hemoglobin: 13.3 g/dL (ref 12.0–15.0)
Lymphocytes Relative: 36.5 % (ref 12.0–46.0)
Lymphs Abs: 2.6 10*3/uL (ref 0.7–4.0)
MCHC: 32.1 g/dL (ref 30.0–36.0)
MCV: 86.5 fl (ref 78.0–100.0)
Monocytes Absolute: 0.5 10*3/uL (ref 0.1–1.0)
Monocytes Relative: 6.7 % (ref 3.0–12.0)
Neutro Abs: 3.6 10*3/uL (ref 1.4–7.7)
Neutrophils Relative %: 51.9 % (ref 43.0–77.0)
Platelets: 317 10*3/uL (ref 150.0–400.0)
RBC: 4.8 Mil/uL (ref 3.87–5.11)
RDW: 13.6 % (ref 11.5–15.5)
WBC: 7 10*3/uL (ref 4.0–10.5)

## 2023-06-07 LAB — COMPREHENSIVE METABOLIC PANEL
ALT: 12 U/L (ref 0–35)
AST: 16 U/L (ref 0–37)
Albumin: 4.1 g/dL (ref 3.5–5.2)
Alkaline Phosphatase: 68 U/L (ref 39–117)
BUN: 13 mg/dL (ref 6–23)
CO2: 29 mEq/L (ref 19–32)
Calcium: 9.4 mg/dL (ref 8.4–10.5)
Chloride: 103 mEq/L (ref 96–112)
Creatinine, Ser: 1.05 mg/dL (ref 0.40–1.20)
GFR: 52.64 mL/min — ABNORMAL LOW (ref >60.00)
Glucose, Bld: 105 mg/dL — ABNORMAL HIGH (ref 70–99)
Potassium: 3.8 mEq/L (ref 3.5–5.1)
Sodium: 139 mEq/L (ref 135–145)
Total Bilirubin: 0.5 mg/dL (ref 0.2–1.2)
Total Protein: 6.6 g/dL (ref 6.0–8.3)

## 2023-06-07 LAB — LIPID PANEL
Cholesterol: 163 mg/dL (ref 0–200)
HDL: 59.7 mg/dL (ref >39.00)
LDL Cholesterol: 83 mg/dL (ref 0–99)
NonHDL: 103.36
Total CHOL/HDL Ratio: 3
Triglycerides: 103 mg/dL (ref 0.0–149.0)
VLDL: 20.6 mg/dL (ref 0.0–40.0)

## 2023-06-07 LAB — TSH: TSH: 3.14 u[IU]/mL (ref 0.35–5.50)

## 2023-06-10 ENCOUNTER — Other Ambulatory Visit: Payer: Medicare Other

## 2023-06-13 ENCOUNTER — Encounter (INDEPENDENT_AMBULATORY_CARE_PROVIDER_SITE_OTHER): Payer: Self-pay

## 2023-06-14 ENCOUNTER — Other Ambulatory Visit: Payer: Self-pay

## 2023-06-14 ENCOUNTER — Telehealth: Payer: Self-pay | Admitting: Psychiatry

## 2023-06-14 DIAGNOSIS — F5101 Primary insomnia: Secondary | ICD-10-CM

## 2023-06-14 MED ORDER — BELSOMRA 15 MG PO TABS: 15.0000 mg | ORAL_TABLET | Freq: Every day | ORAL | 2 refills | Status: DC

## 2023-06-14 NOTE — Telephone Encounter (Signed)
Pended.

## 2023-06-14 NOTE — Telephone Encounter (Signed)
Lori Harvey requesting a Rx for Belsomra. She stated she had one on file but never used it. So it has expired. Send to  [id number] - [place], [place] - [address] [place] [address] Phone: [phone]  Fax: [phone]   Appointment 09/09/23 Contact # [phone]

## 2023-06-15 ENCOUNTER — Other Ambulatory Visit: Payer: Self-pay | Admitting: Family Medicine

## 2023-06-17 ENCOUNTER — Telehealth: Payer: Self-pay

## 2023-06-17 ENCOUNTER — Ambulatory Visit (INDEPENDENT_AMBULATORY_CARE_PROVIDER_SITE_OTHER): Payer: Medicare Other | Admitting: Family Medicine

## 2023-06-17 VITALS — BP 120/60 | HR 95 | Temp 98.6°F | Ht 65.35 in | Wt 160.1 lb

## 2023-06-17 DIAGNOSIS — Z0000 Encounter for general adult medical examination without abnormal findings: Secondary | ICD-10-CM | POA: Diagnosis not present

## 2023-06-17 DIAGNOSIS — M8580 Other specified disorders of bone density and structure, unspecified site: Secondary | ICD-10-CM

## 2023-06-17 NOTE — Progress Notes (Signed)
Established Patient Office Visit  Subjective   Patient ID: Lori Harvey, female    DOB: Jun 01, 1951  Age: 72 y.o. MRN: [id number]  No chief complaint on file.   HPI   Lori Harvey is seen for complete physical.  She has history of hypertension, obstructive sleep apnea, perennial allergic rhinitis, osteopenia, history of recurrent depression, history of melanoma She has been followed by GYN and has history of thickened endometrium and pending biopsy later this week.  Her last DEXA scan on record was 2021 which showed osteopenia with T-score -2.3 femur.  She continues to battle with some insomnia issues.  She has taken trazodone and previously took Ambien.  Recently was prescribed Belsomra but has not yet started.  She has had some fatigue especially past few weeks and she thinks some of this may be related to poor sleep quality.  History of elevated coronary calcium score.  Most of this was RCA.  Her score was 850.  Takes low-dose rosuvastatin 5 mg every other day.  She has had some concerns about possible statin related myalgias and muscle stiffness.  Recent LDL cholesterol improved at 83.  Health maintenance reviewed:  Health Maintenance  Topic Date Due   COVID-19 Vaccine (4 - 2023-24 season) 06/29/2022   INFLUENZA VACCINE  05/30/2023   Medicare Annual Wellness (AWV)  07/18/2023   Colonoscopy  01/12/2024   DTaP/Tdap/Td (3 - Td or Tdap) 02/12/2024   MAMMOGRAM  05/20/2024   Pneumonia Vaccine 22+ Years old  Completed   DEXA SCAN  Completed   Hepatitis C Screening  Completed   Zoster Vaccines- Shingrix  Completed   HPV VACCINES  Aged Out   Social history-she been widowed for several years.  She retired few years ago.  Never smoked.  Alcoholic beverages about twice per week.  Enjoys going to the beach-has a place at [facility] history-other had breast cancer and also history of stroke.  Her father had history of alcohol abuse and heart disease.  Sister with history of colon  cancer.  She has another sister who has hypertension and history of colon polyps.  Brother with prostate cancer.  Past Medical History:  Diagnosis Date   Arthritis    Cancer (HCC)    right leg, upper thigh AND CALF melanoma   Depression    Family history of adverse reaction to anesthesia    older sisters x 2 with ponv   GERD (gastroesophageal reflux disease)    Hypertension    Patient denies   Melanoma (HCC)    Obstructive sleep apnea    uses cpap some nights set on 17 per cpap titration report 04-18-2020 epic   Osteopenia    PMB (postmenopausal bleeding)    Pre-diabetes    UTI (lower urinary tract infection)    Wears glasses    Past Surgical History:  Procedure Laterality Date   BREAST BIOPSY Left    several yrs ago   COLONOSCOPY  2020   DILATATION & CURETTAGE/HYSTEROSCOPY WITH MYOSURE N/A 06/05/2022   Procedure: DILATATION & CURETTAGE/HYSTEROSCOPY WITH MYOSURE;  Surgeon: Romualdo Bolk, MD;  Location: [facility] ;  Service: Gynecology;  Laterality: N/A;   MELANOMA EXCISION  2006   right thigh and calf   pre melamona area removed  10/2021   REFRACTIVE SURGERY     squamous cell area removed  11/2021   upper left leg   TONSILLECTOMY     as child   WISDOM TOOTH EXTRACTION  many yrs ago    reports that she has never smoked. She has never used smokeless tobacco. She reports current alcohol use of about 1.0 - 2.0 standard drink of alcohol per week. She reports that she does not use drugs. family history includes Alcohol abuse in her father; Anxiety disorder in her maternal aunt; Breast cancer in her mother; Cancer in her mother; Colon cancer (age of onset: 27) in her sister; Colon polyps in her sister and sister; Depression in her mother and sister; Heart attack in her father; Hypertension in her sister and sister; Other (age of onset: 26) in her sister; Other (age of onset: 19) in her sister; Prostate cancer in her brother; Stroke in her mother. Allergies   Allergen Reactions   Keflex [Cephalexin]     Nausea, hives   Sulfa Antibiotics Rash     Review of Systems  Constitutional:  Positive for malaise/fatigue. Negative for chills, fever and weight loss.  HENT:  Negative for hearing loss.   Eyes:  Negative for blurred vision and double vision.  Respiratory:  Negative for cough and shortness of breath.   Cardiovascular:  Negative for chest pain, palpitations and leg swelling.  Gastrointestinal:  Negative for abdominal pain, blood in stool, constipation and diarrhea.  Genitourinary:  Negative for dysuria.  Skin:  Negative for rash.  Neurological:  Negative for dizziness, speech change, seizures, loss of consciousness and headaches.  Psychiatric/Behavioral:  Negative for depression. The patient has insomnia.       Objective:     BP 120/60 (BP Location: Left Arm, Patient Position: Sitting, Cuff Size: Normal)   Pulse 95   Temp 98.6 F (37 C) (Oral)   Ht 5' 5.35" (1.66 m)   Wt 160 lb 1.6 oz (72.6 kg)   LMP 08/29/2002 (Approximate)   SpO2 98%   BMI 26.35 kg/m  BP Readings from Last 3 Encounters:  06/17/23 120/60  12/13/22 128/74  06/12/22 134/72   Wt Readings from Last 3 Encounters:  06/17/23 160 lb 1.6 oz (72.6 kg)  12/13/22 161 lb (73 kg)  07/17/22 164 lb (74.4 kg)      Physical Exam Vitals reviewed.  Constitutional:      Appearance: She is well-developed.  HENT:     Head: Normocephalic and atraumatic.  Eyes:     Pupils: Pupils are equal, round, and reactive to light.  Neck:     Thyroid: No thyromegaly.  Cardiovascular:     Rate and Rhythm: Normal rate and regular rhythm.     Heart sounds: Normal heart sounds. No murmur heard. Pulmonary:     Effort: No respiratory distress.     Breath sounds: Normal breath sounds. No wheezing or rales.  Abdominal:     General: Bowel sounds are normal. There is no distension.     Palpations: Abdomen is soft. There is no mass.     Tenderness: There is no abdominal tenderness.  There is no guarding or rebound.  Musculoskeletal:        General: Normal range of motion.     Cervical back: Normal range of motion and neck supple.     Right lower leg: No edema.     Left lower leg: No edema.  Lymphadenopathy:     Cervical: No cervical adenopathy.  Skin:    Findings: No rash.  Neurological:     Mental Status: She is alert and oriented to person, place, and time.     Cranial Nerves: No cranial nerve deficit.  Psychiatric:  Behavior: Behavior normal.        Thought Content: Thought content normal.        Judgment: Judgment normal.      No results found for any visits on 06/17/23.  Last CBC Lab Results  Component Value Date   WBC 7.0 06/07/2023   HGB 13.3 06/07/2023   HCT 41.5 06/07/2023   MCV 86.5 06/07/2023   MCH 27.7 09/08/2019   RDW 13.6 06/07/2023   PLT 317.0 06/07/2023   Last metabolic panel Lab Results  Component Value Date   GLUCOSE 105 (H) 06/07/2023   NA 139 06/07/2023   K 3.8 06/07/2023   CL 103 06/07/2023   CO2 29 06/07/2023   BUN 13 06/07/2023   CREATININE 1.05 06/07/2023   GFR 52.64 (L) 06/07/2023   CALCIUM 9.4 06/07/2023   PHOS 3.5 10/01/2018   PROT 6.6 06/07/2023   ALBUMIN 4.1 06/07/2023   LABGLOB 2.4 09/08/2019   AGRATIO 1.8 09/08/2019   BILITOT 0.5 06/07/2023   ALKPHOS 68 06/07/2023   AST 16 06/07/2023   ALT 12 06/07/2023   Last lipids Lab Results  Component Value Date   CHOL 163 06/07/2023   HDL 59.70 06/07/2023   LDLCALC 83 06/07/2023   TRIG 103.0 06/07/2023   CHOLHDL 3 06/07/2023   Last thyroid functions Lab Results  Component Value Date   TSH 3.14 06/07/2023      The 10-year ASCVD risk score (Arnett DK, et al., 2019) is: 14.7%    Assessment & Plan:   Physical exam.  72 year old female with history of hypertension, obstructive sleep apnea, osteopenia, elevated coronary calcium score, history of recurrent depression, history of melanoma.  Recent LDL cholesterol 83.  Last DEXA scan on record about 3  years ago.  We reviewed and suggested the following:  -Continue annual flu vaccine -Recent lab work reviewed -Set up repeat DEXA scan -Question of muscle and joint pain related to statin.  She will try holiday off this.  If symptoms do not improve consider increasing her rosuvastatin.  If she does have improvement off statin consider addition of Zetia.  We did briefly discuss PCSK9 inhibitors -She is followed by GYN for Pap smears and mammograms -Pneumonia vaccines and Shingrix complete -Prior hepatitis C screen negative -Mammogram up-to-date  Evelena Peat, MD

## 2023-06-17 NOTE — Patient Instructions (Signed)
Confirm date of last DEXA  Remember flu vaccine this Fall.

## 2023-06-17 NOTE — Telephone Encounter (Signed)
Former JJ pt scheduled for an EMB with you on 8/22 calling to report remembering procedure with JJ previously being very painful and near impossible to tolerate.   Inquiring if there could be any kind of prescription to be sent in to help "take the edge off" prior to procedure.   I can advise the pt about the paracervical block that you offer them prior to the insertion of the pipelle. However, please advise if there is anything additional that you would like to add or offer. Thanks.

## 2023-06-18 ENCOUNTER — Other Ambulatory Visit: Payer: Self-pay | Admitting: Obstetrics and Gynecology

## 2023-06-18 MED ORDER — ALPRAZOLAM 0.25 MG PO TABS: ORAL_TABLET | ORAL | 0 refills | Status: DC

## 2023-06-18 NOTE — Telephone Encounter (Signed)
I can give her a prescription for Xanax 0.25 mg for her to take one hour prior to her procedure.  This medication will cause potential sleepiness and dizziness.   She will need a driver to and from her appointment.   She will need to sign a consent form in advance.   Please let me know if she would like this prescription.

## 2023-06-18 NOTE — Telephone Encounter (Signed)
Pt notified and voiced understanding. Pt is agreeable/accepting of rx for Xanax 0.25mg  and also desires to have paracervical block.   I also advised pt that she is welcome to also take her choice of OTC NSAID 1hr prior to procedure as well. Please confirm if that was ok advice. Thanks.   Pharmacy in EMR is UTD for rx.  Pt will either come by office this afternoon or will notify us when available to do consent over the phone if not able to stop by prior to appt.

## 2023-06-18 NOTE — Telephone Encounter (Signed)
I sent a prescription to her pharmacy for Xanax.  She will take one tablet one hour prior to her procedure.   Ok to take an anti-inflammatory of choice one hour prior to procedure also.  I will plan to do a paracervical block.

## 2023-06-18 NOTE — Progress Notes (Signed)
Rx for Xanax for premedication prior to procedure.

## 2023-06-18 NOTE — Telephone Encounter (Signed)
Pt notified and voiced understanding. Plans to stop by in AM to sign consent form for procedure. Routing to provider for final review and closing encounter.

## 2023-06-20 ENCOUNTER — Other Ambulatory Visit (HOSPITAL_COMMUNITY)
Admission: RE | Admit: 2023-06-20 | Discharge: 2023-06-20 | Disposition: A | Discharge status: Home / Self Care | Payer: Medicare Other | Source: Ambulatory Visit | Attending: Obstetrics and Gynecology | Admitting: Obstetrics and Gynecology

## 2023-06-20 ENCOUNTER — Encounter: Payer: Self-pay | Admitting: Obstetrics and Gynecology

## 2023-06-20 ENCOUNTER — Ambulatory Visit: Payer: Medicare Other | Admitting: Obstetrics and Gynecology

## 2023-06-20 DIAGNOSIS — N8501 Benign endometrial hyperplasia: Secondary | ICD-10-CM | POA: Diagnosis present

## 2023-06-20 DIAGNOSIS — Z79890 Hormone replacement therapy (postmenopausal): Secondary | ICD-10-CM

## 2023-06-20 MED ORDER — MEDROXYPROGESTERONE ACETATE 10 MG PO TABS
10.0000 mg | ORAL_TABLET | Freq: Every day | ORAL | 0 refills | Status: DC
Start: 2023-06-20 — End: 2023-07-15

## 2023-06-20 NOTE — Patient Instructions (Signed)
 Endometrial Biopsy  An endometrial biopsy is a procedure where a tissue sample is removed from the lining of the uterus. This lining is called the endometrium. The tissue sample is then sent to a lab for testing. You may have this type of biopsy to check for: Cancer. Infection. Growths called polyps. Uterine bleeding that can't be explained. Tell a health care provider about: Any allergies you have. All medicines you're taking including vitamins, herbs, eye drops, creams, and over-the-counter medicines. Any problems you or family members have had with anesthesia. Any bleeding problems you have. Any surgeries you have had. Any medical problems you have. Whether you're pregnant or may be pregnant. What are the risks? Your health care provider will talk with you about risks. These may include: Bleeding. Infection. Allergic reactions to medicines. Damage to the wall of the uterus. This is rare. What happens before the procedure? Keep track of your period. You may need to have this biopsy when you're not having your period. Ask your provider about: Changing or stopping your regular medicines. These include any diabetes medicines or blood thinners you take. Taking medicines such as aspirin and ibuprofen. These medicines can thin your blood. Do not take them unless your provider tells you to. Taking over-the-counter medicines, vitamins, herbs, and supplements. Bring a pad with you. You may need to wear one after the biopsy. Plan to have a responsible adult take you home from the hospital or clinic. You won't be allowed to drive. What happens during the procedure? A tool will be put into your vagina to hold it open. This helps your provider see the cervix. The cervix is the lowest part of the uterus. Your cervix will be cleaned with a solution that kills germs. You will be given anesthesia. This keeps you from feeling pain. It will numb your cervix. A tool called forceps will be used to  hold your cervix steady. A thin tool called a uterine sound will be put through your cervix. It will be used to: Find the length of your uterus. Find where to take the sample from. A soft tube called a catheter will be put into your uterus. The catheter will remove a tissue sample. The tube and tools will be removed. The sample will be sent to a lab for testing. The procedure may vary among providers and hospitals. What happens after the procedure? Your blood pressure, heart rate, breathing rate, and blood oxygen level will be monitored until you leave the hospital or clinic. It's up to you to get the results of your procedure. Ask your provider, or the department that is doing the procedure, when your results will be ready. This information is not intended to replace advice given to you by your health care provider. Make sure you discuss any questions you have with your health care provider. Document Revised: 12/25/2022 Document Reviewed: 12/25/2022 Elsevier Patient Education  2024 [employer].

## 2023-06-24 LAB — SURGICAL PATHOLOGY

## 2023-06-25 ENCOUNTER — Other Ambulatory Visit: Payer: Self-pay

## 2023-06-25 DIAGNOSIS — N950 Postmenopausal bleeding: Secondary | ICD-10-CM

## 2023-06-30 ENCOUNTER — Other Ambulatory Visit: Payer: Self-pay | Admitting: Family Medicine

## 2023-07-14 ENCOUNTER — Other Ambulatory Visit: Payer: Self-pay | Admitting: Obstetrics and Gynecology

## 2023-07-14 DIAGNOSIS — N8501 Benign endometrial hyperplasia: Secondary | ICD-10-CM

## 2023-07-15 NOTE — Telephone Encounter (Signed)
Med refill request: Provera  Last OV: 06/20/23 Next AEX: n/a Last MMG (if hormonal med) 05/21/23 Refill authorized: Please Advise, #[phone], 1 RF

## 2023-07-16 ENCOUNTER — Ambulatory Visit (HOSPITAL_BASED_OUTPATIENT_CLINIC_OR_DEPARTMENT_OTHER)
Admission: RE | Admit: 2023-07-16 | Discharge: 2023-07-16 | Disposition: A | Discharge status: Home / Self Care | Payer: Medicare Other | Source: Ambulatory Visit | Attending: Obstetrics and Gynecology | Admitting: Obstetrics and Gynecology

## 2023-07-16 DIAGNOSIS — N950 Postmenopausal bleeding: Secondary | ICD-10-CM | POA: Insufficient documentation

## 2023-07-16 DIAGNOSIS — R9389 Abnormal findings on diagnostic imaging of other specified body structures: Secondary | ICD-10-CM | POA: Diagnosis not present

## 2023-07-19 ENCOUNTER — Other Ambulatory Visit: Payer: Self-pay | Admitting: Obstetrics and Gynecology

## 2023-07-19 MED ORDER — ALPRAZOLAM 0.25 MG PO TABS: ORAL_TABLET | ORAL | 0 refills | Status: DC

## 2023-07-19 NOTE — Progress Notes (Signed)
Rx for Xanax to take pre-procedure for endometrial biopsy.

## 2023-07-22 ENCOUNTER — Telehealth: Payer: Self-pay

## 2023-07-22 NOTE — Telephone Encounter (Signed)
Unsuccessful attempt to reach patient on preferred number listed in notes for scheduled AWV. Left message on voicemail ok to reschedule.

## 2023-07-30 NOTE — Progress Notes (Signed)
GYNECOLOGY  VISIT   HPI: 72 y.o.   Widowed  Caucasian  female   [id number] with Patient's last menstrual period was 08/29/2002 (approximate).   here for   endometrial bx.  Patient has simple endometrial hyperplasia.  Last EMB on 06/20/23 was negative for endometrium.  Taking Provera 10 mg daily.  She has a slight stain not every day.  The color is rusty tan color.  No pain.   Patient took xanax 0.25 mg in preparation for the procedure.    Narrative & Impression  CLINICAL DATA:  Postmenopausal bleeding for 2.5 years.   EXAM: TRANSABDOMINAL AND TRANSVAGINAL ULTRASOUND OF PELVIS   TECHNIQUE: Both transabdominal and transvaginal ultrasound examinations of the pelvis were performed. Transabdominal technique was performed for global imaging of the pelvis including uterus, ovaries, adnexal regions, and pelvic cul-de-sac. It was necessary to proceed with endovaginal exam following the transabdominal exam to visualize the uterus, endometrium ovaries, and adnexal regions.   COMPARISON:  Ultrasound 05/03/2022 (no report available)   FINDINGS: Uterus   Measurements: 5.3 x 1.7 x 2.8 centimeters = volume: 12.9 mL. Uterus is anteverted and normal in appearance.   Endometrium   Thickness: 5.4 millimeters.  No focal abnormality visualized.   Right ovary   Measurements: Not visualized.  No adnexal mass identified.   Left ovary   Measurements: 1.4 x 0.9 x 1.1 centimeters = volume: 0.7 mL. Normal appearance/no adnexal mass.   Other findings   No abnormal free fluid.   IMPRESSION: 1. Normal appearance of the uterus. 2. Endometrium is homogeneous but thickened, 5.4 millimeters. In the setting of post-menopausal bleeding, endometrial sampling is indicated to exclude carcinoma. If results are benign, sonohysterogram should be considered for focal lesion work-up. (Ref: Radiological Reasoning: Algorithmic Workup of Abnormal Vaginal Bleeding with Endovaginal Sonography and  Sonohysterography. AJR 2008; 161:W96-04) 3. Nonvisualized RIGHT ovary. 4. Normal LEFT ovary.     Electronically Signed   By: Norva Pavlov M.D.   On: 07/26/2023 09:54    GYNECOLOGIC HISTORY: Patient's last menstrual period was 08/29/2002 (approximate). 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   Major depressive disorder, recurrent episode, in full remission (HCC) 07/14/2018   Pain in joint of right elbow 12/04/2017   Osteopenia 02/11/2014   Benign paroxysmal positional vertigo 02/11/2014   Melanoma (HCC) 03/31/2012   History of depression 03/31/2012   Essential hypertension 03/31/2012    Past Medical History:  Diagnosis Date   Arthritis    Cancer (HCC)    right leg, upper thigh AND CALF melanoma   Depression    Family history of adverse reaction to anesthesia    older sisters x 2 with ponv   GERD (  gastroesophageal reflux disease)    Hypertension    Patient denies   Melanoma (HCC)    Obstructive sleep apnea    uses cpap some nights set on 17 per cpap titration report 04-18-2020 epic   Osteopenia    PMB (postmenopausal bleeding)    Pre-diabetes    UTI (lower urinary tract infection)    Wears glasses     Past Surgical History:  Procedure Laterality Date   BREAST BIOPSY Left    several yrs ago   COLONOSCOPY  2020   DILATATION & CURETTAGE/HYSTEROSCOPY WITH MYOSURE N/A 06/05/2022   Procedure: DILATATION & CURETTAGE/HYSTEROSCOPY WITH MYOSURE;   Surgeon: Romualdo Bolk, MD;  Location: [facility];  Service: Gynecology;  Laterality: N/A;   MELANOMA EXCISION  2006   right thigh and calf   pre melamona area removed  10/2021   REFRACTIVE SURGERY     squamous cell area removed  11/2021   upper left leg   TONSILLECTOMY     as child   WISDOM TOOTH EXTRACTION     many yrs ago    Current Outpatient Medications  Medication Sig Dispense Refill   ALPRAZolam (XANAX) 0.25 MG tablet Take one tablet (0.25 mg) my mouth one hour prior to procedure. 2 tablet 0   amLODipine (NORVASC) 2.5 MG tablet TAKE 1 TABLET BY MOUTH EVERY DAY 90 tablet 2   aspirin EC 81 MG tablet Take 1 tablet (81 mg total) by mouth daily. Swallow whole. 90 tablet 3   buPROPion (WELLBUTRIN XL) 150 MG 24 hr tablet Take 1 tablet (150 mg total) by mouth daily. 90 tablet 1   Co-Enzyme Q10 100 MG CAPS Take 1 capsule by mouth daily.     DULoxetine (CYMBALTA) 30 MG capsule TAKE 1 CAPSULES BY MOUTH DAILY WITH 60MG  CAPSULE TO EQUAL TOTAL DAILY DOSE OF 90MG  90 capsule 1   DULoxetine (CYMBALTA) 60 MG capsule TAKE 1 CAPSULE BY MOUTH DAILY WITH 30MG  CAPSULE TO EQUAL TOTAL DAILY DOSE OF 90MG  90 capsule 1   Ginkgo Biloba 40 MG TABS      losartan-hydrochlorothiazide (HYZAAR) 100-12.5 MG tablet TAKE 1 TABLET BY MOUTH EVERY DAY *SCHEDULE APPT FOR FUTURE REFILLS* 90 tablet 1   medroxyPROGESTERone (PROVERA) 10 MG tablet TAKE 1 TABLET BY MOUTH EVERY DAY 90 tablet 0   Multiple Vitamin (MULTIVITAMIN) capsule Take 1 capsule by mouth daily.     Omega-3 Fatty Acids (FISH OIL PO) Take by mouth daily.     rosuvastatin (CRESTOR) 5 MG tablet Take 1 tablet (5 mg total) by mouth every other day. 45 tablet 3   Suvorexant (BELSOMRA) 15 MG TABS Take 1 tablet (15 mg total) by mouth at bedtime. 30 tablet 2   traZODone (DESYREL) 50 MG tablet Take 50 mg by mouth at bedtime as needed for sleep.     TYRVAYA 0.03 MG/ACT SOLN Place into both nostrils.     No current facility-administered  medications for this visit.     ALLERGIES: Keflex [cephalexin] and Sulfa antibiotics  Family History  Problem Relation Age of Onset   Cancer Mother        breast   Stroke Mother    Breast cancer Mother    Depression Mother    Alcohol abuse Father    Heart attack Father    Other Sister 28       colon mass, benign   Hypertension Sister    Colon cancer Sister 65   Depression Sister    Colon polyps  Sister    Prostate cancer Brother    Other Sister 35       colon mass- not colon cancer   Hypertension Sister    Colon polyps Sister    Anxiety disorder Maternal Aunt    Esophageal cancer Neg Hx    Rectal cancer Neg Hx    Stomach cancer Neg Hx     Social History   Socioeconomic History   Marital status: Widowed    Spouse name: Not on file   Number of children: 0   Years of education: Not on file   Highest education level: Bachelor's degree (e.g., BA, AB, BS)  Occupational History   Occupation: Retired Engineer, site and [employer]  Tobacco Use   Smoking status: Never   Smokeless tobacco: Never  Vaping Use   Vaping status: Never Used  Substance and Sexual Activity   Alcohol use: Yes    Alcohol/week: 1.0 - 2.0 standard drink of alcohol    Types: 1 - 2 Glasses of wine per week    Comment: 1 to  3 glasses wine per week   Drug use: No   Sexual activity: Not Currently    Birth control/protection: Post-menopausal  Other Topics Concern   Not on file  Social History Narrative   Not on file   Social Determinants of Health   Financial Resource Strain: Low Risk  (07/17/2022)   Overall Financial Resource Strain (CARDIA)    Difficulty of Paying Living Expenses: Not hard at all  Food Insecurity: No Food Insecurity (07/17/2022)   Hunger Vital Sign    Worried About Running Out of Food in the Last Year: Never true    Ran Out of Food in the Last Year: Never true  Transportation Needs: No Transportation Needs (07/17/2022)   PRAPARE - Scientist, research (physical sciences) (Medical): No    Lack of Transportation (Non-Medical): No  Physical Activity: Sufficiently Active (07/17/2022)   Exercise Vital Sign    Days of Exercise per Week: 4 days    Minutes of Exercise per Session: 60 min  Stress: No Stress Concern Present (07/17/2022)   [employer] of Occupational Health - Occupational Stress Questionnaire    Feeling of Stress : Not at all  Social Connections: Moderately Integrated (04/09/2022)   Social Connection and Isolation Panel [NHANES]    Frequency of Communication with Friends and Family: More than three times a week    Frequency of Social Gatherings with Friends and Family: Three times a week    Attends Religious Services: More than 4 times per year    Active Member of Clubs or Organizations: Yes    Attends Banker Meetings: More than 4 times per year    Marital Status: Widowed  Intimate Partner Violence: Not At Risk (07/04/2021)   Humiliation, Afraid, Rape, and Kick questionnaire    Fear of Current or Ex-Partner: No    Emotionally Abused: No    Physically Abused: No    Sexually Abused: No    Review of Systems  All other systems reviewed and are negative.   PHYSICAL EXAMINATION:    BP 132/82 (BP Location: Left Arm, Patient Position: Sitting, Cuff Size: Normal)   Pulse 89   Ht 5' 5.5" (1.664 m)   Wt 161 lb (73 kg)   LMP 08/29/2002 (Approximate)   SpO2 98%   BMI 26.38 kg/m     General appearance: alert, cooperative and appears stated age  EMB: Consent for procedure. Sterile  prep with   Paracervical block with 1% lidocaine 10 cc, lot [id number], exp Jul 2026 Tenaculum to anterior cervical lip. Rocket EMB passed to  almost  6 cm twice, 360 degrees of sampling obtained.  Tissue to pathology.  Small specimen obtained.  Gritty endometrial texture noted with the sampling.  Minimal EBL. No complications.   Pelvic exam Normal external genitalia.  Normal urethra.  Atrophy of the cervix noted.  Normal uterus,  nontender.  No adnexal mass or tenderness.   Chaperone was present for exam:  Warren Lacy, CMA  ASSESSMENT  Simple endometrial hyperplasia.  Tx with Provera 10 mg daily.   Vaginal discharge.  I suspect this may be due to atrophy.   PLAN  Fu EMB.  If this specimen is nondiagnostic, I would not repeat the sampling at this time.  If that is the final dx of the biopsy, I would consider continuation of the Provera for 6 additional months and then pelvic ultrasound to reassess.  Post biopsy precautions to patient.

## 2023-08-13 ENCOUNTER — Encounter: Payer: Self-pay | Admitting: Obstetrics and Gynecology

## 2023-08-13 ENCOUNTER — Other Ambulatory Visit (HOSPITAL_COMMUNITY)
Admission: RE | Admit: 2023-08-13 | Discharge: 2023-08-13 | Disposition: A | Discharge status: Home / Self Care | Payer: Medicare Other | Source: Ambulatory Visit | Attending: Obstetrics and Gynecology | Admitting: Obstetrics and Gynecology

## 2023-08-13 ENCOUNTER — Ambulatory Visit (INDEPENDENT_AMBULATORY_CARE_PROVIDER_SITE_OTHER): Payer: Medicare Other | Admitting: Obstetrics and Gynecology

## 2023-08-13 VITALS — BP 132/82 | HR 89 | Ht 65.5 in | Wt 161.0 lb

## 2023-08-13 DIAGNOSIS — N8501 Benign endometrial hyperplasia: Secondary | ICD-10-CM | POA: Insufficient documentation

## 2023-08-13 NOTE — Patient Instructions (Signed)
Endometrial Biopsy  An endometrial biopsy is a procedure where a tissue sample is removed from the lining of the uterus. This lining is called the endometrium. The tissue sample is then sent to a lab for testing. You may have this type of biopsy to check for: Cancer. Infection. Growths called polyps. Uterine bleeding that can't be explained. Tell a health care provider about: Any allergies you have. All medicines you're taking including vitamins, herbs, eye drops, creams, and over-the-counter medicines. Any problems you or family members have had with anesthesia. Any bleeding problems you have. Any surgeries you have had. Any medical problems you have. Whether you're pregnant or may be pregnant. What are the risks? Your health care provider will talk with you about risks. These may include: Bleeding. Infection. Allergic reactions to medicines. Damage to the wall of the uterus. This is rare. What happens before the procedure? Keep track of your period. You may need to have this biopsy when you're not having your period. Ask your provider about: Changing or stopping your regular medicines. These include any diabetes medicines or blood thinners you take. Taking medicines such as aspirin and ibuprofen. These medicines can thin your blood. Do not take them unless your provider tells you to. Taking over-the-counter medicines, vitamins, herbs, and supplements. Bring a pad with you. You may need to wear one after the biopsy. Plan to have a responsible adult take you home from the hospital or clinic. You won't be allowed to drive. What happens during the procedure? A tool will be put into your vagina to hold it open. This helps your provider see the cervix. The cervix is the lowest part of the uterus. Your cervix will be cleaned with a solution that kills germs. You will be given anesthesia. This keeps you from feeling pain. It will numb your cervix. A tool called forceps will be used to  hold your cervix steady. A thin tool called a uterine sound will be put through your cervix. It will be used to: Find the length of your uterus. Find where to take the sample from. A soft tube called a catheter will be put into your uterus. The catheter will remove a tissue sample. The tube and tools will be removed. The sample will be sent to a lab for testing. The procedure may vary among providers and hospitals. What happens after the procedure? Your blood pressure, heart rate, breathing rate, and blood oxygen level will be monitored until you leave the hospital or clinic. It's up to you to get the results of your procedure. Ask your provider, or the department that is doing the procedure, when your results will be ready. This information is not intended to replace advice given to you by your health care provider. Make sure you discuss any questions you have with your health care provider. Document Revised: 12/25/2022 Document Reviewed: 12/25/2022 Elsevier Patient Education  2024 [employer].

## 2023-08-15 ENCOUNTER — Ambulatory Visit (HOSPITAL_BASED_OUTPATIENT_CLINIC_OR_DEPARTMENT_OTHER)
Admission: RE | Admit: 2023-08-15 | Discharge: 2023-08-15 | Disposition: A | Discharge status: Home / Self Care | Payer: Medicare Other | Source: Ambulatory Visit | Attending: Family Medicine | Admitting: Family Medicine

## 2023-08-15 DIAGNOSIS — Z780 Asymptomatic menopausal state: Secondary | ICD-10-CM | POA: Insufficient documentation

## 2023-08-15 DIAGNOSIS — M85852 Other specified disorders of bone density and structure, left thigh: Secondary | ICD-10-CM | POA: Insufficient documentation

## 2023-08-15 DIAGNOSIS — M8580 Other specified disorders of bone density and structure, unspecified site: Secondary | ICD-10-CM | POA: Insufficient documentation

## 2023-08-15 DIAGNOSIS — M8589 Other specified disorders of bone density and structure, multiple sites: Secondary | ICD-10-CM | POA: Diagnosis not present

## 2023-08-15 LAB — SURGICAL PATHOLOGY

## 2023-08-19 ENCOUNTER — Other Ambulatory Visit: Payer: Self-pay | Admitting: Obstetrics and Gynecology

## 2023-08-19 DIAGNOSIS — N8501 Benign endometrial hyperplasia: Secondary | ICD-10-CM

## 2023-09-09 ENCOUNTER — Telehealth (INDEPENDENT_AMBULATORY_CARE_PROVIDER_SITE_OTHER): Payer: Medicare Other | Admitting: Psychiatry

## 2023-09-09 ENCOUNTER — Encounter: Payer: Self-pay | Admitting: Psychiatry

## 2023-09-09 DIAGNOSIS — F5101 Primary insomnia: Secondary | ICD-10-CM

## 2023-09-09 DIAGNOSIS — F3342 Major depressive disorder, recurrent, in full remission: Secondary | ICD-10-CM

## 2023-09-09 MED ORDER — BELSOMRA 20 MG PO TABS: 20.0000 mg | ORAL_TABLET | Freq: Every evening | ORAL | 5 refills | Status: DC

## 2023-09-09 MED ORDER — DULOXETINE HCL 60 MG PO CPEP
ORAL_CAPSULE | ORAL | 1 refills | Status: AC
Start: 2023-09-09 — End: ?

## 2023-09-09 MED ORDER — DULOXETINE HCL 30 MG PO CPEP: ORAL_CAPSULE | ORAL | 1 refills | Status: AC

## 2023-09-09 MED ORDER — BUPROPION HCL ER (XL) 150 MG PO TB24: 150.0000 mg | ORAL_TABLET | Freq: Every day | ORAL | 1 refills | Status: AC

## 2023-09-09 NOTE — Progress Notes (Signed)
Lori Harvey [id number] 24-Oct-1951 72 y.o.  Virtual Visit via Video Note  I connected with pt @ on 09/09/23 at  3:00 PM EST by a video enabled telemedicine application and verified that I am speaking with the correct person using two identifiers.   I discussed the limitations of evaluation and management by telemedicine and the availability of in person appointments. The patient expressed understanding and agreed to proceed.  I discussed the assessment and treatment plan with the patient. The patient was provided an opportunity to ask questions and all were answered. The patient agreed with the plan and demonstrated an understanding of the instructions.   The patient was advised to call back or seek an in-person evaluation if the symptoms worsen or if the condition fails to improve as anticipated.  I provided 33 minutes of non-face-to-face time during this encounter.  The patient was located in her personal vehicle in [place].  The provider was located at home.   Lori Harvey, PMHNP   Subjective:   Patient ID:  Lori Harvey is a 72 y.o. (DOB Mar 03, 1951) female.  Chief Complaint:  Chief Complaint  Patient presents with   Insomnia   Follow-up    Depression and Anxiety    Insomnia   Lori Harvey presents for follow-up of insomnia, anxiety, and depression.   She is taking Belsomra 15 mg at bedtime. She reports that she feels like she is having a partial response to Belsomra 15 mg. She reports that she is not getting a deep sleep and does not feel rested upon awakening. She reports that for awhile she felt like she was having difficulty getting to sleep and then took Belsomra an hour earlier. She typically wakes up once during the night.   Denies depressed mood. She reports some situational stress with move. Energy has been lower. Motivation has been ok. Appetite has been good. Concentration is adequate. Denies SI.   [place] condo [place].   Belsomra last filled  08/22/23. Alprazolam last filled 07/19/23.  Past Psychiatric Medication Trials: Sertraline Lexapro Celexa Cymbalta Wellbutrin Ambien Valium Trazodone Ambien  Review of Systems:  Review of Systems  Musculoskeletal:  Negative for gait problem.       Had joint pain with statin  Neurological:  Negative for tremors and headaches.  Psychiatric/Behavioral:  The patient has insomnia.        Please refer to HPI    Medications: I have reviewed the patient's current medications.  Current Outpatient Medications  Medication Sig Dispense Refill   Suvorexant (BELSOMRA) 20 MG TABS Take 1 tablet (20 mg total) by mouth at bedtime. 30 tablet 5   ALPRAZolam (XANAX) 0.25 MG tablet Take one tablet (0.25 mg) my mouth one hour prior to procedure. 2 tablet 0   amLODipine (NORVASC) 2.5 MG tablet TAKE 1 TABLET BY MOUTH EVERY DAY 90 tablet 2   aspirin EC 81 MG tablet Take 1 tablet (81 mg total) by mouth daily. Swallow whole. 90 tablet 3   buPROPion (WELLBUTRIN XL) 150 MG 24 hr tablet Take 1 tablet (150 mg total) by mouth daily. 90 tablet 1   Co-Enzyme Q10 100 MG CAPS Take 1 capsule by mouth daily.     DULoxetine (CYMBALTA) 30 MG capsule TAKE 1 CAPSULES BY MOUTH DAILY WITH 60MG  CAPSULE TO EQUAL TOTAL DAILY DOSE OF 90MG  90 capsule 1   DULoxetine (CYMBALTA) 60 MG capsule TAKE 1 CAPSULE BY MOUTH DAILY WITH 30MG  CAPSULE TO EQUAL TOTAL DAILY DOSE OF 90MG  90 capsule  1   Ginkgo Biloba 40 MG TABS      losartan-hydrochlorothiazide (HYZAAR) 100-12.5 MG tablet TAKE 1 TABLET BY MOUTH EVERY DAY *SCHEDULE APPT FOR FUTURE REFILLS* 90 tablet 1   medroxyPROGESTERone (PROVERA) 10 MG tablet TAKE 1 TABLET BY MOUTH EVERY DAY 90 tablet 0   Multiple Vitamin (MULTIVITAMIN) capsule Take 1 capsule by mouth daily.     Omega-3 Fatty Acids (FISH OIL PO) Take by mouth daily.     rosuvastatin (CRESTOR) 5 MG tablet Take 1 tablet (5 mg total) by mouth every other day. (Patient not taking: Reported on 09/09/2023) 45 tablet 3   traZODone  (DESYREL) 50 MG tablet Take 50 mg by mouth at bedtime as needed for sleep. (Patient not taking: Reported on 09/09/2023)     TYRVAYA 0.03 MG/ACT SOLN Place into both nostrils.     No current facility-administered medications for this visit.    Medication Side Effects: None  Allergies:  Allergies  Allergen Reactions   Keflex [Cephalexin]     Nausea, hives   Sulfa Antibiotics Rash    Past Medical History:  Diagnosis Date   Arthritis    Cancer (HCC)    right leg, upper thigh AND CALF melanoma   Depression    Family history of adverse reaction to anesthesia    older sisters x 2 with ponv   GERD (gastroesophageal reflux disease)    Hypertension    Patient denies   Melanoma (HCC)    Obstructive sleep apnea    uses cpap some nights set on 17 per cpap titration report 04-18-2020 epic   Osteopenia    PMB (postmenopausal bleeding)    Pre-diabetes    UTI (lower urinary tract infection)    Wears glasses     Family History  Problem Relation Age of Onset   Cancer Mother        breast   Stroke Mother    Breast cancer Mother    Depression Mother    Alcohol abuse Father    Heart attack Father    Other Sister 66       colon mass, benign   Hypertension Sister    Colon cancer Sister 51   Depression Sister    Colon polyps Sister    Prostate cancer Brother    Other Sister 25       colon mass- not colon cancer   Hypertension Sister    Colon polyps Sister    Anxiety disorder Maternal Aunt    Esophageal cancer Neg Hx    Rectal cancer Neg Hx    Stomach cancer Neg Hx     Social History   Socioeconomic History   Marital status: Widowed    Spouse name: Not on file   Number of children: 0   Years of education: Not on file   Highest education level: Bachelor's degree (e.g., BA, AB, BS)  Occupational History   Occupation: Retired Engineer, site and [employer]  Tobacco Use   Smoking status: Never   Smokeless tobacco: Never  Vaping Use   Vaping status: Never Used   Substance and Sexual Activity   Alcohol use: Yes    Alcohol/week: 1.0 - 2.0 standard drink of alcohol    Types: 1 - 2 Glasses of wine per week    Comment: 1 to  3 glasses wine per week   Drug use: No   Sexual activity: Not Currently    Birth control/protection: Post-menopausal  Other Topics Concern   Not on  file  Social History Narrative   Not on file   Social Determinants of Health   Financial Resource Strain: Low Risk  (07/17/2022)   Overall Financial Resource Strain (CARDIA)    Difficulty of Paying Living Expenses: Not hard at all  Food Insecurity: No Food Insecurity (07/17/2022)   Hunger Vital Sign    Worried About Running Out of Food in the Last Year: Never true    Ran Out of Food in the Last Year: Never true  Transportation Needs: No Transportation Needs (07/17/2022)   PRAPARE - Administrator, Civil Service (Medical): No    Lack of Transportation (Non-Medical): No  Physical Activity: Sufficiently Active (07/17/2022)   Exercise Vital Sign    Days of Exercise per Week: 4 days    Minutes of Exercise per Session: 60 min  Stress: No Stress Concern Present (07/17/2022)   [employer] of Occupational Health - Occupational Stress Questionnaire    Feeling of Stress : Not at all  Social Connections: Moderately Integrated (04/09/2022)   Social Connection and Isolation Panel [NHANES]    Frequency of Communication with Friends and Family: More than three times a week    Frequency of Social Gatherings with Friends and Family: Three times a week    Attends Religious Services: More than 4 times per year    Active Member of Clubs or Organizations: Yes    Attends Banker Meetings: More than 4 times per year    Marital Status: Widowed  Intimate Partner Violence: Not At Risk (07/04/2021)   Humiliation, Afraid, Rape, and Kick questionnaire    Fear of Current or Ex-Partner: No    Emotionally Abused: No    Physically Abused: No    Sexually Abused: No    Past  Medical History, Surgical history, Social history, and Family history were reviewed and updated as appropriate.   Please see review of systems for further details on the patient's review from today.   Objective:   Physical Exam:  LMP 08/29/2002 (Approximate)   Physical Exam Neurological:     Mental Status: She is alert and oriented to person, place, and time.     Cranial Nerves: No dysarthria.  Psychiatric:        Attention and Perception: Attention and perception normal.        Mood and Affect: Mood normal.        Speech: Speech normal.        Behavior: Behavior is cooperative.        Thought Content: Thought content normal. Thought content is not paranoid or delusional. Thought content does not include homicidal or suicidal ideation. Thought content does not include homicidal or suicidal plan.        Cognition and Memory: Cognition and memory normal.        Judgment: Judgment normal.     Comments: Insight intact     Lab Review:     Component Value Date/Time   NA 139 06/07/2023 0850   NA 139 09/08/2019 1045   K 3.8 06/07/2023 0850   CL 103 06/07/2023 0850   CO2 29 06/07/2023 0850   GLUCOSE 105 (H) 06/07/2023 0850   BUN 13 06/07/2023 0850   BUN 11 09/08/2019 1045   CREATININE 1.05 06/07/2023 0850   CREATININE 0.87 07/16/2016 1455   CALCIUM 9.4 06/07/2023 0850   PROT 6.6 06/07/2023 0850   PROT 6.6 09/08/2019 1045   ALBUMIN 4.1 06/07/2023 0850   ALBUMIN 4.2 09/08/2019 1045  AST 16 06/07/2023 0850   ALT 12 06/07/2023 0850   ALKPHOS 68 06/07/2023 0850   BILITOT 0.5 06/07/2023 0850   BILITOT 0.4 09/08/2019 1045   GFRNONAA 62 09/08/2019 1045   GFRAA 72 09/08/2019 1045       Component Value Date/Time   WBC 7.0 06/07/2023 0850   RBC 4.80 06/07/2023 0850   HGB 13.3 06/07/2023 0850   HGB 13.7 09/08/2019 1045   HGB 13.7 02/11/2014 1304   HCT 41.5 06/07/2023 0850   HCT 41.9 09/08/2019 1045   PLT 317.0 06/07/2023 0850   PLT 340 09/08/2019 1045   MCV 86.5 06/07/2023  0850   MCV 85 09/08/2019 1045   MCH 27.7 09/08/2019 1045   MCH 28.1 07/16/2016 1455   MCHC 32.1 06/07/2023 0850   RDW 13.6 06/07/2023 0850   RDW 13.1 09/08/2019 1045   LYMPHSABS 2.6 06/07/2023 0850   MONOABS 0.5 06/07/2023 0850   EOSABS 0.3 06/07/2023 0850   BASOSABS 0.1 06/07/2023 0850    No results found for: "POCLITH", "LITHIUM"   No results found for: "PHENYTOIN", "PHENOBARB", "VALPROATE", "CBMZ"   .res Assessment: Plan:   36 minutes spent dedicated to the care of this patient on the date of this encounter to include pre-visit review of records, ordering of medication, post visit documentation, and face-to-face time with the patient discussing treatment options for insomnia to include increasing dose of Belsomra to 20 mg at bedtime since she reports a partial response to Belsomra 15 mg at bedtime. Pt is in agreement with plan to increase dose of Belsomra. Will increase Belsomra to 20 mg at bedtime to improve insomnia. Discussed that Trazodone prn could be taken in combination with Belsomra if needed.  Continue Cymbalta 90 mg daily for depression and anxiety.  Continue Wellbutrin XL 150 mg daily for depression.  Pt to follow-up in 6 months or sooner if clinically indicated.  Patient advised to contact office with any questions, adverse effects, or acute worsening in signs and symptoms.   Brittini was seen today for insomnia and follow-up.  Diagnoses and all orders for this visit:  Major depressive disorder, recurrent episode, in full remission (HCC) -     buPROPion (WELLBUTRIN XL) 150 MG 24 hr tablet; Take 1 tablet (150 mg total) by mouth daily. -     DULoxetine (CYMBALTA) 60 MG capsule; TAKE 1 CAPSULE BY MOUTH DAILY WITH 30MG  CAPSULE TO EQUAL TOTAL DAILY DOSE OF 90MG  -     DULoxetine (CYMBALTA) 30 MG capsule; TAKE 1 CAPSULES BY MOUTH DAILY WITH 60MG  CAPSULE TO EQUAL TOTAL DAILY DOSE OF 90MG   Primary insomnia -     Suvorexant (BELSOMRA) 20 MG TABS; Take 1 tablet (20 mg total) by  mouth at bedtime.     Please see After Visit Summary for patient specific instructions.  No future appointments.   No orders of the defined types were placed in this encounter.     -------------------------------

## 2023-09-11 ENCOUNTER — Encounter: Payer: Self-pay | Admitting: Psychiatry

## 2023-09-19 DIAGNOSIS — H04123 Dry eye syndrome of bilateral lacrimal glands: Secondary | ICD-10-CM | POA: Diagnosis not present

## 2023-10-11 ENCOUNTER — Other Ambulatory Visit: Payer: Self-pay | Admitting: Obstetrics and Gynecology

## 2023-10-11 DIAGNOSIS — N8501 Benign endometrial hyperplasia: Secondary | ICD-10-CM

## 2023-10-11 NOTE — Telephone Encounter (Signed)
Med refill request: medroxyprogesterone 10mg    Last AEX: 02/16/21 Last OV 08/13/23 EMB Next AEX: none scheduled Last MMG (if hormonal med) 05/21/23 BI-RADS 1 negative Refill sent to provider for approval or denial as appropriate.

## 2023-10-14 ENCOUNTER — Other Ambulatory Visit: Payer: Self-pay | Admitting: Obstetrics and Gynecology

## 2023-10-14 NOTE — Telephone Encounter (Signed)
Rx for Provera refilled.   Patient needs appointment with me for breast and pelvic exam and also a follow up pelvic ultrasound for April, 2025.

## 2023-10-18 NOTE — Telephone Encounter (Signed)
Spoke with patient, advised per Dr. Edward Jolly.   B&P exam scheduled for 02/18/24 at 0930.   PUS scheduled for 4/24 at 0930, consult to follow at 1000 with Dr. Edward Jolly.   Routing to provider for final review. Patient is agreeable to disposition. Will close encounter.

## 2023-11-14 DIAGNOSIS — H04123 Dry eye syndrome of bilateral lacrimal glands: Secondary | ICD-10-CM | POA: Diagnosis not present

## 2023-12-07 ENCOUNTER — Other Ambulatory Visit: Payer: Self-pay | Admitting: Family Medicine

## 2024-01-07 ENCOUNTER — Encounter: Payer: Self-pay | Admitting: Obstetrics and Gynecology

## 2024-02-11 NOTE — Progress Notes (Signed)
 73 y.o. [id number] Widowed Caucasian female here for a breast and pelvic exam.  Patient states she is having trouble with vaginal dryness and painful intercourse.   The patient is also followed for simple endometrial hyperplasia.  Last EMB done 08/13/23  showed benign ectocervical mucosa and mucoid hemorrhagic debris with possible fragment of superficial endometrium, no malignancy.   She has continued Provera  10 mg daily with plan for follow ultrasound, which is scheduled for this week.    She has had light brown discharge, and it is less than it used to be.   May get married.  She is not able to have penetration.  Her partner has had testing for STDs and they are negative.   PCP: Marquetta Sit, MD   Patient's last menstrual period was 08/29/2002 (approximate).           Sexually active: Yes.    The current method of family planning is post menopausal status.    Menopausal hormone therapy: provera  Exercising: Yes.     Walking Smoker: no  OB History     Gravida  1   Para  0   Term  0   Preterm  0   AB  1   Living  0      SAB  0   IAB  1   Ectopic  0   Multiple  0   Live Births  0           HEALTH MAINTENANCE: Last 2 paps: 2015-WNL, HPV- neg, 2018-WNL History of abnormal Pap or positive HPV: no Mammogram: 05/21/2023-WNL, Cat B Colonoscopy: 01/12/2019, Due 12/2023?  Bone Density: 08/15/2023  Result osteopenia (FRAX 10-yr fracture risk - 22.7%, Hip 11.6%)   Immunization History  Administered Date(s) Administered   Fluad Quad(high Dose 65+) 07/11/2022   Influenza Split 08/16/2014   Influenza, High Dose Seasonal PF 08/25/2015, 08/17/2019   Influenza-Unspecified 08/15/2016, 08/15/2017, 08/18/2021, 08/22/2023   PFIZER(Purple Top)SARS-COV-2 Vaccination 11/24/2019, 12/15/2019, 08/02/2020   Pneumococcal Conjugate,unspecified 12/17/2016   Pneumococcal Conjugate-13 12/24/2014   Pneumococcal Polysaccharide-23 12/17/2016   Pneumococcal-Unspecified  12/17/2016   Td 04/01/2003   Tdap 02/11/2014   Zoster Recombinant(Shingrix) 02/09/2017, 04/11/2017   Zoster, Live 05/10/2010      reports that she has never smoked. She has never used smokeless tobacco. She reports current alcohol use of about 1.0 - 2.0 standard drink of alcohol per week. She reports that she does not use drugs.  Past Medical History:  Diagnosis Date   Arthritis    Cancer (HCC)    right leg, upper thigh AND CALF melanoma   Depression    Family history of adverse reaction to anesthesia    older sisters x 2 with ponv   GERD (gastroesophageal reflux disease)    Hypertension    Patient denies   Melanoma (HCC)    Obstructive sleep apnea    uses cpap some nights set on 17 per cpap titration report 04-18-2020 epic   Osteopenia    PMB (postmenopausal bleeding)    Pre-diabetes    UTI (lower urinary tract infection)    Wears glasses     Past Surgical History:  Procedure Laterality Date   BREAST BIOPSY Left    several yrs ago   COLONOSCOPY  2020   DILATATION & CURETTAGE/HYSTEROSCOPY WITH MYOSURE N/A 06/05/2022   Procedure: DILATATION & CURETTAGE/HYSTEROSCOPY WITH MYOSURE;  Surgeon: Wanita Gutta, MD;  Location: [facility];  Service: Gynecology;  Laterality: N/A;   MELANOMA EXCISION  2006   right thigh and calf   pre melamona area removed  10/2021   REFRACTIVE SURGERY     squamous cell area removed  11/2021   upper left leg   TONSILLECTOMY     as child   WISDOM TOOTH EXTRACTION     many yrs ago    Current Outpatient Medications  Medication Sig Dispense Refill   amLODipine  (NORVASC ) 2.5 MG tablet TAKE 1 TABLET BY MOUTH EVERY DAY 90 tablet 2   aspirin  EC 81 MG tablet Take 1 tablet (81 mg total) by mouth daily. Swallow whole. 90 tablet 3   buPROPion  (WELLBUTRIN  XL) 150 MG 24 hr tablet Take 1 tablet (150 mg total) by mouth daily. 90 tablet 1   Co-Enzyme Q10 100 MG CAPS Take 1 capsule by mouth daily.     DULoxetine  (CYMBALTA ) 30 MG capsule  TAKE 1 CAPSULES BY MOUTH DAILY WITH 60MG  CAPSULE TO EQUAL TOTAL DAILY DOSE OF 90MG  90 capsule 1   DULoxetine  (CYMBALTA ) 60 MG capsule TAKE 1 CAPSULE BY MOUTH DAILY WITH 30MG  CAPSULE TO EQUAL TOTAL DAILY DOSE OF 90MG  90 capsule 1   losartan -hydrochlorothiazide (HYZAAR) 100-12.5 MG tablet TAKE 1 TABLET BY MOUTH EVERY DAY *SCHEDULE APPT FOR FUTURE REFILLS* 90 tablet 1   medroxyPROGESTERone  (PROVERA ) 10 MG tablet TAKE 1 TABLET BY MOUTH EVERY DAY 90 tablet 1   Multiple Vitamin (MULTIVITAMIN) capsule Take 1 capsule by mouth daily.     Omega-3 Fatty Acids (FISH OIL PO) Take by mouth daily.     RESTASIS 0.05 % ophthalmic emulsion 1 drop 2 (two) times daily.     traZODone  (DESYREL ) 50 MG tablet Take 50 mg by mouth at bedtime as needed for sleep.     Ginkgo Biloba 40 MG TABS      rosuvastatin  (CRESTOR ) 5 MG tablet Take 1 tablet (5 mg total) by mouth every other day. (Patient not taking: Reported on 09/09/2023) 45 tablet 3   No current facility-administered medications for this visit.    ALLERGIES: Keflex [cephalexin] and Sulfa antibiotics  Family History  Problem Relation Age of Onset   Cancer Mother        breast   Stroke Mother    Breast cancer Mother    Depression Mother    Alcohol abuse Father    Heart attack Father    Other Sister 61       colon mass, benign   Hypertension Sister    Colon cancer Sister 27   Depression Sister    Colon polyps Sister    Prostate cancer Brother    Other Sister 23       colon mass- not colon cancer   Hypertension Sister    Colon polyps Sister    Anxiety disorder Maternal Aunt    Esophageal cancer Neg Hx    Rectal cancer Neg Hx    Stomach cancer Neg Hx     Review of Systems  All other systems reviewed and are negative.   PHYSICAL EXAM:  BP 126/78 (BP Location: Left Arm, Patient Position: Sitting, Cuff Size: Normal)   Pulse 76   Ht 5' 5.5" (1.664 m)   Wt 160 lb (72.6 kg)   LMP 08/29/2002 (Approximate)   SpO2 98%   BMI 26.22 kg/m     General  appearance: alert, cooperative and appears stated age Head: normocephalic, without obvious abnormality, atraumatic Neck: no adenopathy, supple, symmetrical, trachea midline and thyroid  normal to inspection and palpation Lungs: clear to auscultation bilaterally Breasts: normal  appearance, no masses or tenderness, No nipple retraction or dimpling, No nipple discharge or bleeding, No axillary adenopathy Heart: regular rate and rhythm Abdomen: soft, non-tender; no masses, no organomegaly Extremities: extremities normal, atraumatic, no cyanosis or edema Skin: skin color, texture, turgor normal. No rashes or lesions Lymph nodes: cervical, supraclavicular, and axillary nodes normal. Neurologic: grossly normal  Pelvic: External genitalia:  no lesions              No abnormal inguinal nodes palpated.              Urethra:  normal appearing urethra with no masses, tenderness or lesions              Bartholins and Skenes: normal                 Vagina: normal appearing vagina with normal color and discharge, no lesions              Cervix: no lesions              Pap taken: yes Bimanual Exam:  Uterus:  normal size, contour, position, consistency, mobility, non-tender              Adnexa: no mass, fullness, tenderness              Rectal exam: yes.  Confirms.              Anus:  normal sphincter tone, no lesions  Chaperone was present for exam:   Cottie Diss, CMA  ASSESSMENT: Encounter for breast and pelvic exam.  Personal history not otherwise specified.  Hx simple endometrial hyperplasia.  Encounter for medication monitoring.  Cervical cancer screening.  Vaginal atrophy. Vaginal discharge. STD screening.  Family history of colon cancer.   PLAN: Mammogram screening discussed. Self breast awareness reviewed. Pap and HRV collected:  yes.  STD screening.  Vaginitis testing.  Guidelines for Calcium , Vitamin D , regular exercise program including cardiovascular and weight bearing  exercise. Medication refills:  Provera  10 mg daily, #90, RF none.  We discussed lubricants, vag vit E, vaginal estrogen, Intrarosa.   Will do nonprescription medication at this time. Consider vaginal dilators.  We discussed alternatives to this.  Return for pelvic US  this week.  May need follow up endometrial biopsy done.  Due for colonoscopy this year.  Follow up:  yearly and prn.   Additional counseling given.  yes. 35 min  total time was spent for this patient encounter, including preparation, face-to-face counseling with the patient, coordination of care, and documentation of the encounter in addition to doing the breast and pelvic exam and pap.

## 2024-02-13 NOTE — Progress Notes (Signed)
 GYNECOLOGY  VISIT   HPI: 73 y.o.   Widowed  Caucasian female   [id number] with Patient's last menstrual period was 08/29/2002 (approximate).   here for: u/s consult for follow up of simple endometrial hyperplasia.   Patient takes Provera  10 mg daily.   Her last EMB in August 13, 2023 showed benign superficial endometrial without glands.   She is planning for potential marriage and would like to have sexual functioning.  GYNECOLOGIC HISTORY: Patient's last menstrual period was 08/29/2002 (approximate). 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/04/2021   Prediabetes    Major depressive disorder, recurrent episode, mild (HCC) 08/24/2018   Major depressive disorder, recurrent episode, in full remission (HCC) 07/14/2018   Pain in joint of right elbow 12/04/2017   Osteopenia 02/11/2014   Benign paroxysmal positional vertigo 02/11/2014   Melanoma (HCC) 03/31/2012   History of depression 03/31/2012   Essential hypertension 03/31/2012    Past Medical History:  Diagnosis Date   Arthritis    Cancer (HCC)    right leg, upper thigh AND CALF melanoma   Depression    Family history of adverse reaction to anesthesia    older sisters x 2 with ponv   GERD (gastroesophageal reflux disease)    Hypertension    Patient denies   Melanoma (HCC)    Obstructive sleep apnea    uses cpap some nights set on 17 per cpap titration report 04-18-2020 epic   Osteopenia    PMB (postmenopausal bleeding)    Pre-diabetes    UTI (lower urinary  tract infection)    Wears glasses     Past Surgical History:  Procedure Laterality Date   BREAST BIOPSY Left    several yrs ago   COLONOSCOPY  2020   DILATATION & CURETTAGE/HYSTEROSCOPY WITH MYOSURE N/A 06/05/2022   Procedure: DILATATION & CURETTAGE/HYSTEROSCOPY WITH MYOSURE;  Surgeon: Wanita Gutta, MD;  Location: [facility];  Service: Gynecology;  Laterality: N/A;   MELANOMA EXCISION  2006   right thigh and calf   pre melamona area removed  10/2021   REFRACTIVE SURGERY     squamous cell area removed  11/2021   upper left leg   TONSILLECTOMY     as child   WISDOM TOOTH EXTRACTION     many yrs ago    Current Outpatient Medications  Medication Sig Dispense Refill   amLODipine  (NORVASC ) 2.5 MG tablet TAKE 1 TABLET BY MOUTH EVERY DAY 90 tablet 2   aspirin  EC 81 MG tablet Take 1 tablet (81 mg total) by mouth daily. Swallow whole. 90 tablet 3   buPROPion  (WELLBUTRIN  XL) 150 MG 24 hr tablet Take 1 tablet (150 mg total) by mouth daily. 90 tablet 1   Co-Enzyme Q10  100 MG CAPS Take 1 capsule by mouth daily.     DULoxetine  (CYMBALTA ) 30 MG capsule TAKE 1 CAPSULES BY MOUTH DAILY WITH 60MG  CAPSULE TO EQUAL TOTAL DAILY DOSE OF 90MG  90 capsule 1   DULoxetine  (CYMBALTA ) 60 MG capsule TAKE 1 CAPSULE BY MOUTH DAILY WITH 30MG  CAPSULE TO EQUAL TOTAL DAILY DOSE OF 90MG  90 capsule 1   Ginkgo Biloba 40 MG TABS      losartan -hydrochlorothiazide (HYZAAR) 100-12.5 MG tablet TAKE 1 TABLET BY MOUTH EVERY DAY *SCHEDULE APPT FOR FUTURE REFILLS* 90 tablet 1   medroxyPROGESTERone  (PROVERA ) 10 MG tablet Take 1 tablet (10 mg total) by mouth daily. 90 tablet 0   Multiple Vitamin (MULTIVITAMIN) capsule Take 1 capsule by mouth daily.     Omega-3 Fatty Acids (FISH OIL PO) Take by mouth daily.     RESTASIS 0.05 % ophthalmic emulsion 1 drop 2 (two) times daily.     traZODone  (DESYREL ) 50 MG tablet Take 50 mg by mouth at bedtime as needed for sleep.     rosuvastatin  (CRESTOR ) 5 MG tablet Take 1  tablet (5 mg total) by mouth every other day. (Patient not taking: Reported on 09/09/2023) 45 tablet 3   No current facility-administered medications for this visit.     ALLERGIES: Keflex [cephalexin] and Sulfa antibiotics  Family History  Problem Relation Age of Onset   Cancer Mother        breast   Stroke Mother    Breast cancer Mother    Depression Mother    Alcohol abuse Father    Heart attack Father    Other Sister 24       colon mass, benign   Hypertension Sister    Colon cancer Sister 35   Depression Sister    Colon polyps Sister    Prostate cancer Brother    Other Sister 76       colon mass- not colon cancer   Hypertension Sister    Colon polyps Sister    Anxiety disorder Maternal Aunt    Esophageal cancer Neg Hx    Rectal cancer Neg Hx    Stomach cancer Neg Hx     Social History   Socioeconomic History   Marital status: Widowed    Spouse name: Not on file   Number of children: 0   Years of education: Not on file   Highest education level: Bachelor's degree (e.g., BA, AB, BS)  Occupational History   Occupation: Retired Engineer, site and [employer]  Tobacco Use   Smoking status: Never   Smokeless tobacco: Never  Vaping Use   Vaping status: Never Used  Substance and Sexual Activity   Alcohol use: Yes    Alcohol/week: 1.0 - 2.0 standard drink of alcohol    Types: 1 - 2 Glasses of wine per week    Comment: 1 to  3 glasses wine per week   Drug use: No   Sexual activity: Yes    Birth control/protection: Post-menopausal    Comment: more than 5, after 14, no STD, no abnormal pap, no DES,  Other Topics Concern   Not on file  Social History Narrative   Not on file   Social Drivers of Health   Financial Resource Strain: Low Risk  (07/17/2022)   Overall Financial Resource Strain (CARDIA)    Difficulty of Paying Living Expenses: Not hard at all  Food Insecurity: No Food Insecurity (07/17/2022)   Hunger Vital Sign    Worried About Running Out  of  Food in the Last Year: Never true    Ran Out of Food in the Last Year: Never true  Transportation Needs: No Transportation Needs (07/17/2022)   PRAPARE - Administrator, Civil Service (Medical): No    Lack of Transportation (Non-Medical): No  Physical Activity: Sufficiently Active (07/17/2022)   Exercise Vital Sign    Days of Exercise per Week: 4 days    Minutes of Exercise per Session: 60 min  Stress: No Stress Concern Present (07/17/2022)   [employer] of Occupational Health - Occupational Stress Questionnaire    Feeling of Stress : Not at all  Social Connections: Moderately Integrated (04/09/2022)   Social Connection and Isolation Panel [NHANES]    Frequency of Communication with Friends and Family: More than three times a week    Frequency of Social Gatherings with Friends and Family: Three times a week    Attends Religious Services: More than 4 times per year    Active Member of Clubs or Organizations: Yes    Attends Banker Meetings: More than 4 times per year    Marital Status: Widowed  Intimate Partner Violence: Not At Risk (07/04/2021)   Humiliation, Afraid, Rape, and Kick questionnaire    Fear of Current or Ex-Partner: No    Emotionally Abused: No    Physically Abused: No    Sexually Abused: No    Review of Systems  All other systems reviewed and are negative.   PHYSICAL EXAMINATION:   BP 130/84 (BP Location: Left Arm, Patient Position: Sitting, Cuff Size: Normal)   Pulse 68   Ht 5' 5.5" (1.664 m)   Wt 160 lb (72.6 kg)   LMP 08/29/2002 (Approximate)   SpO2 96%   BMI 26.22 kg/m     General appearance: alert, cooperative and appears stated age  Pelvic US  Uterus 4.32 x 2.80 x 2.11 cm.  No myometrial masses.  EMS 1.44 mm with sliver of fluid at fundus. Left ovary 1.16 x 0.81 x 0.87 cm.  Normal. Right ovary 1.34 x 0.57 x 0.65 cm.  Normal.  No adnexal masses.  No free fluid.   ASSESSMENT:  Simple endometrial hyperplasia.  On Provera .   Vaginal atrophy.   PLAN:  Us  images and report reviewed.  Return for endometrial biopsy with paracervical block.  Ibuprofen 800 mg prior to appt.  We reviewed vaginal estrogen cream and Intrarosa as tx options for atrophy.   Written information given.  Would consider pelvic US  6 months later to recheck her endometrium if she comes off the Provera .    23 min  total time was spent for this patient encounter, including preparation, face-to-face counseling with the patient, coordination of care, and documentation of the encounter.

## 2024-02-18 ENCOUNTER — Other Ambulatory Visit (HOSPITAL_COMMUNITY)
Admission: RE | Admit: 2024-02-18 | Discharge: 2024-02-18 | Disposition: A | Discharge status: Home / Self Care | Source: Ambulatory Visit | Attending: Obstetrics and Gynecology | Admitting: Obstetrics and Gynecology

## 2024-02-18 ENCOUNTER — Encounter: Payer: Self-pay | Admitting: Obstetrics and Gynecology

## 2024-02-18 ENCOUNTER — Ambulatory Visit (INDEPENDENT_AMBULATORY_CARE_PROVIDER_SITE_OTHER): Payer: Medicare Other | Admitting: Obstetrics and Gynecology

## 2024-02-18 VITALS — BP 126/78 | HR 76 | Ht 65.5 in | Wt 160.0 lb

## 2024-02-18 DIAGNOSIS — Z114 Encounter for screening for human immunodeficiency virus [HIV]: Secondary | ICD-10-CM

## 2024-02-18 DIAGNOSIS — N898 Other specified noninflammatory disorders of vagina: Secondary | ICD-10-CM | POA: Insufficient documentation

## 2024-02-18 DIAGNOSIS — Z1159 Encounter for screening for other viral diseases: Secondary | ICD-10-CM | POA: Diagnosis not present

## 2024-02-18 DIAGNOSIS — N8501 Benign endometrial hyperplasia: Secondary | ICD-10-CM

## 2024-02-18 DIAGNOSIS — Z124 Encounter for screening for malignant neoplasm of cervix: Secondary | ICD-10-CM

## 2024-02-18 DIAGNOSIS — Z9189 Other specified personal risk factors, not elsewhere classified: Secondary | ICD-10-CM

## 2024-02-18 DIAGNOSIS — Z113 Encounter for screening for infections with a predominantly sexual mode of transmission: Secondary | ICD-10-CM

## 2024-02-18 DIAGNOSIS — Z01411 Encounter for gynecological examination (general) (routine) with abnormal findings: Secondary | ICD-10-CM | POA: Insufficient documentation

## 2024-02-18 DIAGNOSIS — Z5181 Encounter for therapeutic drug level monitoring: Secondary | ICD-10-CM | POA: Diagnosis not present

## 2024-02-18 DIAGNOSIS — Z1151 Encounter for screening for human papillomavirus (HPV): Secondary | ICD-10-CM | POA: Insufficient documentation

## 2024-02-18 DIAGNOSIS — Z01419 Encounter for gynecological examination (general) (routine) without abnormal findings: Secondary | ICD-10-CM

## 2024-02-18 MED ORDER — MEDROXYPROGESTERONE ACETATE 10 MG PO TABS: 10.0000 mg | ORAL_TABLET | Freq: Every day | ORAL | 0 refills | Status: DC

## 2024-02-18 NOTE — Patient Instructions (Signed)

## 2024-02-19 ENCOUNTER — Encounter: Payer: Self-pay | Admitting: Obstetrics and Gynecology

## 2024-02-19 LAB — CERVICOVAGINAL ANCILLARY ONLY
Bacterial Vaginitis (gardnerella): NEGATIVE
Candida Glabrata: NEGATIVE
Candida Vaginitis: NEGATIVE
Comment: NEGATIVE
Comment: NEGATIVE
Comment: NEGATIVE

## 2024-02-19 LAB — HEPATITIS C ANTIBODY: Hepatitis C Ab: NONREACTIVE

## 2024-02-19 LAB — RPR: RPR Ser Ql: NONREACTIVE

## 2024-02-19 LAB — HIV ANTIBODY (ROUTINE TESTING W REFLEX): HIV 1&2 Ab, 4th Generation: NONREACTIVE

## 2024-02-20 ENCOUNTER — Ambulatory Visit: Payer: Medicare Other | Admitting: Obstetrics and Gynecology

## 2024-02-20 ENCOUNTER — Encounter: Payer: Self-pay | Admitting: Obstetrics and Gynecology

## 2024-02-20 ENCOUNTER — Ambulatory Visit: Payer: Medicare Other

## 2024-02-20 VITALS — BP 130/84 | HR 68 | Ht 65.5 in | Wt 160.0 lb

## 2024-02-20 DIAGNOSIS — N8501 Benign endometrial hyperplasia: Secondary | ICD-10-CM

## 2024-02-20 LAB — CYTOLOGY - PAP
Chlamydia: NEGATIVE
Comment: NEGATIVE
Comment: NEGATIVE
Comment: NEGATIVE
Comment: NORMAL
Diagnosis: NEGATIVE
High risk HPV: NEGATIVE
Neisseria Gonorrhea: NEGATIVE
Trichomonas: NEGATIVE

## 2024-02-20 NOTE — Patient Instructions (Addendum)
 Prasterone Vaginal insert What is this medication? PRASTERONE (PRAS ter one), also known as DEHYDROEPIANDROSTERONE (DHEA) reduces vaginal pain during sex due to menopause. It works by increasing levels of the hormone estrogen in the body. This medicine may be used for other purposes; ask your health care provider or pharmacist if you have questions. COMMON BRAND NAME(S): INTRAROSA What should I tell my care team before I take this medication? They need to know if you have any of these conditions: Cancer, such as breast, uterine, or other cancer History of vaginal bleeding An unusual or allergic reaction to prasterone, DHEA, other hormones, medications, foods, dyes, or preservatives Pregnant or trying to get pregnant Breast-feeding How should I use this medication? This medication is for vaginal use only. Do not take by mouth. Follow the directions on the prescription label. Read package directions carefully before using. Wash hands before and after use. Use this medication at bedtime. Do not use it more often than directed. Do not stop using except on your care team's advice. Talk to your care team about the use of this medication in children. This medication is not approved for use in children. Overdosage: If you think you have taken too much of this medicine contact a poison control center or emergency room at once. NOTE: This medicine is only for you. Do not share this medicine with others. What if I miss a dose? If you miss a dose, use it as soon as you can. If it is almost time for your next dose, use only that dose. Do not use double or extra doses. What may interact with this medication? Interactions are not expected. Do not use any other vaginal products without telling your care team. This list may not describe all possible interactions. Give your health care provider a list of all the medicines, herbs, non-prescription drugs, or dietary supplements you use. Also tell them if you smoke,  drink alcohol, or use illegal drugs. Some items may interact with your medicine. What should I watch for while using this medication? Visit your care team for a regular check on your progress. This medication may cause changes on a cervical Pap smear. You will receive regular pelvic exams. What side effects may I notice from receiving this medication? Side effects that you should report to your care team as soon as possible: Allergic reactions--skin rash, itching, hives, swelling of the face, lips, tongue, or throat Unusual vaginal discharge, itching, or odor Vaginal bleeding after menopause, pelvic pain Side effects that usually do not require medical attention (report to your care team if they continue or are bothersome): Vaginal discharge Vaginal irritation at application site This list may not describe all possible side effects. Call your doctor for medical advice about side effects. You may report side effects to FDA at 1-800-FDA-1088. Where should I keep my medication? Keep out of the reach of children and pets. Store at room temperature or in a refrigerator between 5 and 30 degrees C (41 and 86 degrees F). Throw away any unused medication after the expiration date. NOTE: This sheet is a summary. It may not cover all possible information. If you have questions about this medicine, talk to your doctor, pharmacist, or health care provider.  2024 Elsevier/Gold Standard (2021-09-06 00:00:00)  Conjugated Estrogens Vaginal cream What is this medication? CONJUGATED ESTROGENS (CON ju gate ed ESS troe jenz) relieves the symptoms of menopause, such as vaginal irritation, dryness, or pain during sex. It works by increasing levels of the hormone estrogen in  the body. This medication is an estrogen hormone. This medicine may be used for other purposes; ask your health care provider or pharmacist if you have questions. COMMON BRAND NAME(S): Premarin What should I tell my care team before I take this  medication? They need to know if you have any of these conditions: Blood vessel disease or blood clots Breast, cervical, endometrial, or uterine cancer Dementia Diabetes Gallbladder disease Heart disease or recent heart attack High blood pressure High cholesterol High level of calcium  in the blood Hysterectomy Kidney disease Liver disease Lupus Migraine headaches Protein C deficiency Protein S deficiency Stroke Tobacco use Unusual vaginal bleeding An unusual or allergic reaction to estrogens, other medications, foods, dyes, or preservatives Pregnant or trying to get pregnant Breastfeeding How should I use this medication? This medication is for use in the vagina only. Do not take by mouth. Follow the directions on the prescription label. Use at bedtime unless otherwise directed by your care team. Use the special applicator supplied with the cream. Wash hands before and after use. Fill the applicator with the cream and remove from the tube. Lie on your back, part and bend your knees. Insert the applicator into the vagina and push the plunger to expel the cream into the vagina. Wash the applicator with warm soapy water and rinse well. Use exactly as directed for the complete length of time prescribed. Do not stop using except on the advice of your care team. Talk to your care team about the use of this medication in children. Special care may be needed. A patient package insert for the product will be given with each prescription and refill. Read this sheet carefully each time. The sheet may change frequently. Overdosage: If you think you have taken too much of this medicine contact a poison control center or emergency room at once. NOTE: This medicine is only for you. Do not share this medicine with others. What if I miss a dose? If you miss a dose, use it as soon as you can. If it is almost time for your next dose, use only that dose. Do not use double or extra doses. What may  interact with this medication? Do not take this medication with any of the following: Aromatase inhibitors, such as aminoglutethimide, anastrozole, exemestane, letrozole, testolactone This medication may also interact with the following: Barbiturates used for inducing sleep or treating seizures Carbamazepine Certain antibiotics used to treat infections Grapefruit juice Medications for fungal infections, such as itraconazole and ketoconazole Raloxifene or tamoxifen Rifabutin Rifampin Rifapentine Ritonavir St. John's Wort Warfarin This list may not describe all possible interactions. Give your health care provider a list of all the medicines, herbs, non-prescription drugs, or dietary supplements you use. Also tell them if you smoke, drink alcohol, or use illegal drugs. Some items may interact with your medicine. What should I watch for while using this medication? Visit your care team for regular checks on your progress. You will need a regular breast and pelvic exam. You should also discuss the need for regular mammograms with your care team, and follow their guidelines. This medication can make your body retain fluid, making your fingers, hands, or ankles swell. Your blood pressure can go up. Contact your care team if you feel you are retaining fluid. If you have any reason to think you are pregnant; stop taking this medication at once and contact your care team. Tobacco smoking increases the risk of getting a blood clot or having a stroke, especially if you  are more than 75 years old. You are strongly advised not to smoke. If you wear contact lenses and notice visual changes, or if the lenses begin to feel uncomfortable, consult your care team. If you are going to have elective surgery, you may need to stop taking this medication beforehand. Consult your care team for advice prior to scheduling the surgery. What side effects may I notice from receiving this medication? Side effects that you  should report to your care team as soon as possible: Allergic reactions or angioedema--skin rash, itching or hives, swelling of the face, eyes, lips, tongue, arms, or legs, trouble swallowing or breathing Blood clot--pain, swelling, or warmth in the leg, shortness of breath, chest pain Breast tissue changes, new lumps, redness, pain, or discharge from the nipple Gallbladder problems--severe stomach pain, nausea, vomiting, fever Heart attack--pain or tightness in the chest, shoulders, arms, or jaw, nausea, shortness of breath, cold or clammy skin, feeling faint or lightheaded Heavy vaginal bleeding Increase in blood pressure Stroke--sudden numbness or weakness of the face, arm, or leg, trouble speaking, confusion, trouble walking, loss of balance or coordination, dizziness, severe headache, change in vision Sudden eye pain or change in vision such as blurry vision, seeing halos around lights, vision loss Unusual vaginal discharge, itching, or odor Side effects that usually do not require medical attention (report to your care team if they continue or are bothersome): Bloating Breast pain or tenderness Dark patches of skin on the face or other sun-exposed areas Hair loss Irregular menstrual cycles or spotting Nausea Stomach pain Swelling of the ankles, hands, or feet This list may not describe all possible side effects. Call your doctor for medical advice about side effects. You may report side effects to FDA at 1-800-FDA-1088. Where should I keep my medication? Keep out of the reach of children and pets. Store at room temperature between 15 and 30 degrees C (59 and 86 degrees F). Throw away any unused medication after the expiration date. NOTE: This sheet is a summary. It may not cover all possible information. If you have questions about this medicine, talk to your doctor, pharmacist, or health care provider.  2024 Elsevier/Gold Standard (2023-04-17 00:00:00)  Endometrial Biopsy  An  endometrial biopsy is a procedure where a tissue sample is removed from the lining of the uterus. This lining is called the endometrium. The tissue sample is then sent to a lab for testing. You may have this type of biopsy to check for: Cancer. Infection. Growths called polyps. Uterine bleeding that can't be explained. Tell a health care provider about: Any allergies you have. All medicines you're taking including vitamins, herbs, eye drops, creams, and over-the-counter medicines. Any problems you or family members have had with anesthesia. Any bleeding problems you have. Any surgeries you have had. Any medical problems you have. Whether you're pregnant or may be pregnant. What are the risks? Your health care provider will talk with you about risks. These may include: Bleeding. Infection. Allergic reactions to medicines. Damage to the wall of the uterus. This is rare. What happens before the procedure? Keep track of your period. You may need to have this biopsy when you're not having your period. Ask your provider about: Changing or stopping your regular medicines. These include any diabetes medicines or blood thinners you take. Taking medicines such as aspirin  and ibuprofen. These medicines can thin your blood. Do not take them unless your provider tells you to. Taking over-the-counter medicines, vitamins, herbs, and supplements. Bring a pad with  you. You may need to wear one after the biopsy. Plan to have a responsible adult take you home from the hospital or clinic. You won't be allowed to drive. What happens during the procedure? A tool will be put into your vagina to hold it open. This helps your provider see the cervix. The cervix is the lowest part of the uterus. Your cervix will be cleaned with a solution that kills germs. You will be given anesthesia. This keeps you from feeling pain. It will numb your cervix. A tool called forceps will be used to hold your cervix steady. A  thin tool called a uterine sound will be put through your cervix. It will be used to: Find the length of your uterus. Find where to take the sample from. A soft tube called a catheter will be put into your uterus. The catheter will remove a tissue sample. The tube and tools will be removed. The sample will be sent to a lab for testing. The procedure may vary among providers and hospitals. What happens after the procedure? Your blood pressure, heart rate, breathing rate, and blood oxygen level will be monitored until you leave the hospital or clinic. It's up to you to get the results of your procedure. Ask your provider, or the department that is doing the procedure, when your results will be ready. This information is not intended to replace advice given to you by your health care provider. Make sure you discuss any questions you have with your health care provider. Document Revised: 12/25/2022 Document Reviewed: 12/25/2022 Elsevier Patient Education  2024 [employer].

## 2024-03-08 ENCOUNTER — Other Ambulatory Visit: Payer: Self-pay | Admitting: Family Medicine

## 2024-03-18 ENCOUNTER — Ambulatory Visit (INDEPENDENT_AMBULATORY_CARE_PROVIDER_SITE_OTHER): Admitting: Obstetrics and Gynecology

## 2024-03-18 ENCOUNTER — Encounter: Payer: Self-pay | Admitting: Obstetrics and Gynecology

## 2024-03-18 ENCOUNTER — Other Ambulatory Visit (HOSPITAL_COMMUNITY)
Admission: RE | Admit: 2024-03-18 | Discharge: 2024-03-18 | Disposition: A | Discharge status: Home / Self Care | Source: Ambulatory Visit | Attending: Obstetrics and Gynecology | Admitting: Obstetrics and Gynecology

## 2024-03-18 VITALS — BP 134/84 | HR 96

## 2024-03-18 DIAGNOSIS — N952 Postmenopausal atrophic vaginitis: Secondary | ICD-10-CM | POA: Diagnosis not present

## 2024-03-18 DIAGNOSIS — N8501 Benign endometrial hyperplasia: Secondary | ICD-10-CM | POA: Diagnosis present

## 2024-03-18 NOTE — Patient Instructions (Addendum)
 Estradiol  Vaginal insert What is this medication? ESTRADIOL  (es tra DYE ole) relieves the symptoms of menopause, such as vaginal irritation, dryness, or pain during sex. It works by increasing levels of the hormone estrogen in the body. It is an estrogen hormone. This medicine may be used for other purposes; ask your health care provider or pharmacist if you have questions. COMMON BRAND NAME(S): Imvexxy , Vagifem , Yuvafem  What should I tell my care team before I take this medication? They need to know if you have any of these conditions: Asthma Blood clotting disorder or history of blood clots Cancer, such as breast, cervical, or liver cancer Diabetes Gallbladder disease Having surgery Heart or blood vessel conditions Hereditary angioedema, a genetic condition that causes episodes of severe swelling High blood pressure High cholesterol High levels of calcium  in your blood History of heart attack History of stroke Hysterectomy Kidney disease Liver disease Lupus Migraine or other severe headaches Porphyria Seizures Thyroid  disease Tobacco use Unusual vaginal bleeding An unusual or allergic reaction to estrogens, other medications, foods, dyes, or preservatives Pregnant or trying to get pregnant Breastfeeding How should I use this medication? This medication is for vaginal use only. Do not take by mouth. Follow the directions that are included with your prescription. Wash hands before and after use. Keep using this medication unless your care team tells you to stop. This medication comes with INSTRUCTIONS FOR USE. Ask your pharmacist for directions on how to use this medication. Read the information carefully. Talk to your pharmacist or care team if you have questions. A patient package insert for the product will be given with each prescription and refill. Read this sheet carefully each time. The sheet may change frequently. Contact your care team about the use of this medication in  children. Special care may be needed. Overdosage: If you think you have taken too much of this medicine contact a poison control center or emergency room at once. NOTE: This medicine is only for you. Do not share this medicine with others. What if I miss a dose? If you miss a dose, use it as soon as you can. If it is almost time for your next dose, use only that dose. Do not use double or extra doses. What may interact with this medication? This medication may affect how other medications work, and other medications may affect the way this medication works. Talk with your care team about all of the medications you take. They may suggest changes to your treatment plan to lower the risk of side effects and to make sure your medications work as intended. This list may not describe all possible interactions. Give your health care provider a list of all the medicines, herbs, non-prescription drugs, or dietary supplements you use. Also tell them if you smoke, drink alcohol, or use illegal drugs. Some items may interact with your medicine. What should I watch for while using this medication? Visit your care team for regular checks on your progress. Tell your care team if your symptoms do not start to get better or if they get worse. Talk to your care team about how often you should have a pelvic exam, breast exam, and a mammogram. Talk to your care team about your risk of cancer. You may be more at risk for certain types of cancer if you take this medication. Talk to your care team right away if you have vaginal bleeding while on this medication. If you have a uterus, talk to your care team about whether  adding a progestin to your hormone therapy is right for you. Taking progestins with estrogen therapy may lower the risk of uterine cancer, but can have other health risks. Talk to your care team if you use tobacco products. Changes to your treatment plan may be needed. Tobacco increases the risk of getting a  blood clot or having a stroke while you are taking this medication. This risk is higher if you are 35 years or older. Tell your care team right away if you have any change in your eyesight. If you are going to need surgery or other procedure, tell your care team that you are using this medication. If you may be pregnant, stop taking this medication right away and contact your care team. What side effects may I notice from receiving this medication? Side effects that you should report to your care team as soon as possible: Allergic reactions or angioedema--skin rash, itching or hives, swelling of the face, eyes, lips, tongue, arms, or legs, trouble swallowing or breathing Blood clot--pain, swelling, or warmth in the leg, shortness of breath, chest pain Breast tissue changes, new lumps, redness, pain, or discharge from the nipple Change in vision Gallbladder problems--severe stomach pain, nausea, vomiting, fever Increase in blood pressure Liver injury--right upper belly pain, loss of appetite, nausea, light-colored stool, dark yellow or brown urine, yellowing skin or eyes, unusual weakness or fatigue Stroke--sudden numbness or weakness of the face, arm, or leg, trouble speaking, confusion, trouble walking, loss of balance or coordination, dizziness, severe headache, change in vision Unusual vaginal discharge, itching, or odor Vaginal bleeding after menopause, pelvic pain Side effects that usually do not require medical attention (report these to your care team if they continue or are bothersome): Bloating Breast pain or tenderness Hair loss Nausea Stomach pain Swelling of the ankles, hands, or feet Vaginal irritation at application site This list may not describe all possible side effects. Call your doctor for medical advice about side effects. You may report side effects to FDA at 1-800-FDA-1088. Where should I keep my medication? Keep out of the reach of children and pets. Store at room  temperature between 20 and 25 degrees C (68 and 77 degrees F). Get rid of any unused medication after the expiration date. To get rid of medications that are no longer needed or have expired: Take the medication to a medication take-back program. Check with your pharmacy or law enforcement to find a location. If you cannot return the medication, ask your pharmacist or care team how to get rid of this medication safely. NOTE: This sheet is a summary. It may not cover all possible information. If you have questions about this medicine, talk to your doctor, pharmacist, or health care provider.  2024 Elsevier/Gold Standard (2023-09-27 00:00:00)  Prasterone Vaginal insert What is this medication? PRASTERONE (PRAS ter one), also known as DEHYDROEPIANDROSTERONE (DHEA) reduces vaginal pain during sex due to menopause. It works by increasing levels of the hormone estrogen in the body. This medicine may be used for other purposes; ask your health care provider or pharmacist if you have questions. COMMON BRAND NAME(S): INTRAROSA What should I tell my care team before I take this medication? They need to know if you have any of these conditions: Cancer, such as breast, uterine, or other cancer History of vaginal bleeding An unusual or allergic reaction to prasterone, DHEA, other hormones, medications, foods, dyes, or preservatives Pregnant or trying to get pregnant Breast-feeding How should I use this medication? This medication is for  vaginal use only. Do not take by mouth. Follow the directions on the prescription label. Read package directions carefully before using. Wash hands before and after use. Use this medication at bedtime. Do not use it more often than directed. Do not stop using except on your care team's advice. Talk to your care team about the use of this medication in children. This medication is not approved for use in children. Overdosage: If you think you have taken too much of this  medicine contact a poison control center or emergency room at once. NOTE: This medicine is only for you. Do not share this medicine with others. What if I miss a dose? If you miss a dose, use it as soon as you can. If it is almost time for your next dose, use only that dose. Do not use double or extra doses. What may interact with this medication? Interactions are not expected. Do not use any other vaginal products without telling your care team. This list may not describe all possible interactions. Give your health care provider a list of all the medicines, herbs, non-prescription drugs, or dietary supplements you use. Also tell them if you smoke, drink alcohol, or use illegal drugs. Some items may interact with your medicine. What should I watch for while using this medication? Visit your care team for a regular check on your progress. This medication may cause changes on a cervical Pap smear. You will receive regular pelvic exams. What side effects may I notice from receiving this medication? Side effects that you should report to your care team as soon as possible: Allergic reactions--skin rash, itching, hives, swelling of the face, lips, tongue, or throat Unusual vaginal discharge, itching, or odor Vaginal bleeding after menopause, pelvic pain Side effects that usually do not require medical attention (report to your care team if they continue or are bothersome): Vaginal discharge Vaginal irritation at application site This list may not describe all possible side effects. Call your doctor for medical advice about side effects. You may report side effects to FDA at 1-800-FDA-1088. Where should I keep my medication? Keep out of the reach of children and pets. Store at room temperature or in a refrigerator between 5 and 30 degrees C (41 and 86 degrees F). Throw away any unused medication after the expiration date. NOTE: This sheet is a summary. It may not cover all possible information. If you  have questions about this medicine, talk to your doctor, pharmacist, or health care provider.  2024 Elsevier/Gold Standard (2021-09-06 00:00:00)

## 2024-03-18 NOTE — Progress Notes (Signed)
 GYNECOLOGY  VISIT   HPI: 73 y.o.   Widowed  Caucasian female   [id number] with Patient's last menstrual period was 08/29/2002 (approximate).   here for: Endometrial Biopsy   Patient is followed for simple endometrial hyperplasia and takes daily Provera .  Tolerating it well.   Her last EMB in August 13, 2023 showed benign superficial endometrial without glands.   Last pelvic US  02/20/24: Uterus 4.32 x 2.80 x 2.11 cm.  No myometrial masses.  EMS 1.44 mm with sliver of fluid at fundus. Left ovary 1.16 x 0.81 x 0.87 cm.  Normal. Right ovary 1.34 x 0.57 x 0.65 cm.  Normal.  No adnexal masses.  No free fluid.   In a new relationship and starting to become sexually active.      GYNECOLOGIC HISTORY: Patient's last menstrual period was 08/29/2002 (approximate). 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/24/2018   Major depressive disorder, recurrent episode, in full remission (HCC) 07/14/2018   Pain in joint of right elbow 12/04/2017   Osteopenia 02/11/2014   Benign paroxysmal positional vertigo 02/11/2014   Melanoma (HCC) 03/31/2012   History of depression 03/31/2012   Essential hypertension 03/31/2012    Past Medical History:  Diagnosis Date   Arthritis    Cancer (HCC)    right leg, upper thigh AND CALF melanoma   Depression    Family history of adverse reaction to anesthesia    older sisters x 2  with ponv   GERD (gastroesophageal reflux disease)    Hypertension    Patient denies   Melanoma (HCC)    Obstructive sleep apnea    uses cpap some nights set on 17 per cpap titration report 04-18-2020 epic   Osteopenia    PMB (postmenopausal bleeding)    Pre-diabetes    UTI (lower urinary tract infection)    Wears glasses     Past Surgical History:  Procedure Laterality Date   BREAST BIOPSY Left    several yrs ago   COLONOSCOPY  2020   DILATATION & CURETTAGE/HYSTEROSCOPY WITH MYOSURE N/A 06/05/2022   Procedure: DILATATION & CURETTAGE/HYSTEROSCOPY WITH MYOSURE;  Surgeon: Wanita Gutta, MD;  Location: [facility] [place];  Service: Gynecology;  Laterality: N/A;   MELANOMA EXCISION  2006   right thigh and calf   pre melamona area removed  10/2021   REFRACTIVE SURGERY     squamous cell area removed  11/2021   upper left leg   TONSILLECTOMY     as child   WISDOM TOOTH EXTRACTION     many yrs ago    Current Outpatient Medications  Medication  Sig Dispense Refill   amLODipine  (NORVASC ) 2.5 MG tablet TAKE 1 TABLET BY MOUTH EVERY DAY 90 tablet 2   aspirin  EC 81 MG tablet Take 1 tablet (81 mg total) by mouth daily. Swallow whole. 90 tablet 3   buPROPion  (WELLBUTRIN  XL) 150 MG 24 hr tablet Take 1 tablet (150 mg total) by mouth daily. 90 tablet 1   Co-Enzyme Q10 100 MG CAPS Take 1 capsule by mouth daily.     DULoxetine  (CYMBALTA ) 30 MG capsule TAKE 1 CAPSULES BY MOUTH DAILY WITH 60MG  CAPSULE TO EQUAL TOTAL DAILY DOSE OF 90MG  90 capsule 1   DULoxetine  (CYMBALTA ) 60 MG capsule TAKE 1 CAPSULE BY MOUTH DAILY WITH 30MG  CAPSULE TO EQUAL TOTAL DAILY DOSE OF 90MG  90 capsule 1   Ginkgo Biloba 40 MG TABS      losartan -hydrochlorothiazide (HYZAAR) 100-12.5 MG tablet TAKE 1 TABLET BY MOUTH EVERY DAY *SCHEDULE APPT FOR FUTURE REFILLS* 90 tablet 1   medroxyPROGESTERone  (PROVERA ) 10 MG tablet Take 1 tablet (10 mg total) by mouth daily. 90 tablet 0   Multiple Vitamin (MULTIVITAMIN) capsule  Take 1 capsule by mouth daily.     Omega-3 Fatty Acids (FISH OIL PO) Take by mouth daily.     RESTASIS 0.05 % ophthalmic emulsion 1 drop 2 (two) times daily.     traZODone  (DESYREL ) 50 MG tablet Take 50 mg by mouth at bedtime as needed for sleep.     rosuvastatin  (CRESTOR ) 5 MG tablet Take 1 tablet (5 mg total) by mouth every other day. (Patient not taking: Reported on 09/09/2023) 45 tablet 3   No current facility-administered medications for this visit.     ALLERGIES: Keflex [cephalexin] and Sulfa antibiotics  Family History  Problem Relation Age of Onset   Cancer Mother        breast   Stroke Mother    Breast cancer Mother    Depression Mother    Alcohol abuse Father    Heart attack Father    Other Sister 34       colon mass, benign   Hypertension Sister    Colon cancer Sister 45   Depression Sister    Colon polyps Sister    Prostate cancer Brother    Other Sister 21       colon mass- not colon cancer   Hypertension Sister    Colon polyps Sister    Anxiety disorder Maternal Aunt    Esophageal cancer Neg Hx    Rectal cancer Neg Hx    Stomach cancer Neg Hx     Social History   Socioeconomic History   Marital status: Widowed    Spouse name: Not on file   Number of children: 0   Years of education: Not on file   Highest education level: Bachelor's degree (e.g., BA, AB, BS)  Occupational History   Occupation: Retired Engineer, site and [employer]  Tobacco Use   Smoking status: Never   Smokeless tobacco: Never  Vaping Use   Vaping status: Never Used  Substance and Sexual Activity   Alcohol use: Yes    Alcohol/week: 1.0 - 2.0 standard drink of alcohol    Types: 1 - 2 Glasses of wine per week    Comment: 1 to  3 glasses wine per week   Drug use: No   Sexual activity: Yes    Birth control/protection: Post-menopausal    Comment: more than 5, after 14, no STD, no abnormal pap, no DES,  Other Topics Concern  Not on file  Social History Narrative   Not on  file   Social Drivers of Health   Financial Resource Strain: Low Risk  (07/17/2022)   Overall Financial Resource Strain (CARDIA)    Difficulty of Paying Living Expenses: Not hard at all  Food Insecurity: No Food Insecurity (07/17/2022)   Hunger Vital Sign    Worried About Running Out of Food in the Last Year: Never true    Ran Out of Food in the Last Year: Never true  Transportation Needs: No Transportation Needs (07/17/2022)   PRAPARE - Administrator, Civil Service (Medical): No    Lack of Transportation (Non-Medical): No  Physical Activity: Sufficiently Active (07/17/2022)   Exercise Vital Sign    Days of Exercise per Week: 4 days    Minutes of Exercise per Session: 60 min  Stress: No Stress Concern Present (07/17/2022)   [employer] of Occupational Health - Occupational Stress Questionnaire    Feeling of Stress : Not at all  Social Connections: Moderately Integrated (04/09/2022)   Social Connection and Isolation Panel [NHANES]    Frequency of Communication with Friends and Family: More than three times a week    Frequency of Social Gatherings with Friends and Family: Three times a week    Attends Religious Services: More than 4 times per year    Active Member of Clubs or Organizations: Yes    Attends Banker Meetings: More than 4 times per year    Marital Status: Widowed  Intimate Partner Violence: Not At Risk (07/04/2021)   Humiliation, Afraid, Rape, and Kick questionnaire    Fear of Current or Ex-Partner: No    Emotionally Abused: No    Physically Abused: No    Sexually Abused: No    Review of Systems  All other systems reviewed and are negative.   PHYSICAL EXAMINATION:   BP 134/84 (BP Location: Left Arm, Patient Position: Sitting)   Pulse 96   LMP 08/29/2002 (Approximate)   SpO2 98%     General appearance: alert, cooperative and appears stated age  Endometrial biopsy.  Consent for procedure. Time out done, formally post  procedure. Sterile prep with   Paracervical block with 1% lidocaine  10 cc, lot number  [id number]                    , expiration Sept 2026.  Tenaculum to anterior cervical lip. Pipelle passed to   6       cm twice.   Tissue to pathology.  Minimal EBL. No complications.   Chaperone was present for exam:  Cottie Diss, CMA  ASSESSMENT:  Simple endometrial hyperplasia without atypia.  Vaginal atrophy.   PLAN:  FU EMB.  May be ok to come off Provera  if bx is normal.  We discussed vaginal estradiol  tablets or intrarosa for treating atrophy.  She will read materials I provided and consider these options.

## 2024-03-20 LAB — SURGICAL PATHOLOGY

## 2024-03-25 ENCOUNTER — Ambulatory Visit: Payer: Self-pay | Admitting: Obstetrics and Gynecology

## 2024-03-25 NOTE — Telephone Encounter (Signed)
 Spoke with patient and reviewed EMB results. Scant cells seen but what was seen was benign. Maybe repeat EMB in 6 months again to make sure with Dr. Colvin Dec.   To continue progesterone. Has not started anything for her osteopenia with positive frax score with recommendations from PCP as well. Reviewed options. Patient interested in evenity/prolia. Counseled on the r/b/a/I of the medication Other options reviewed as well. To have PA run Dr. Tia Flowers

## 2024-04-06 ENCOUNTER — Other Ambulatory Visit: Payer: Self-pay | Admitting: *Deleted

## 2024-04-06 DIAGNOSIS — M810 Age-related osteoporosis without current pathological fracture: Secondary | ICD-10-CM

## 2024-04-06 MED ORDER — DENOSUMAB 60 MG/ML ~~LOC~~ SOSY: 60.0000 mg | PREFILLED_SYRINGE | Freq: Once | SUBCUTANEOUS | Status: AC

## 2024-04-09 ENCOUNTER — Telehealth: Payer: Self-pay

## 2024-04-09 ENCOUNTER — Ambulatory Visit: Admitting: Radiology

## 2024-04-09 VITALS — BP 138/82 | HR 80 | Temp 98.1°F | Wt 160.6 lb

## 2024-04-09 DIAGNOSIS — R319 Hematuria, unspecified: Secondary | ICD-10-CM

## 2024-04-09 DIAGNOSIS — N3091 Cystitis, unspecified with hematuria: Secondary | ICD-10-CM | POA: Diagnosis not present

## 2024-04-09 MED ORDER — NITROFURANTOIN MONOHYD MACRO 100 MG PO CAPS: 100.0000 mg | ORAL_CAPSULE | Freq: Two times a day (BID) | ORAL | 0 refills | Status: DC

## 2024-04-09 NOTE — Telephone Encounter (Signed)
 Patient called having UTI symptoms with blood in urine. Scheduled with Jami at 12 on 6/12.

## 2024-04-09 NOTE — Progress Notes (Signed)
 SUBJECTIVE: Lori Harvey is a 73 y.o. female who complains of hematuria, dysuria, frequency, urgency, chills. Symptoms began yesterday. No vaginal symptoms. No fever.  Allergies  Allergen Reactions   Keflex [Cephalexin]     Nausea, hives   Sulfa Antibiotics Rash    Current Outpatient Medications on File Prior to Visit  Medication Sig Dispense Refill   amLODipine  (NORVASC ) 2.5 MG tablet TAKE 1 TABLET BY MOUTH EVERY DAY 90 tablet 2   aspirin  EC 81 MG tablet Take 1 tablet (81 mg total) by mouth daily. Swallow whole. 90 tablet 3   buPROPion  (WELLBUTRIN  XL) 150 MG 24 hr tablet Take 1 tablet (150 mg total) by mouth daily. 90 tablet 1   Co-Enzyme Q10 100 MG CAPS Take 1 capsule by mouth daily.     DULoxetine  (CYMBALTA ) 30 MG capsule TAKE 1 CAPSULES BY MOUTH DAILY WITH 60MG  CAPSULE TO EQUAL TOTAL DAILY DOSE OF 90MG  90 capsule 1   DULoxetine  (CYMBALTA ) 60 MG capsule TAKE 1 CAPSULE BY MOUTH DAILY WITH 30MG  CAPSULE TO EQUAL TOTAL DAILY DOSE OF 90MG  90 capsule 1   Ginkgo Biloba 40 MG TABS      losartan -hydrochlorothiazide (HYZAAR) 100-12.5 MG tablet TAKE 1 TABLET BY MOUTH EVERY DAY *SCHEDULE APPT FOR FUTURE REFILLS* 90 tablet 1   medroxyPROGESTERone  (PROVERA ) 10 MG tablet Take 1 tablet (10 mg total) by mouth daily. 90 tablet 0   Multiple Vitamin (MULTIVITAMIN) capsule Take 1 capsule by mouth daily.     Omega-3 Fatty Acids (FISH OIL PO) Take by mouth daily.     RESTASIS 0.05 % ophthalmic emulsion 1 drop 2 (two) times daily.     rosuvastatin  (CRESTOR ) 5 MG tablet Take 1 tablet (5 mg total) by mouth every other day. 45 tablet 3   traZODone  (DESYREL ) 50 MG tablet Take 50 mg by mouth at bedtime as needed for sleep.     Current Facility-Administered Medications on File Prior to Visit  Medication Dose Route Frequency Provider Last Rate Last Admin   [START ON 04/20/2024] denosumab (PROLIA) injection 60 mg  60 mg Subcutaneous Once Boswell, Elizabeth K, MD        Past Medical History:  Diagnosis  Date   Arthritis    Cancer [facility])    right leg, upper thigh AND CALF melanoma   Depression    Family history of adverse reaction to anesthesia    older sisters x 2 with ponv   GERD (gastroesophageal reflux disease)    Hypertension    Patient denies   Melanoma (HCC)    Obstructive sleep apnea    uses cpap some nights set on 17 per cpap titration report 04-18-2020 epic   Osteopenia    PMB (postmenopausal bleeding)    Pre-diabetes    UTI (lower urinary tract infection)    Wears glasses      OBJECTIVE: Appears well, in no apparent distress.  Vital signs are normal. The abdomen is soft without tenderness, guarding, mass, rebound or organomegaly. No CVA tenderness or inguinal adenopathy noted. Urine dipstick shows positive for RBC's, positive for protein, and positive for leukocytes.  Micro exam: 20-40 WBC's per HPF, 10-20 RBC's per HPF, and moderate+ bacteria.    Blood pressure 138/82, pulse 80, temperature 98.1 F (36.7 C), temperature source Oral, weight 160 lb 9.6 oz (72.8 kg), last menstrual period 08/29/2002, SpO2 97%.    Chaperone offered and declined for exam.  ASSESSMENT/PLAN: 1. Hematuria, unspecified type (Primary) - Urinalysis,Complete w/RFL Culture  2. Cystitis  with hematuria - nitrofurantoin, macrocrystal-monohydrate, (MACROBID) 100 MG capsule; Take 1 capsule (100 mg total) by mouth 2 (two) times daily.  Dispense: 14 capsule; Refill: 0    Will send urine culture and sensitivity.  Treatment per orders - also push fluids, avoid bladder irritants. Instructed she may use Pyridium OTC prn. Call or return to clinic prn if these symptoms worsen or fail to improve as anticipated. Pyelo precautions reviewed with patient.   Kirby Cortese B, NP 12:12 PM

## 2024-04-10 LAB — URINALYSIS, COMPLETE W/RFL CULTURE
Bilirubin Urine: NEGATIVE
Glucose, UA: NEGATIVE
Hyaline Cast: NONE SEEN /LPF
Ketones, ur: NEGATIVE
Nitrites, Initial: NEGATIVE
Specific Gravity, Urine: 1.025 (ref 1.001–1.035)
pH: 5.5 (ref 5.0–8.0)

## 2024-04-10 LAB — URINE CULTURE
MICRO NUMBER:: 16572588
SPECIMEN QUALITY:: ADEQUATE

## 2024-04-10 LAB — CULTURE INDICATED

## 2024-04-13 ENCOUNTER — Ambulatory Visit: Payer: Self-pay | Admitting: Radiology

## 2024-04-16 DIAGNOSIS — Z86018 Personal history of other benign neoplasm: Secondary | ICD-10-CM | POA: Diagnosis not present

## 2024-04-16 DIAGNOSIS — L814 Other melanin hyperpigmentation: Secondary | ICD-10-CM | POA: Diagnosis not present

## 2024-04-16 DIAGNOSIS — L578 Other skin changes due to chronic exposure to nonionizing radiation: Secondary | ICD-10-CM | POA: Diagnosis not present

## 2024-04-16 DIAGNOSIS — D2271 Melanocytic nevi of right lower limb, including hip: Secondary | ICD-10-CM | POA: Diagnosis not present

## 2024-04-16 DIAGNOSIS — D2272 Melanocytic nevi of left lower limb, including hip: Secondary | ICD-10-CM | POA: Diagnosis not present

## 2024-04-16 DIAGNOSIS — L700 Acne vulgaris: Secondary | ICD-10-CM | POA: Diagnosis not present

## 2024-04-16 DIAGNOSIS — Z85820 Personal history of malignant melanoma of skin: Secondary | ICD-10-CM | POA: Diagnosis not present

## 2024-04-28 ENCOUNTER — Encounter: Payer: Self-pay | Admitting: Obstetrics and Gynecology

## 2024-04-28 DIAGNOSIS — R319 Hematuria, unspecified: Secondary | ICD-10-CM

## 2024-04-28 NOTE — Telephone Encounter (Signed)
 Dr. Nikki -please review and advise.   Per review of 04/09/24 UC -mixed flora on sample. Was tx with macrobid  100 mg BID x 7 days. Per f/u on 04/13/24, blood had resolved.

## 2024-04-28 NOTE — Telephone Encounter (Signed)
 Spoke with patient, advised per Dr. Nikki. Patient agreeable and agreeable to referral. Reviewed urology options, will refer to Dr. Elisabeth at [facility]. Patient is aware their office will contact her directly to schedule.   Routing to provider for final review. Patient is agreeable to disposition. Will close encounter.  Cc: Renda

## 2024-04-29 NOTE — Telephone Encounter (Signed)
 PA for Prolia  denied due to medical criteria not met. Please advise if appeal of prolia  is recommended.

## 2024-05-04 ENCOUNTER — Encounter: Payer: Self-pay | Admitting: *Deleted

## 2024-05-05 NOTE — Telephone Encounter (Signed)
 Letter of appeal for prolia  reviewed and signed by Dr. Nikki and faxed to [facility] at [phone]. Will await response.

## 2024-05-10 ENCOUNTER — Other Ambulatory Visit: Payer: Self-pay | Admitting: Obstetrics and Gynecology

## 2024-05-10 DIAGNOSIS — N8501 Benign endometrial hyperplasia: Secondary | ICD-10-CM

## 2024-05-11 NOTE — Telephone Encounter (Signed)
 Medication refill request: provera  10mg  Last AEX:  02-18-24 Next AEX: not scheduled Last MMG (if hormonal medication request): 05-21-23 category 1 neg Refill authorized: please approve or deny as appropriate

## 2024-05-15 ENCOUNTER — Other Ambulatory Visit

## 2024-05-15 ENCOUNTER — Other Ambulatory Visit: Payer: Self-pay | Admitting: *Deleted

## 2024-05-15 DIAGNOSIS — M810 Age-related osteoporosis without current pathological fracture: Secondary | ICD-10-CM

## 2024-05-15 DIAGNOSIS — H04123 Dry eye syndrome of bilateral lacrimal glands: Secondary | ICD-10-CM | POA: Diagnosis not present

## 2024-05-15 DIAGNOSIS — H2513 Age-related nuclear cataract, bilateral: Secondary | ICD-10-CM | POA: Diagnosis not present

## 2024-05-15 LAB — CALCIUM: Calcium: 9.4 mg/dL (ref 8.6–10.4)

## 2024-05-21 ENCOUNTER — Ambulatory Visit: Payer: Self-pay | Admitting: Nurse Practitioner

## 2024-06-09 ENCOUNTER — Other Ambulatory Visit: Payer: Self-pay | Admitting: Family Medicine

## 2024-09-10 ENCOUNTER — Other Ambulatory Visit: Payer: Self-pay | Admitting: Family Medicine

## 2024-09-14 ENCOUNTER — Ambulatory Visit (INDEPENDENT_AMBULATORY_CARE_PROVIDER_SITE_OTHER): Admitting: Family Medicine

## 2024-09-14 ENCOUNTER — Encounter: Payer: Self-pay | Admitting: Family Medicine

## 2024-09-14 VITALS — BP 142/74 | HR 83 | Temp 97.6°F | Wt 166.1 lb

## 2024-09-14 DIAGNOSIS — D126 Benign neoplasm of colon, unspecified: Secondary | ICD-10-CM | POA: Diagnosis not present

## 2024-09-14 DIAGNOSIS — Z0000 Encounter for general adult medical examination without abnormal findings: Secondary | ICD-10-CM | POA: Diagnosis not present

## 2024-09-14 DIAGNOSIS — D369 Benign neoplasm, unspecified site: Secondary | ICD-10-CM | POA: Diagnosis not present

## 2024-09-14 DIAGNOSIS — E785 Hyperlipidemia, unspecified: Secondary | ICD-10-CM

## 2024-09-14 LAB — CBC WITH DIFFERENTIAL/PLATELET
Basophils Absolute: 0 K/uL (ref 0.0–0.1)
Basophils Relative: 0.5 % (ref 0.0–3.0)
Eosinophils Absolute: 0.2 K/uL (ref 0.0–0.7)
Eosinophils Relative: 3.3 % (ref 0.0–5.0)
HCT: 41.4 % (ref 36.0–46.0)
Hemoglobin: 13.7 g/dL (ref 12.0–15.0)
Lymphocytes Relative: 34.3 % (ref 12.0–46.0)
Lymphs Abs: 2.5 K/uL (ref 0.7–4.0)
MCHC: 33.1 g/dL (ref 30.0–36.0)
MCV: 85.9 fl (ref 78.0–100.0)
Monocytes Absolute: 0.5 K/uL (ref 0.1–1.0)
Monocytes Relative: 6.2 % (ref 3.0–12.0)
Neutro Abs: 4 K/uL (ref 1.4–7.7)
Neutrophils Relative %: 55.7 % (ref 43.0–77.0)
Platelets: 340 K/uL (ref 150.0–400.0)
RBC: 4.83 Mil/uL (ref 3.87–5.11)
RDW: 13.8 % (ref 11.5–15.5)
WBC: 7.3 K/uL (ref 4.0–10.5)

## 2024-09-14 LAB — COMPREHENSIVE METABOLIC PANEL WITH GFR
ALT: 15 U/L (ref 0–35)
AST: 16 U/L (ref 0–37)
Albumin: 4.3 g/dL (ref 3.5–5.2)
Alkaline Phosphatase: 59 U/L (ref 39–117)
BUN: 16 mg/dL (ref 6–23)
CO2: 25 meq/L (ref 19–32)
Calcium: 9 mg/dL (ref 8.4–10.5)
Chloride: 102 meq/L (ref 96–112)
Creatinine, Ser: 1.03 mg/dL (ref 0.40–1.20)
GFR: 53.39 mL/min — ABNORMAL LOW (ref >60.00)
Glucose, Bld: 107 mg/dL — ABNORMAL HIGH (ref 70–99)
Potassium: 3.7 meq/L (ref 3.5–5.1)
Sodium: 137 meq/L (ref 135–145)
Total Bilirubin: 0.6 mg/dL (ref 0.2–1.2)
Total Protein: 6.9 g/dL (ref 6.0–8.3)

## 2024-09-14 LAB — LIPID PANEL
Cholesterol: 153 mg/dL (ref 0–200)
HDL: 70.8 mg/dL (ref >39.00)
LDL Cholesterol: 67 mg/dL (ref 0–99)
NonHDL: 81.96
Total CHOL/HDL Ratio: 2
Triglycerides: 75 mg/dL (ref 0.0–149.0)
VLDL: 15 mg/dL (ref 0.0–40.0)

## 2024-09-14 NOTE — Progress Notes (Addendum)
 Established Patient Office Visit  Subjective   Patient ID: Lori Harvey, female    DOB: 1951-03-05  Age: 73 y.o. MRN: [id number]  Chief Complaint  Patient presents with   Medical Management of Chronic Issues    HPI    Lori Harvey is seen for physical exam.  She has history of hypertension, obstructive sleep apnea, history of depression, history of melanoma.  She is very health-conscious.  Stays very active.  She does have history of osteopenia with high FRAX score.  She has had some reluctance to consider specific medical therapies.  Her current medications include low-dose Crestor , amlodipine , and losartan /hydrochlorothiazide.  She does have history of colon adenoma on colonoscopy 2020 and is overdue for repeat colonoscopy.  Health maintenance reviewed:  Health Maintenance  Topic Date Due   Medicare Annual Wellness (AWV)  07/18/2023   Colonoscopy  01/12/2024   DTaP/Tdap/Td (3 - Td or Tdap) 02/12/2024   Mammogram  05/20/2024   COVID-19 Vaccine (4 - 2025-26 season) 06/29/2024   Pneumococcal Vaccine: 50+ Years  Completed   Influenza Vaccine  Completed   DEXA SCAN  Completed   Hepatitis C Screening  Completed   Zoster Vaccines- Shingrix  Completed   Meningococcal B Vaccine  Aged Out   - She agrees to setting up repeat colonoscopy now - She would like to consider repeat DEXA scan in 1 year - She plans to set up repeat mammogram later this year - RSV already given - Patient already has had flu vaccine  Social History   Socioeconomic History   Marital status: Widowed    Spouse name: Not on file   Number of children: 0   Years of education: Not on file   Highest education level: Bachelor's degree (e.g., BA, AB, BS)  Occupational History   Occupation: Retired engineer, site and [employer]  Tobacco Use   Smoking status: Never   Smokeless tobacco: Never  Vaping Use   Vaping status: Never Used  Substance and Sexual Activity   Alcohol use: Yes    Alcohol/week: 1.0 -  2.0 standard drink of alcohol    Types: 1 - 2 Glasses of wine per week    Comment: 1 to  3 glasses wine per week   Drug use: No   Sexual activity: Yes    Birth control/protection: Post-menopausal    Comment: more than 5, after 14, no STD, no abnormal pap, no DES,  Other Topics Concern   Not on file  Social History Narrative   Not on file   Social Drivers of Health   Financial Resource Strain: Low Risk  (09/13/2024)   Overall Financial Resource Strain (CARDIA)    Difficulty of Paying Living Expenses: Not hard at all  Food Insecurity: No Food Insecurity (09/13/2024)   Hunger Vital Sign    Worried About Running Out of Food in the Last Year: Never true    Ran Out of Food in the Last Year: Never true  Transportation Needs: No Transportation Needs (09/13/2024)   PRAPARE - Administrator, Civil Service (Medical): No    Lack of Transportation (Non-Medical): No  Physical Activity: Inactive (09/13/2024)   Exercise Vital Sign    Days of Exercise per Week: 0 days    Minutes of Exercise per Session: Not on file  Stress: No Stress Concern Present (09/13/2024)   [employer] of Occupational Health - Occupational Stress Questionnaire    Feeling of Stress: Not at all  Social Connections: Moderately Integrated (  09/13/2024)   Social Connection and Isolation Panel    Frequency of Communication with Friends and Family: More than three times a week    Frequency of Social Gatherings with Friends and Family: Twice a week    Attends Religious Services: More than 4 times per year    Active Member of [employer] or Organizations: Yes    Attends Banker Meetings: More than 4 times per year    Marital Status: Widowed  Intimate Partner Violence: Not At Risk (07/04/2021)   Humiliation, Afraid, Rape, and Kick questionnaire    Fear of Current or Ex-Partner: No    Emotionally Abused: No    Physically Abused: No    Sexually Abused: No   Family History  Problem Relation Age of Onset    Cancer Mother        breast   Stroke Mother    Breast cancer Mother    Depression Mother    Alcohol abuse Father    Heart attack Father    Other Sister 5       colon mass, benign   Hypertension Sister    Colon cancer Sister 88   Depression Sister    Colon polyps Sister    Prostate cancer Brother    Other Sister 26       colon mass- not colon cancer   Hypertension Sister    Colon polyps Sister    Anxiety disorder Maternal Aunt    Esophageal cancer Neg Hx    Rectal cancer Neg Hx    Stomach cancer Neg Hx    Past Medical History:  Diagnosis Date   Arthritis    Cancer (HCC)    right leg, upper thigh AND CALF melanoma   Depression    Family history of adverse reaction to anesthesia    older sisters x 2 with ponv   GERD (gastroesophageal reflux disease)    Hypertension    Patient denies   Melanoma (HCC)    Obstructive sleep apnea    uses cpap some nights set on 17 per cpap titration report 04-18-2020 epic   Osteopenia    PMB (postmenopausal bleeding)    Pre-diabetes    UTI (lower urinary tract infection)    Wears glasses    Past Surgical History:  Procedure Laterality Date   BREAST BIOPSY Left    several yrs ago   COLONOSCOPY  2020   DILATATION & CURETTAGE/HYSTEROSCOPY WITH MYOSURE N/A 06/05/2022   Procedure: DILATATION & CURETTAGE/HYSTEROSCOPY WITH MYOSURE;  Surgeon: Jannis Kate Norris, MD;  Location: [facility] [place];  Service: Gynecology;  Laterality: N/A;   MELANOMA EXCISION  2006   right thigh and calf   pre melamona area removed  10/2021   REFRACTIVE SURGERY     squamous cell area removed  11/2021   upper left leg   TONSILLECTOMY     as child   WISDOM TOOTH EXTRACTION     many yrs ago    reports that she has never smoked. She has never used smokeless tobacco. She reports current alcohol use of about 1.0 - 2.0 standard drink of alcohol per week. She reports that she does not use drugs. family history includes Alcohol abuse in her father;  Anxiety disorder in her maternal aunt; Breast cancer in her mother; Cancer in her mother; Colon cancer (age of onset: 19) in her sister; Colon polyps in her sister and sister; Depression in her mother and sister; Heart attack in her father; Hypertension in her  sister and sister; Other (age of onset: 35) in her sister; Other (age of onset: 33) in her sister; Prostate cancer in her brother; Stroke in her mother. Allergies  Allergen Reactions   Keflex [Cephalexin]     Nausea, hives   Sulfa Antibiotics Rash     Review of Systems  Constitutional:  Negative for chills, fever, malaise/fatigue and weight loss.  HENT:  Negative for hearing loss.   Eyes:  Negative for blurred vision and double vision.  Respiratory:  Negative for cough and shortness of breath.   Cardiovascular:  Negative for chest pain, palpitations and leg swelling.  Gastrointestinal:  Negative for abdominal pain, blood in stool, constipation and diarrhea.  Genitourinary:  Negative for dysuria.  Skin:  Negative for rash.  Neurological:  Negative for dizziness, speech change, seizures, loss of consciousness and headaches.  Psychiatric/Behavioral:  Negative for depression.       Objective:     BP (!) 142/74   Pulse 83   Temp 97.6 F (36.4 C) (Oral)   Wt 166 lb 1.6 oz (75.3 kg)   LMP 08/29/2002 (Approximate)   SpO2 97%   BMI 27.22 kg/m  BP Readings from Last 3 Encounters:  09/14/24 (!) 142/74  04/09/24 138/82  03/18/24 134/84   Wt Readings from Last 3 Encounters:  09/14/24 166 lb 1.6 oz (75.3 kg)  04/09/24 160 lb 9.6 oz (72.8 kg)  02/20/24 160 lb (72.6 kg)      Physical Exam Vitals reviewed.  Constitutional:      General: She is not in acute distress.    Appearance: She is well-developed. She is not ill-appearing.  HENT:     Head: Normocephalic and atraumatic.  Eyes:     Pupils: Pupils are equal, round, and reactive to light.  Neck:     Thyroid : No thyromegaly.  Cardiovascular:     Rate and Rhythm:  Normal rate.     Heart sounds: Normal heart sounds. No murmur heard.    Comments: Does have occasional premature beat on exam but no consistent pattern Pulmonary:     Effort: No respiratory distress.     Breath sounds: Normal breath sounds. No wheezing or rales.  Abdominal:     General: Bowel sounds are normal. There is no distension.     Palpations: Abdomen is soft. There is no mass.     Tenderness: There is no abdominal tenderness. There is no guarding or rebound.  Musculoskeletal:        General: Normal range of motion.     Cervical back: Normal range of motion and neck supple.  Lymphadenopathy:     Cervical: No cervical adenopathy.  Skin:    Findings: No rash.  Neurological:     Mental Status: She is alert and oriented to person, place, and time.     Cranial Nerves: No cranial nerve deficit.  Psychiatric:        Behavior: Behavior normal.        Thought Content: Thought content normal.        Judgment: Judgment normal.      No results found for any visits on 09/14/24.  Last CBC Lab Results  Component Value Date   WBC 7.0 06/07/2023   HGB 13.3 06/07/2023   HCT 41.5 06/07/2023   MCV 86.5 06/07/2023   MCH 27.7 09/08/2019   RDW 13.6 06/07/2023   PLT 317.0 06/07/2023   Last metabolic panel Lab Results  Component Value Date   GLUCOSE 105 (H) 06/07/2023  NA 139 06/07/2023   K 3.8 06/07/2023   CL 103 06/07/2023   CO2 29 06/07/2023   BUN 13 06/07/2023   CREATININE 1.05 06/07/2023   GFR 52.64 (L) 06/07/2023   CALCIUM  9.4 05/15/2024   PHOS 3.5 10/01/2018   PROT 6.6 06/07/2023   ALBUMIN 4.1 06/07/2023   LABGLOB 2.4 09/08/2019   AGRATIO 1.8 09/08/2019   BILITOT 0.5 06/07/2023   ALKPHOS 68 06/07/2023   AST 16 06/07/2023   ALT 12 06/07/2023   Last lipids Lab Results  Component Value Date   CHOL 163 06/07/2023   HDL 59.70 06/07/2023   LDLCALC 83 06/07/2023   TRIG 103.0 06/07/2023   CHOLHDL 3 06/07/2023   Last hemoglobin A1c Lab Results  Component Value  Date   HGBA1C 5.7 (H) 09/08/2019   Last thyroid  functions Lab Results  Component Value Date   TSH 3.14 06/07/2023      The 10-year ASCVD risk score (Arnett DK, et al., 2019) is: 22.3%    Assessment & Plan:   Problem List Items Addressed This Visit   None Visit Diagnoses       Adenoma    -  Primary   Relevant Orders   Ambulatory referral to Gastroenterology     Physical exam       Relevant Orders   Lipid panel   CMP   CBC with Differential/Platelet   Hemoglobin A1c     Hyperlipidemia, unspecified hyperlipidemia type       Relevant Orders   Lipid panel     Here for annual wellness visit.  Vaccines up-to-date.  Needs follow-up colonoscopy with history of colon adenoma 2020.  Referral placed.  Repeat DEXA scan by next year.  Continue daily calcium  and vitamin D  along with regular weightbearing exercise  Lori Harvey does have some occasional premature beats on exam today and suspect PACs or PVCs.  These are somewhat infrequent and she is asymptomatic.  We did suggest possibly scaling back caffeine a bit and follow-up promptly if she has any dizziness, shortness of breath, or other concerns.  -Check follow-up labs with lipid, CMP, A1c, CBC - Blood pressure was up a bit today.  Would recommend she monitor closely over the next few weeks and be in touch if consistently over 140 systolic.  No follow-ups on file.    Wolm Scarlet, MD

## 2024-09-15 ENCOUNTER — Ambulatory Visit: Payer: Self-pay | Admitting: Family Medicine

## 2024-09-15 LAB — HEMOGLOBIN A1C: Hgb A1c MFr Bld: 6 % (ref 4.6–6.5)

## 2024-09-16 ENCOUNTER — Other Ambulatory Visit: Payer: Self-pay | Admitting: Family Medicine

## 2024-09-16 ENCOUNTER — Encounter: Payer: Self-pay | Admitting: Obstetrics and Gynecology

## 2024-09-16 DIAGNOSIS — Z1231 Encounter for screening mammogram for malignant neoplasm of breast: Secondary | ICD-10-CM

## 2024-09-17 ENCOUNTER — Ambulatory Visit
Admission: RE | Admit: 2024-09-17 | Discharge: 2024-09-17 | Disposition: A | Discharge status: Home / Self Care | Source: Ambulatory Visit | Attending: Family Medicine | Admitting: Family Medicine

## 2024-09-17 DIAGNOSIS — Z1231 Encounter for screening mammogram for malignant neoplasm of breast: Secondary | ICD-10-CM

## 2024-09-26 ENCOUNTER — Encounter: Payer: Self-pay | Admitting: Internal Medicine

## 2024-10-03 ENCOUNTER — Other Ambulatory Visit: Payer: Self-pay | Admitting: Medical Genetics

## 2024-10-06 ENCOUNTER — Ambulatory Visit: Admitting: Family Medicine

## 2024-10-06 ENCOUNTER — Other Ambulatory Visit: Payer: Self-pay | Admitting: Family Medicine

## 2024-10-06 DIAGNOSIS — Z0000 Encounter for general adult medical examination without abnormal findings: Secondary | ICD-10-CM

## 2024-10-06 NOTE — Patient Instructions (Signed)
 I really enjoyed getting to talk with you today! I am available on Tuesdays and Thursdays for virtual visits if you have any questions or concerns, or if I can be of any further assistance.   CHECKLIST FROM ANNUAL WELLNESS VISIT:  -Follow up (please call to schedule if not scheduled after visit):   -yearly for annual wellness visit with primary care office  Here is a list of your preventive care/health maintenance measures and the plan for each if any are due:  PLAN For any measures below that may be due:    1. Please call to schedule colonoscopy: [phone]   2. Can get the vaccines at the pharmacy - please let us  know if you do so that we can update your record.   Health Maintenance  Topic Date Due   Medicare Annual Wellness (AWV)  07/18/2023   Colonoscopy  01/12/2024   DTaP/Tdap/Td (3 - Td or Tdap) 02/12/2024   COVID-19 Vaccine (4 - 2025-26 season) 06/29/2024   Mammogram  09/17/2025   Pneumococcal Vaccine: 50+ Years  Completed   Influenza Vaccine  Completed   Bone Density Scan  Completed   Hepatitis C Screening  Completed   Zoster Vaccines- Shingrix  Completed   Meningococcal B Vaccine  Aged Out    -See a dentist at least yearly  -Get your eyes checked and then per your eye specialist's recommendations  -Other issues addressed today:   -I have included below further information regarding a healthy whole foods based diet, physical activity guidelines for adults, stress management and opportunities for social connections. I hope you find this information useful.   -----------------------------------------------------------------------------------------------------------------------------------------------------------------------------------------------------------------------------------------------------------    NUTRITION: -eat real food: lots of colorful vegetables (half the plate) and fruits -5-7 servings of vegetables and fruits per day (fresh or steamed is  best), exp. 2 servings of vegetables with lunch and dinner and 2 servings of fruit per day. Berries and greens such as kale and collards are great choices.  -consume on a regular basis:  fresh fruits, fresh veggies, fish, nuts, seeds, healthy oils (such as olive oil, avocado oil), whole grains (make sure for bread/pasta/crackers/etc., that the first ingredient on label contains the word whole), legumes. -can eat small amounts of dairy and lean meat (no larger than the palm of your hand), but avoid processed meats such as ham, bacon, lunch meat, etc. -drink water -try to avoid fast food and pre-packaged foods, processed meat, ultra processed foods/beverages (donuts, candy, etc.) -most experts advise limiting sodium to < 2300mg  per day, should limit further is any chronic conditions such as high blood pressure, heart disease, diabetes, etc. The American Heart Association advised that < 1500mg  is is ideal -try to avoid foods/beverages that contain any ingredients with names you do not recognize  -try to avoid foods/beverages  with added sugar or sweeteners/sweets  -try to avoid sweet drinks (including diet drinks): soda, juice, Gatorade, sweet tea, power drinks, diet drinks -try to avoid white rice, white bread, pasta (unless whole grain)  EXERCISE GUIDELINES FOR ADULTS: -if you wish to increase your physical activity, do so gradually and with the approval of your doctor -STOP and seek medical care immediately if you have any chest pain, chest discomfort or trouble breathing when starting or increasing exercise  -move and stretch your body, legs, feet and arms when sitting for long periods -Physical activity guidelines for optimal health in adults: -get at least 150 minutes per week of moderate exercise (can talk, but not sing); this is about 20-30  minutes of sustained activity 5-7 days per week or two 10-15 minute episodes of sustained activity 5-7 days per week -do some muscle building/resistance  training/strength training at least 2 days per week  -balance exercises 3+ days per week:   Stand somewhere where you have something sturdy to hold onto if you lose balance    1) lift up on toes, then back down, start with 5x per day and work up to 20x   2) stand and lift one leg straight out to the side so that foot is a few inches of the floor, start with 5x each side and work up to 20x each side   3) stand on one foot, start with 5 seconds each side and work up to 20 seconds on each side  If you need ideas or help with getting more active:  -Silver sneakers https://tools.silversneakers.com  -Walk with a Doc: Http://www.duncan-williams.com/  -try to include resistance (weight lifting/strength building) and balance exercises twice per week: or the following link for ideas: http://castillo-powell.com/  buyducts.dk  STRESS MANAGEMENT: -can try meditating, or just sitting quietly with deep breathing while intentionally relaxing all parts of your body for 5 minutes daily -if you need further help with stress, anxiety or depression please follow up with your primary doctor or contact the wonderful folks at [facility]: [phone]  SOCIAL CONNECTIONS: -options in [place] if you wish to engage in more social and exercise related activities:  -Silver sneakers https://tools.silversneakers.com  -Walk with a Doc: Http://www.duncan-williams.com/  -Check out the [facility] Active Adults 50+ section on the [place] of [employer] (hiking clubs, book clubs, cards and games, chess, exercise classes, aquatic classes and much more) - see the website for details: https://www.[place].gov/departments/parks-recreation/active-adults50  -YouTube has lots of exercise videos for different ages and abilities as well  -[facility] (a variety of indoor and outdoor inperson activities for  adults). [phone]. [address]  -Virtual Online Classes (a variety of topics): see seniorplanet.org or call [phone]  -consider volunteering at a school, hospice center, church, senior center or elsewhere

## 2024-10-06 NOTE — Progress Notes (Signed)
 ----------------------------------------------------------------------------------------------------------------------------------------------------------------------------------------------------------------------  Because this visit was a virtual/telehealth visit, some criteria may be missing or patient reported. Any vitals not documented were not able to be obtained and vitals that have been documented are patient reported.    MEDICARE ANNUAL PREVENTIVE VISIT WITH PROVIDER: (Welcome to Medicare, initial annual wellness or annual wellness exam)  Virtual Visit via Video Note  I connected with Lori Harvey on 10/06/24 by a video enabled telemedicine application and verified that I am speaking with the correct person using two identifiers.  Location patient: home Location provider:work or home office Persons participating in the virtual visit: patient, provider  Concerns and/or follow up today: detailed intake and risks/health assessment completed in flowsheets and below - please see for details.   How often do you have a drink containing alcohol? 1-2 times per week How many drinks containing alcohol do you have on a typical day when you are drinking? 1 How often do you have six or more drinks on one occasion?never Have you ever smoked?n Quit date if applicable? na  How many packs a day do/did you smoke? na Do you use smokeless tobacco?na Do you use an illicit drugs?n Do you feel safe at home?y Last dentist visit?goes twice a year Last eye Exam and location? Several times a year   See HM section in Epic for other details of completed HM.    ROS: negative for report of fevers, unintentional weight loss, vision changes, vision loss, hearing loss or change, chest pain, sob, hemoptysis, melena, hematochezia, hematuria or bleeding or bruising  Patient-completed extensive health risk assessment - reviewed and discussed with the patient: See Health Risk Assessment completed with  patient prior to the visit either above or in recent phone note. This was reviewed in detailed with the patient today and appropriate recommendations, orders and referrals were placed as needed per Summary below and patient instructions.   Review of Medical History: -PMH, PSH, Family History and current specialty and care providers reviewed and updated and listed below   Patient Care Team: Micheal Wolm ORN, MD as PCP - General (Family Medicine) Waylan Cain, MD as Consulting Physician (Ophthalmology)   Past Medical History:  Diagnosis Date   Arthritis    Cancer [facility])    right leg, upper thigh AND CALF melanoma   Depression    Family history of adverse reaction to anesthesia    older sisters x 2 with ponv   GERD (gastroesophageal reflux disease)    Hypertension    Patient denies   Melanoma (HCC)    Obstructive sleep apnea    uses cpap some nights set on 17 per cpap titration report 04-18-2020 epic   Osteopenia    PMB (postmenopausal bleeding)    Pre-diabetes    UTI (lower urinary tract infection)    Wears glasses     Past Surgical History:  Procedure Laterality Date   BREAST BIOPSY Left    several yrs ago   COLONOSCOPY  2020   DILATATION & CURETTAGE/HYSTEROSCOPY WITH MYOSURE N/A 06/05/2022   Procedure: DILATATION & CURETTAGE/HYSTEROSCOPY WITH MYOSURE;  Surgeon: Jannis Kate Norris, MD;  Location: [facility] [place];  Service: Gynecology;  Laterality: N/A;   MELANOMA EXCISION  2006   right thigh and calf   pre melamona area removed  10/2021   REFRACTIVE SURGERY     squamous cell area removed  11/2021   upper left leg   TONSILLECTOMY     as child   WISDOM TOOTH EXTRACTION  many yrs ago    Social History   Socioeconomic History   Marital status: Widowed    Spouse name: Not on file   Number of children: 0   Years of education: Not on file   Highest education level: Bachelor's degree (e.g., BA, AB, BS)  Occupational History   Occupation: Retired  engineer, site and [employer]  Tobacco Use   Smoking status: Never   Smokeless tobacco: Never  Vaping Use   Vaping status: Never Used  Substance and Sexual Activity   Alcohol use: Yes    Alcohol/week: 1.0 - 2.0 standard drink of alcohol    Types: 1 - 2 Glasses of wine per week    Comment: 1 to  3 glasses wine per week   Drug use: No   Sexual activity: Yes    Birth control/protection: Post-menopausal    Comment: more than 5, after 14, no STD, no abnormal pap, no DES,  Other Topics Concern   Not on file  Social History Narrative   Not on file   Social Drivers of Health   Financial Resource Strain: Low Risk  (09/13/2024)   Overall Financial Resource Strain (CARDIA)    Difficulty of Paying Living Expenses: Not hard at all  Food Insecurity: No Food Insecurity (09/13/2024)   Hunger Vital Sign    Worried About Running Out of Food in the Last Year: Never true    Ran Out of Food in the Last Year: Never true  Transportation Needs: No Transportation Needs (09/13/2024)   PRAPARE - Administrator, Civil Service (Medical): No    Lack of Transportation (Non-Medical): No  Physical Activity: Insufficiently Active (10/06/2024)   Exercise Vital Sign    Days of Exercise per Week: 2 days    Minutes of Exercise per Session: 60 min  Stress: No Stress Concern Present (10/06/2024)   [employer] of Occupational Health - Occupational Stress Questionnaire    Feeling of Stress: Not at all  Social Connections: Unknown (10/06/2024)   Social Connection and Isolation Panel    Frequency of Communication with Friends and Family: Not on file    Frequency of Social Gatherings with Friends and Family: More than three times a week    Attends Religious Services: More than 4 times per year    Active Member of [employer] or Organizations: Yes    Attends Banker Meetings: More than 4 times per year    Marital Status: Not on file  Intimate Partner Violence: Not At Risk (07/04/2021)    Humiliation, Afraid, Rape, and Kick questionnaire    Fear of Current or Ex-Partner: No    Emotionally Abused: No    Physically Abused: No    Sexually Abused: No    Family History  Problem Relation Age of Onset   Cancer Mother        breast   Stroke Mother    Breast cancer Mother    Depression Mother    Alcohol abuse Father    Heart attack Father    Other Sister 9       colon mass, benign   Hypertension Sister    Colon cancer Sister 26   Depression Sister    Colon polyps Sister    Prostate cancer Brother    Other Sister 30       colon mass- not colon cancer   Hypertension Sister    Colon polyps Sister    Anxiety disorder Maternal Aunt  Esophageal cancer Neg Hx    Rectal cancer Neg Hx    Stomach cancer Neg Hx     Current Outpatient Medications on File Prior to Visit  Medication Sig Dispense Refill   aspirin  EC 81 MG tablet Take 1 tablet (81 mg total) by mouth daily. Swallow whole. 90 tablet 3   buPROPion  (WELLBUTRIN  XL) 150 MG 24 hr tablet Take 1 tablet (150 mg total) by mouth daily. 90 tablet 1   Co-Enzyme Q10 100 MG CAPS Take 1 capsule by mouth daily.     DULoxetine  (CYMBALTA ) 30 MG capsule TAKE 1 CAPSULES BY MOUTH DAILY WITH 60MG  CAPSULE TO EQUAL TOTAL DAILY DOSE OF 90MG  90 capsule 1   amLODipine  (NORVASC ) 2.5 MG tablet TAKE 1 TABLET BY MOUTH EVERY DAY 90 tablet 2   DULoxetine  (CYMBALTA ) 60 MG capsule TAKE 1 CAPSULE BY MOUTH DAILY WITH 30MG  CAPSULE TO EQUAL TOTAL DAILY DOSE OF 90MG  90 capsule 1   Ginkgo Biloba 40 MG TABS      medroxyPROGESTERone  (PROVERA ) 10 MG tablet TAKE 1 TABLET BY MOUTH EVERY DAY 90 tablet 0   Multiple Vitamin (MULTIVITAMIN) capsule Take 1 capsule by mouth daily.     Omega-3 Fatty Acids (FISH OIL PO) Take by mouth daily.     RESTASIS 0.05 % ophthalmic emulsion 1 drop 2 (two) times daily.     rosuvastatin  (CRESTOR ) 5 MG tablet Take 1 tablet (5 mg total) by mouth every other day. 45 tablet 3   traZODone  (DESYREL ) 50 MG tablet Take 50 mg by mouth  at bedtime as needed for sleep.     Current Facility-Administered Medications on File Prior to Visit  Medication Dose Route Frequency Provider Last Rate Last Admin   denosumab  (PROLIA ) injection 60 mg  60 mg Subcutaneous Once Boswell, Elizabeth K, MD        Allergies  Allergen Reactions   Keflex [Cephalexin]     Nausea, hives   Sulfa Antibiotics Rash       Physical Exam Vitals requested from patient and listed below if patient had equipment and was able to obtain at home for this virtual visit: There were no vitals filed for this visit. Estimated body mass index is 27.22 kg/m as calculated from the following:   Height as of 02/20/24: 5' 5.5 (1.664 m).   Weight as of 09/14/24: 166 lb 1.6 oz (75.3 kg).  EKG (optional): deferred due to virtual visit  GENERAL: alert, oriented, no acute distress detected, full vision exam deferred due to pandemic and/or virtual encounter  HEENT: atraumatic, conjunttiva clear, no obvious abnormalities on inspection of external nose and ears  NECK: normal movements of the head and neck  LUNGS: on inspection no signs of respiratory distress, breathing rate appears normal, no obvious gross SOB, gasping or wheezing  CV: no obvious cyanosis  MS: moves all visible extremities without noticeable abnormality  PSYCH/NEURO: pleasant and cooperative, no obvious depression or anxiety, speech and thought processing grossly intact, Cognitive function grossly intact  Flowsheet Row Office Visit from 09/14/2024 in [facility]  PHQ-9 Total Score 0        10/06/2024   11:55 AM 09/14/2024    1:17 PM 06/17/2023   10:19 AM 07/17/2022   11:01 AM 07/04/2021   11:50 AM  Depression screen PHQ 2/9  Decreased Interest 0 0 1 0 0  Down, Depressed, Hopeless  0 1 0 0  PHQ - 2 Score 0 0 2 0 0  Altered sleeping  0 3    Tired, decreased energy  0 1    Change in appetite  0 0    Feeling bad or failure about yourself   0 0    Trouble  concentrating  0 0    Moving slowly or fidgety/restless  0 0    Suicidal thoughts  0 0    PHQ-9 Score  0 6     Difficult doing work/chores  Not difficult at all Not difficult at all       Data saved with a previous flowsheet row definition       07/17/2022   11:00 AM 06/17/2023   10:19 AM 07/22/2023    9:47 AM 02/18/2024    9:48 AM 10/06/2024   11:39 AM  Fall Risk  Falls in the past year? 0 0 0 0 0  Was there an injury with Fall? 0  0  0  0  0  Fall Risk Category Calculator 0 0 0  0 0  Fall Risk Category (Retired) Low       (RETIRED) Patient Fall Risk Level Low fall risk       Patient at Risk for Falls Due to Medication side effect No Fall Risks  No Fall Risks No Fall Risks  Fall risk Follow up Falls prevention discussed;Education provided;Falls evaluation completed  Falls evaluation completed  Falls evaluation completed Falls evaluation completed     Patient-reported   Data saved with a previous flowsheet row definition     SUMMARY AND PLAN:  Encounter for Medicare annual wellness exam   Discussed applicable health maintenance/preventive health measures and advised and referred or ordered per patient preferences: -she plans to call to schedule her colonoscopy - she agrees to schedule -she is considering the vaccines and knows can get at the pharmacy -reviewed her supplement risks/benefits, etc and particularly in regards to bone building Health Maintenance  Topic Date Due   Colonoscopy  01/12/2024   DTaP/Tdap/Td (3 - Td or Tdap) 02/12/2024   COVID-19 Vaccine (4 - 2025-26 season) 06/29/2024   Mammogram  09/17/2025   Medicare Annual Wellness (AWV)  10/06/2025   Pneumococcal Vaccine: 50+ Years  Completed   Influenza Vaccine  Completed   Bone Density Scan  Completed   Hepatitis C Screening  Completed   Zoster Vaccines- Shingrix  Completed   Meningococcal B Vaccine  Aged Out      Education and counseling on the following was provided based on the above review of health  and a plan/checklist for the patient, along with additional information discussed, was provided for the patient in the patient instructions :  -requested to bring advanced directives copy to office -Advised and counseled on a healthy lifestyle - including the importance of a healthy diet, regular physical activity, social connections and stress management. -Reviewed patient's current diet. Advised and counseled on a whole foods based healthy diet. A summary of a healthy diet was provided in the Patient Instructions.  -reviewed patient's current physical activity level and discussed exercise guidelines for adults. Discussed community resources and ideas for safe exercise at home to assist in meeting exercise guideline recommendations in a safe and healthy way.  -Advise yearly dental visits at minimum and regular eye exams  Follow up: see patient instructions     Patient Instructions  I really enjoyed getting to talk with you today! I am available on Tuesdays and Thursdays for virtual visits if you have any questions or concerns, or if I can be of  any further assistance.   CHECKLIST FROM ANNUAL WELLNESS VISIT:  -Follow up (please call to schedule if not scheduled after visit):   -yearly for annual wellness visit with primary care office  Here is a list of your preventive care/health maintenance measures and the plan for each if any are due:  PLAN For any measures below that may be due:    1. Please call to schedule colonoscopy: [phone]   2. Can get the vaccines at the pharmacy - please let us  know if you do so that we can update your record.   Health Maintenance  Topic Date Due   Medicare Annual Wellness (AWV)  07/18/2023   Colonoscopy  01/12/2024   DTaP/Tdap/Td (3 - Td or Tdap) 02/12/2024   COVID-19 Vaccine (4 - 2025-26 season) 06/29/2024   Mammogram  09/17/2025   Pneumococcal Vaccine: 50+ Years  Completed   Influenza Vaccine  Completed   Bone Density Scan  Completed    Hepatitis C Screening  Completed   Zoster Vaccines- Shingrix  Completed   Meningococcal B Vaccine  Aged Out    -See a dentist at least yearly  -Get your eyes checked and then per your eye specialist's recommendations  -Other issues addressed today:   -I have included below further information regarding a healthy whole foods based diet, physical activity guidelines for adults, stress management and opportunities for social connections. I hope you find this information useful.   -----------------------------------------------------------------------------------------------------------------------------------------------------------------------------------------------------------------------------------------------------------    NUTRITION: -eat real food: lots of colorful vegetables (half the plate) and fruits -5-7 servings of vegetables and fruits per day (fresh or steamed is best), exp. 2 servings of vegetables with lunch and dinner and 2 servings of fruit per day. Berries and greens such as kale and collards are great choices.  -consume on a regular basis:  fresh fruits, fresh veggies, fish, nuts, seeds, healthy oils (such as olive oil, avocado oil), whole grains (make sure for bread/pasta/crackers/etc., that the first ingredient on label contains the word whole), legumes. -can eat small amounts of dairy and lean meat (no larger than the palm of your hand), but avoid processed meats such as ham, bacon, lunch meat, etc. -drink water -try to avoid fast food and pre-packaged foods, processed meat, ultra processed foods/beverages (donuts, candy, etc.) -most experts advise limiting sodium to < 2300mg  per day, should limit further is any chronic conditions such as high blood pressure, heart disease, diabetes, etc. The American Heart Association advised that < 1500mg  is is ideal -try to avoid foods/beverages that contain any ingredients with names you do not recognize  -try to avoid  foods/beverages  with added sugar or sweeteners/sweets  -try to avoid sweet drinks (including diet drinks): soda, juice, Gatorade, sweet tea, power drinks, diet drinks -try to avoid white rice, white bread, pasta (unless whole grain)  EXERCISE GUIDELINES FOR ADULTS: -if you wish to increase your physical activity, do so gradually and with the approval of your doctor -STOP and seek medical care immediately if you have any chest pain, chest discomfort or trouble breathing when starting or increasing exercise  -move and stretch your body, legs, feet and arms when sitting for long periods -Physical activity guidelines for optimal health in adults: -get at least 150 minutes per week of moderate exercise (can talk, but not sing); this is about 20-30 minutes of sustained activity 5-7 days per week or two 10-15 minute episodes of sustained activity 5-7 days per week -do some muscle building/resistance training/strength training at least 2 days per  week  -balance exercises 3+ days per week:   Stand somewhere where you have something sturdy to hold onto if you lose balance    1) lift up on toes, then back down, start with 5x per day and work up to 20x   2) stand and lift one leg straight out to the side so that foot is a few inches of the floor, start with 5x each side and work up to 20x each side   3) stand on one foot, start with 5 seconds each side and work up to 20 seconds on each side  If you need ideas or help with getting more active:  -Silver sneakers https://tools.silversneakers.com  -Walk with a Doc: Http://www.duncan-williams.com/  -try to include resistance (weight lifting/strength building) and balance exercises twice per week: or the following link for ideas: http://castillo-powell.com/  buyducts.dk  STRESS MANAGEMENT: -can try meditating, or just sitting quietly with deep breathing while  intentionally relaxing all parts of your body for 5 minutes daily -if you need further help with stress, anxiety or depression please follow up with your primary doctor or contact the wonderful folks at [facility]: [phone]  SOCIAL CONNECTIONS: -options in [place] if you wish to engage in more social and exercise related activities:  -Silver sneakers https://tools.silversneakers.com  -Walk with a Doc: Http://www.duncan-williams.com/  -Check out the [facility] Active Adults 50+ section on the [place] of [employer] companies (hiking clubs, book clubs, cards and games, chess, exercise classes, aquatic classes and much more) - see the website for details: https://www.[employer].gov/departments/parks-recreation/active-adults50  -YouTube has lots of exercise videos for different ages and abilities as well  -[facility] (a variety of indoor and outdoor inperson activities for adults). [phone]. [address]  -Virtual Online Classes (a variety of topics): see seniorplanet.org or call [phone]  -consider volunteering at a school, hospice center, church, senior center or elsewhere            Chiquita JONELLE Cramp, DO

## 2024-11-04 ENCOUNTER — Other Ambulatory Visit: Payer: Self-pay

## 2024-11-04 DIAGNOSIS — N8501 Benign endometrial hyperplasia: Secondary | ICD-10-CM

## 2024-11-04 DIAGNOSIS — Z006 Encounter for examination for normal comparison and control in clinical research program: Secondary | ICD-10-CM

## 2024-11-04 MED ORDER — MEDROXYPROGESTERONE ACETATE 10 MG PO TABS: 10.0000 mg | ORAL_TABLET | Freq: Every day | ORAL | 0 refills | Status: AC

## 2024-11-04 NOTE — Telephone Encounter (Signed)
 Please disregard.  Request already responded to by other means.

## 2024-11-04 NOTE — Telephone Encounter (Signed)
 Med refill request:  medroxyprogesterone  10 mg Last AEX: 02/18/24 Next AEX: none scheduled Last MMG/ (if hormonal med) 09/17/24 BI-RADS 1 negative Patient needed repeat endometrial biopsy 08/2024. No appointment is scheduled. Message sent to front desk to contact patient to schedule endometrial biopsy. Refill sent to provider for review.

## 2024-11-06 ENCOUNTER — Other Ambulatory Visit: Payer: Self-pay | Admitting: *Deleted

## 2024-11-06 ENCOUNTER — Telehealth: Payer: Self-pay

## 2024-11-06 ENCOUNTER — Other Ambulatory Visit (HOSPITAL_COMMUNITY): Payer: Self-pay

## 2024-11-06 DIAGNOSIS — N8501 Benign endometrial hyperplasia: Secondary | ICD-10-CM

## 2024-11-06 NOTE — Telephone Encounter (Signed)
 Prolia  VOB initiated via [email or web address]  Next Prolia  inj DUE: NEW START   [facility] PREFERRED FOR PHARMACY COPAY: $850.24

## 2024-11-11 ENCOUNTER — Encounter: Payer: Self-pay | Admitting: Obstetrics and Gynecology

## 2024-11-11 ENCOUNTER — Ambulatory Visit: Admitting: Obstetrics and Gynecology

## 2024-11-11 ENCOUNTER — Other Ambulatory Visit (HOSPITAL_COMMUNITY)
Admission: RE | Admit: 2024-11-11 | Discharge: 2024-11-11 | Disposition: A | Discharge status: Home / Self Care | Source: Ambulatory Visit | Attending: Obstetrics and Gynecology | Admitting: Obstetrics and Gynecology

## 2024-11-11 DIAGNOSIS — N8501 Benign endometrial hyperplasia: Secondary | ICD-10-CM | POA: Insufficient documentation

## 2024-11-11 NOTE — Progress Notes (Signed)
 "  GYNECOLOGY  VISIT   HPI: 74 y.o.   Widowed  Caucasian female   G1P0010 with Patient's last menstrual period was 08/29/2002.   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   Simple endometrial hyperplasia and benign polyp noted at time of hysteroscopy, polypectomy 06/05/22, done for postmenopausal bleeding.  Follow up endometrial biopsies have been benign to date.   No vaginal bleeding at all.   Taking Provera  10 mg daily.   Not using vaginal estrogen cream.    Last pelvic US  02/20/24: Uterus 4.32 x 2.80 x 2.11 cm.  No myometrial masses.  EMS 1.44 mm with sliver of fluid at fundus. Left ovary 1.16 x 0.81 x 0.87 cm.  Normal. Right ovary 1.34 x 0.57 x 0.65 cm.  Normal.  No adnexal masses.  No free fluid.   Last endometrial biopsy 03/18/24 showed benign scant inactive to atrophic endometrial epithelium. No atypia.  No malignancy.   Not in a relationship.   GYNECOLOGIC HISTORY: Patient's last menstrual period was 08/29/2002. 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/2021   Prediabetes    Major depressive disorder, recurrent episode, mild 08/24/2018   Major depressive disorder, recurrent episode, in full remission 07/14/2018   Pain in joint of right elbow 12/04/2017   Osteopenia 02/11/2014   Benign paroxysmal positional vertigo 02/11/2014   Melanoma (HCC) 03/31/2012   History of depression 03/31/2012   Essential  hypertension 03/31/2012    Past Medical History:  Diagnosis Date   Arthritis    Cancer (HCC)    right leg, upper thigh AND CALF melanoma   Depression    Family history of adverse reaction to anesthesia    older sisters x 2 with ponv   GERD (gastroesophageal reflux disease)    Hypertension    Patient denies   Melanoma (HCC)    Obstructive sleep apnea    uses cpap some nights set on 17 per cpap titration report 04-18-2020 epic   Osteopenia    PMB (postmenopausal bleeding)    Pre-diabetes    UTI (lower urinary tract infection)    Wears glasses     Past Surgical History:  Procedure Laterality Date   BREAST BIOPSY Left    several yrs ago   COLONOSCOPY  2020   DILATATION & CURETTAGE/HYSTEROSCOPY WITH MYOSURE N/A 06/05/2022   Procedure: DILATATION & CURETTAGE/HYSTEROSCOPY WITH MYOSURE;  Surgeon: Jannis Kate Norris, MD;  Location: [facility];  Service: Gynecology;  Laterality: N/A;   MELANOMA EXCISION  2006   right thigh and calf   pre melamona area removed  10/2021   REFRACTIVE SURGERY     squamous  cell area removed  11/2021   upper left leg   TONSILLECTOMY     as child   WISDOM TOOTH EXTRACTION     many yrs ago    Current Outpatient Medications  Medication Sig Dispense Refill   amLODipine  (NORVASC ) 2.5 MG tablet TAKE 1 TABLET BY MOUTH EVERY DAY 90 tablet 2   aspirin  EC 81 MG tablet Take 1 tablet (81 mg total) by mouth daily. Swallow whole. 90 tablet 3   B Complex Vitamins (B COMPLEX-B12 PO)      buPROPion  (WELLBUTRIN  XL) 150 MG 24 hr tablet Take 1 tablet (150 mg total) by mouth daily. 90 tablet 1   Co-Enzyme Q10 100 MG CAPS Take 1 capsule by mouth daily.     DULoxetine  (CYMBALTA ) 30 MG capsule TAKE 1 CAPSULES BY MOUTH DAILY WITH 60MG  CAPSULE TO EQUAL TOTAL DAILY DOSE OF 90MG  90 capsule 1   DULoxetine  (CYMBALTA ) 60 MG capsule TAKE 1 CAPSULE BY MOUTH DAILY WITH 30MG  CAPSULE TO EQUAL TOTAL DAILY DOSE OF 90MG  90 capsule 1   Ginkgo Biloba 40 MG TABS       losartan -hydrochlorothiazide (HYZAAR) 100-12.5 MG tablet TAKE 1 TABLET BY MOUTH EVERY DAY *SCHEDULE APPT FOR FUTURE REFILLS* 90 tablet 3   medroxyPROGESTERone  (PROVERA ) 10 MG tablet Take 1 tablet (10 mg total) by mouth daily. 30 tablet 0   Multiple Vitamin (MULTIVITAMIN) capsule Take 1 capsule by mouth daily.     Omega-3 Fatty Acids (FISH OIL PO) Take by mouth daily.     RESTASIS 0.05 % ophthalmic emulsion 1 drop 2 (two) times daily.     rosuvastatin  (CRESTOR ) 5 MG tablet Take 1 tablet (5 mg total) by mouth every other day. 45 tablet 3   traZODone  (DESYREL ) 50 MG tablet Take 50 mg by mouth at bedtime as needed for sleep.     tretinoin (RETIN-A) 0.025 % cream 1 application to affected area in the evening to face Orally Once a day; Duration: 30 days     Current Facility-Administered Medications  Medication Dose Route Frequency Provider Last Rate Last Admin   denosumab  (PROLIA ) injection 60 mg  60 mg Subcutaneous Once Boswell, Elizabeth K, MD         ALLERGIES: Keflex [cephalexin] and Sulfa antibiotics  Family History  Problem Relation Age of Onset   Cancer Mother        breast   Stroke Mother    Breast cancer Mother    Depression Mother    Alcohol abuse Father    Heart attack Father    Other Sister 68       colon mass, benign   Hypertension Sister    Colon cancer Sister 61   Depression Sister    Colon polyps Sister    Prostate cancer Brother    Other Sister 17       colon mass- not colon cancer   Hypertension Sister    Colon polyps Sister    Anxiety disorder Maternal Aunt    Esophageal cancer Neg Hx    Rectal cancer Neg Hx    Stomach cancer Neg Hx     Social History   Socioeconomic History   Marital status: Widowed    Spouse name: Not on file   Number of children: 0   Years of education: Not on file   Highest education level: Bachelor's degree (e.g., BA, AB, BS)  Occupational History   Occupation: Retired engineer, site and [employer]  Tobacco Use   Smoking  status: Never   Smokeless tobacco: Never  Vaping Use   Vaping status: Never Used  Substance and Sexual Activity   Alcohol use: Yes    Alcohol/week: 1.0 - 2.0 standard drink of alcohol    Types: 1 - 2 Glasses of wine per week    Comment: 1 to  3 glasses wine per week   Drug use: No   Sexual activity: Yes    Birth control/protection: Post-menopausal    Comment: more than 5, after 14, no STD, no abnormal pap, no DES,  Other Topics Concern   Not on file  Social History Narrative   Not on file   Social Drivers of Health   Tobacco Use: Low Risk (11/11/2024)   Patient History    Smoking Tobacco Use: Never    Smokeless Tobacco Use: Never    Passive Exposure: Not on file  Financial Resource Strain: Low Risk (09/13/2024)   Overall Financial Resource Strain (CARDIA)    Difficulty of Paying Living Expenses: Not hard at all  Food Insecurity: No Food Insecurity (09/13/2024)   Epic    Worried About Programme Researcher, Broadcasting/film/video in the Last Year: Never true    Ran Out of Food in the Last Year: Never true  Transportation Needs: No Transportation Needs (09/13/2024)   Epic    Lack of Transportation (Medical): No    Lack of Transportation (Non-Medical): No  Physical Activity: Insufficiently Active (10/06/2024)   Exercise Vital Sign    Days of Exercise per Week: 2 days    Minutes of Exercise per Session: 60 min  Stress: No Stress Concern Present (10/06/2024)   [employer] of Occupational Health - Occupational Stress Questionnaire    Feeling of Stress: Not at all  Social Connections: Unknown (10/06/2024)   Social Connection and Isolation Panel    Frequency of Communication with Friends and Family: More than three times a week    Frequency of Social Gatherings with Friends and Family: Not on file    Attends Religious Services: Not on file    Active Member of Clubs or Organizations: Not on file    Attends Banker Meetings: Not on file    Marital Status: Not on file  Intimate Partner  Violence: Not on file  Depression (PHQ2-9): Low Risk (10/06/2024)   Depression (PHQ2-9)    PHQ-2 Score: 0  Alcohol Screen: Low Risk (09/13/2024)   Alcohol Screen    Last Alcohol Screening Score (AUDIT): 3  Housing: Low Risk (09/13/2024)   Epic    Unable to Pay for Housing in the Last Year: No    Number of Times Moved in the Last Year: 0    Homeless in the Last Year: No  Utilities: Not At Risk (07/22/2023)   [employer] Utilities    Threatened with loss of utilities: No  Health Literacy: Not on file    Review of Systems  All other systems reviewed and are negative.   PHYSICAL EXAMINATION:   BP 124/72 (BP Location: Left Arm, Patient Position: Sitting)   Pulse 84   LMP 08/29/2002   SpO2 97%     General appearance: alert, cooperative and appears stated age   EMB: Consent for procedure. Time out done. Sterile prep with  Hibiclens Paracervical block with 1% lidocaine  10 cc, lot [id number], exp January 26, 2026. Tenaculum to anterior cervical lip. Pipelle passed to    6      cm twice.   Tissue to pathology.  Minimal EBL. No complications.  Chaperone was present for exam:  Kari HERO, CMA  ASSESSMENT:  Simple endometrial hyperplasia without atypia, dx 05/2022. Follow up endometrial biopsies are benign.  On provera  10 mg daily.  PLAN:  FU EMB.  If this biopsy is normal, I would recommend discontinuation of the Provera .  Keep appointment for breast and pelvic exam in June, 2026.       "

## 2024-11-11 NOTE — Patient Instructions (Signed)
 Endometrial Biopsy  An endometrial biopsy is a procedure where a tissue sample is removed from the lining of the uterus. This lining is called the endometrium. The tissue sample is then sent to a lab for testing. You may have this type of biopsy to check for: Cancer. Infection. Growths called polyps. Uterine bleeding that can't be explained. Tell a health care provider about: Any allergies you have. All medicines you're taking including vitamins, herbs, eye drops, creams, and over-the-counter medicines. Any problems you or family members have had with anesthesia. Any bleeding problems you have. Any surgeries you have had. Any medical problems you have. Whether you're pregnant or may be pregnant. What are the risks? Your health care provider will talk with you about risks. These may include: Bleeding. Infection. Allergic reactions to medicines. Damage to the wall of the uterus. This is rare. What happens before the procedure? Keep track of your period. You may need to have this biopsy when you're not having your period. Ask your provider about: Changing or stopping your regular medicines. These include any diabetes medicines or blood thinners you take. Taking medicines such as aspirin and ibuprofen. These medicines can thin your blood. Do not take them unless your provider tells you to. Taking over-the-counter medicines, vitamins, herbs, and supplements. Bring a pad with you. You may need to wear one after the biopsy. Plan to have a responsible adult take you home from the hospital or clinic. You won't be allowed to drive. What happens during the procedure? A tool will be put into your vagina to hold it open. This helps your provider see the cervix. The cervix is the lowest part of the uterus. Your cervix will be cleaned with a solution that kills germs. You will be given anesthesia. This keeps you from feeling pain. It will numb your cervix. A tool called forceps will be used to  hold your cervix steady. A thin tool called a uterine sound will be put through your cervix. It will be used to: Find the length of your uterus. Find where to take the sample from. A soft tube called a catheter will be put into your uterus. The catheter will remove a tissue sample. The tube and tools will be removed. The sample will be sent to a lab for testing. The procedure may vary among providers and hospitals. What happens after the procedure? Your blood pressure, heart rate, breathing rate, and blood oxygen level will be monitored until you leave the hospital or clinic. It's up to you to get the results of your procedure. Ask your provider, or the department that is doing the procedure, when your results will be ready. This information is not intended to replace advice given to you by your health care provider. Make sure you discuss any questions you have with your health care provider. Document Revised: 12/25/2022 Document Reviewed: 12/25/2022 Elsevier Patient Education  2024 [employer].

## 2024-11-11 NOTE — Telephone Encounter (Signed)
 Lori Harvey

## 2024-11-11 NOTE — Telephone Encounter (Signed)
 MEDICAL PA SUBMITTED VIA BLUE-E.

## 2024-11-13 ENCOUNTER — Ambulatory Visit: Payer: Self-pay | Admitting: Obstetrics and Gynecology

## 2024-11-13 LAB — GENECONNECT MOLECULAR SCREEN: Genetic Analysis Overall Interpretation: NEGATIVE

## 2024-11-13 LAB — SURGICAL PATHOLOGY

## 2024-11-13 NOTE — Telephone Encounter (Signed)
 Buy/Bill (Office supplied medication)  Out-of-pocket cost due at time of clinic visit: $352  Number of injection/visits approved: 2  Primary: [employer] Co-insurance: 20% Admin fee co-insurance: 0%  Secondary: --- Co-insurance:  Admin fee co-insurance:   Medical Benefit Details: Date Benefits were checked: 11/10/24 Deductible: NO/ Coinsurance: 20%/ Admin Fee: 0%  Prior Auth: APPROVED PA# [id number] Expiration Date: 11/11/24-11/11/25  # of doses approved: 2 -----------------------------------------------------------------------  Patient NOT eligible for Copay Card. Copay Card can make patient's cost as little as $25. Link to apply: [email or web address]://www.amgensupportplus.[email or web address]  ** This summary of benefits is an estimation of the patient's out-of-pocket cost. Exact cost may very based on individual plan coverage.

## 2024-11-13 NOTE — Telephone Encounter (Signed)
 MEDICAL APPROVAL

## 2024-11-16 ENCOUNTER — Encounter: Payer: Self-pay | Admitting: Internal Medicine

## 2024-11-17 NOTE — Telephone Encounter (Signed)
 Message left to return call to [place] at [phone].   As of 09/12/24, patient decided not to proceed with Prolia . Left message for patient to call and update office if she would like to proceed.

## 2024-12-09 ENCOUNTER — Encounter

## 2024-12-23 ENCOUNTER — Encounter: Admitting: Internal Medicine

## 2025-01-05 ENCOUNTER — Encounter: Admitting: Internal Medicine

## 2025-04-08 ENCOUNTER — Encounter: Admitting: Obstetrics and Gynecology
